# Patient Record
Sex: Male | Born: 1940 | Race: White | Hispanic: No | Marital: Married | Smoking: Former smoker
Health system: Southern US, Community
[De-identification: ages and names within clinical notes are randomized; demographics above are authoritative.]

## PROBLEM LIST (undated history)

## (undated) DIAGNOSIS — K449 Diaphragmatic hernia without obstruction or gangrene: Secondary | ICD-10-CM

## (undated) DIAGNOSIS — Z8719 Personal history of other diseases of the digestive system: Secondary | ICD-10-CM

## (undated) DIAGNOSIS — K2270 Barrett's esophagus without dysplasia: Secondary | ICD-10-CM

## (undated) DIAGNOSIS — E785 Hyperlipidemia, unspecified: Secondary | ICD-10-CM

## (undated) DIAGNOSIS — C4490 Unspecified malignant neoplasm of skin, unspecified: Secondary | ICD-10-CM

## (undated) DIAGNOSIS — Z9889 Other specified postprocedural states: Secondary | ICD-10-CM

## (undated) DIAGNOSIS — K635 Polyp of colon: Secondary | ICD-10-CM

## (undated) DIAGNOSIS — K219 Gastro-esophageal reflux disease without esophagitis: Secondary | ICD-10-CM

## (undated) DIAGNOSIS — Z923 Personal history of irradiation: Secondary | ICD-10-CM

## (undated) DIAGNOSIS — M1990 Unspecified osteoarthritis, unspecified site: Secondary | ICD-10-CM

## (undated) DIAGNOSIS — I219 Acute myocardial infarction, unspecified: Secondary | ICD-10-CM

## (undated) DIAGNOSIS — N200 Calculus of kidney: Secondary | ICD-10-CM

## (undated) DIAGNOSIS — I2510 Atherosclerotic heart disease of native coronary artery without angina pectoris: Secondary | ICD-10-CM

## (undated) DIAGNOSIS — C801 Malignant (primary) neoplasm, unspecified: Secondary | ICD-10-CM

## (undated) DIAGNOSIS — I10 Essential (primary) hypertension: Secondary | ICD-10-CM

## (undated) HISTORY — DX: Gastro-esophageal reflux disease without esophagitis: K21.9

## (undated) HISTORY — DX: Other specified postprocedural states: Z98.890

## (undated) HISTORY — DX: Atherosclerotic heart disease of native coronary artery without angina pectoris: I25.10

## (undated) HISTORY — DX: Diaphragmatic hernia without obstruction or gangrene: K44.9

## (undated) HISTORY — DX: Polyp of colon: K63.5

## (undated) HISTORY — PX: COLONOSCOPY: SHX174

## (undated) HISTORY — DX: Personal history of irradiation: Z92.3

## (undated) HISTORY — DX: Malignant (primary) neoplasm, unspecified: C80.1

## (undated) HISTORY — DX: Hyperlipidemia, unspecified: E78.5

## (undated) HISTORY — PX: EXTERNAL EAR SURGERY: SHX627

## (undated) HISTORY — DX: Barrett's esophagus without dysplasia: K22.70

## (undated) HISTORY — DX: Acute myocardial infarction, unspecified: I21.9

## (undated) HISTORY — PX: OTHER SURGICAL HISTORY: SHX169

## (undated) HISTORY — DX: Calculus of kidney: N20.0

## (undated) HISTORY — DX: Personal history of other diseases of the digestive system: Z87.19

## (undated) HISTORY — DX: Unspecified osteoarthritis, unspecified site: M19.90

---

## 2003-04-24 DIAGNOSIS — I219 Acute myocardial infarction, unspecified: Secondary | ICD-10-CM

## 2003-04-24 DIAGNOSIS — I2510 Atherosclerotic heart disease of native coronary artery without angina pectoris: Secondary | ICD-10-CM

## 2003-04-24 HISTORY — DX: Acute myocardial infarction, unspecified: I21.9

## 2003-04-24 HISTORY — DX: Atherosclerotic heart disease of native coronary artery without angina pectoris: I25.10

## 2003-04-24 HISTORY — PX: ANGIOPLASTY: SHX39

## 2005-01-29 ENCOUNTER — Ambulatory Visit: Payer: Self-pay | Admitting: Internal Medicine

## 2005-02-08 ENCOUNTER — Encounter (INDEPENDENT_AMBULATORY_CARE_PROVIDER_SITE_OTHER): Payer: Self-pay | Admitting: Specialist

## 2005-02-08 ENCOUNTER — Ambulatory Visit: Payer: Self-pay | Admitting: Internal Medicine

## 2005-02-08 ENCOUNTER — Encounter (INDEPENDENT_AMBULATORY_CARE_PROVIDER_SITE_OTHER): Payer: Self-pay | Admitting: *Deleted

## 2005-02-23 ENCOUNTER — Ambulatory Visit: Payer: Self-pay | Admitting: Internal Medicine

## 2005-02-23 ENCOUNTER — Ambulatory Visit (HOSPITAL_COMMUNITY): Admission: RE | Admit: 2005-02-23 | Discharge: 2005-02-23 | Payer: Self-pay | Admitting: Internal Medicine

## 2005-03-30 ENCOUNTER — Ambulatory Visit (HOSPITAL_COMMUNITY): Admission: RE | Admit: 2005-03-30 | Discharge: 2005-03-30 | Payer: Self-pay | Admitting: Internal Medicine

## 2005-03-30 ENCOUNTER — Ambulatory Visit: Payer: Self-pay | Admitting: Internal Medicine

## 2005-05-04 ENCOUNTER — Encounter (INDEPENDENT_AMBULATORY_CARE_PROVIDER_SITE_OTHER): Payer: Self-pay | Admitting: Specialist

## 2005-05-04 ENCOUNTER — Ambulatory Visit (HOSPITAL_COMMUNITY): Admission: RE | Admit: 2005-05-04 | Discharge: 2005-05-04 | Payer: Self-pay | Admitting: Internal Medicine

## 2005-05-04 ENCOUNTER — Ambulatory Visit: Payer: Self-pay | Admitting: Internal Medicine

## 2005-06-12 ENCOUNTER — Ambulatory Visit: Payer: Self-pay | Admitting: Internal Medicine

## 2006-06-12 ENCOUNTER — Ambulatory Visit: Payer: Self-pay | Admitting: Internal Medicine

## 2006-07-05 ENCOUNTER — Encounter (INDEPENDENT_AMBULATORY_CARE_PROVIDER_SITE_OTHER): Payer: Self-pay | Admitting: Specialist

## 2006-07-05 ENCOUNTER — Ambulatory Visit: Payer: Self-pay | Admitting: Internal Medicine

## 2006-11-21 ENCOUNTER — Other Ambulatory Visit: Payer: Self-pay

## 2006-11-22 ENCOUNTER — Inpatient Hospital Stay: Payer: Self-pay | Admitting: Internal Medicine

## 2006-11-22 ENCOUNTER — Other Ambulatory Visit: Payer: Self-pay

## 2006-11-23 ENCOUNTER — Other Ambulatory Visit: Payer: Self-pay

## 2006-11-24 ENCOUNTER — Other Ambulatory Visit: Payer: Self-pay

## 2006-12-17 ENCOUNTER — Inpatient Hospital Stay: Payer: Self-pay | Admitting: Cardiovascular Disease

## 2006-12-17 ENCOUNTER — Other Ambulatory Visit: Payer: Self-pay

## 2008-02-04 ENCOUNTER — Ambulatory Visit: Payer: Self-pay

## 2008-03-25 ENCOUNTER — Telehealth: Payer: Self-pay | Admitting: Internal Medicine

## 2008-04-01 ENCOUNTER — Encounter (INDEPENDENT_AMBULATORY_CARE_PROVIDER_SITE_OTHER): Payer: Self-pay | Admitting: *Deleted

## 2008-04-01 DIAGNOSIS — K449 Diaphragmatic hernia without obstruction or gangrene: Secondary | ICD-10-CM | POA: Insufficient documentation

## 2008-04-01 DIAGNOSIS — Z85828 Personal history of other malignant neoplasm of skin: Secondary | ICD-10-CM

## 2008-04-01 DIAGNOSIS — Z87442 Personal history of urinary calculi: Secondary | ICD-10-CM

## 2008-04-01 DIAGNOSIS — K2270 Barrett's esophagus without dysplasia: Secondary | ICD-10-CM

## 2008-04-01 DIAGNOSIS — K209 Esophagitis, unspecified without bleeding: Secondary | ICD-10-CM | POA: Insufficient documentation

## 2008-04-02 ENCOUNTER — Ambulatory Visit: Payer: Self-pay | Admitting: Internal Medicine

## 2008-04-02 DIAGNOSIS — K219 Gastro-esophageal reflux disease without esophagitis: Secondary | ICD-10-CM | POA: Insufficient documentation

## 2008-04-02 DIAGNOSIS — Z860100 Personal history of colon polyps, unspecified: Secondary | ICD-10-CM | POA: Insufficient documentation

## 2008-04-02 DIAGNOSIS — I2510 Atherosclerotic heart disease of native coronary artery without angina pectoris: Secondary | ICD-10-CM | POA: Insufficient documentation

## 2008-06-17 ENCOUNTER — Ambulatory Visit: Payer: Self-pay | Admitting: Otolaryngology

## 2008-06-24 ENCOUNTER — Encounter (INDEPENDENT_AMBULATORY_CARE_PROVIDER_SITE_OTHER): Payer: Self-pay | Admitting: *Deleted

## 2008-07-09 ENCOUNTER — Ambulatory Visit: Payer: Self-pay | Admitting: Otolaryngology

## 2008-07-15 ENCOUNTER — Ambulatory Visit: Payer: Self-pay | Admitting: Otolaryngology

## 2008-11-02 ENCOUNTER — Telehealth: Payer: Self-pay | Admitting: Internal Medicine

## 2008-11-02 ENCOUNTER — Encounter: Payer: Self-pay | Admitting: Internal Medicine

## 2008-11-19 ENCOUNTER — Ambulatory Visit: Payer: Self-pay | Admitting: Otolaryngology

## 2008-11-26 ENCOUNTER — Ambulatory Visit: Payer: Self-pay | Admitting: Otolaryngology

## 2008-12-10 ENCOUNTER — Ambulatory Visit: Payer: Self-pay | Admitting: Internal Medicine

## 2008-12-20 ENCOUNTER — Ambulatory Visit: Payer: Self-pay | Admitting: Internal Medicine

## 2008-12-20 ENCOUNTER — Encounter: Payer: Self-pay | Admitting: Internal Medicine

## 2008-12-21 ENCOUNTER — Encounter: Payer: Self-pay | Admitting: Internal Medicine

## 2009-02-03 ENCOUNTER — Ambulatory Visit: Payer: Self-pay | Admitting: Otolaryngology

## 2009-04-23 DIAGNOSIS — Z923 Personal history of irradiation: Secondary | ICD-10-CM

## 2009-04-23 HISTORY — DX: Personal history of irradiation: Z92.3

## 2009-09-17 ENCOUNTER — Telehealth (INDEPENDENT_AMBULATORY_CARE_PROVIDER_SITE_OTHER): Payer: Self-pay | Admitting: Physician Assistant

## 2009-10-21 ENCOUNTER — Ambulatory Visit: Payer: Self-pay | Admitting: Radiation Oncology

## 2009-11-08 ENCOUNTER — Ambulatory Visit: Payer: Self-pay | Admitting: Radiation Oncology

## 2009-11-09 ENCOUNTER — Ambulatory Visit: Payer: Self-pay | Admitting: Otolaryngology

## 2009-11-10 ENCOUNTER — Ambulatory Visit: Payer: Self-pay | Admitting: Otolaryngology

## 2009-11-14 LAB — PATHOLOGY REPORT

## 2009-11-21 ENCOUNTER — Ambulatory Visit: Payer: Self-pay | Admitting: Radiation Oncology

## 2009-11-28 ENCOUNTER — Ambulatory Visit: Payer: Self-pay | Admitting: Radiation Oncology

## 2009-12-22 ENCOUNTER — Ambulatory Visit: Payer: Self-pay | Admitting: Radiation Oncology

## 2010-01-21 ENCOUNTER — Ambulatory Visit: Payer: Self-pay | Admitting: Radiation Oncology

## 2010-02-21 ENCOUNTER — Ambulatory Visit: Payer: Self-pay | Admitting: Radiation Oncology

## 2010-03-23 ENCOUNTER — Ambulatory Visit: Payer: Self-pay | Admitting: Radiation Oncology

## 2010-03-23 ENCOUNTER — Ambulatory Visit: Payer: Self-pay | Admitting: Oncology

## 2010-04-23 ENCOUNTER — Ambulatory Visit: Payer: Self-pay | Admitting: Radiation Oncology

## 2010-04-23 ENCOUNTER — Ambulatory Visit: Payer: Self-pay | Admitting: Oncology

## 2010-05-08 ENCOUNTER — Other Ambulatory Visit: Payer: Self-pay | Admitting: Dermatology

## 2010-05-24 HISTORY — PX: NECK DISSECTION: SUR422

## 2010-05-25 NOTE — Progress Notes (Signed)
  Phone Note Call from Patient   Call For: Dr Simona Huh Reason for Call: Talk to Doctor Details for Reason: Letter from hospital  Summary of Call: Pt received a letter from [facility] asking him questions about his heart failure. He didn't know he had heart failure and was concerned. He had also had CP after exertion on right. Reassured him that cath was without sig. blockage. Reassured him that EF was nl and no diastolic dysfunction on echo. I reassured him that I cannot find a reason for him to have gotten that letter. He was very anxious about it. Reassured him. No further questions. Initial call taken by: Lavella Hammock Barrett PA-C,  Sep 17, 2009 12:50 PM     Appended Document:  It appears that this note was sent to me in error.  I was not the physician on call on May 28 when this phone call was taken by Ms. Barrett.  EMR review does not show any cardiology office visits for this patient, and for that matter I do not see any cardiology evaluation at [facility].  There is a GI note by Dr. Marina Goodell from 12/09 indicating that the patient had a coronary stent placed in April 2009, presumably under the care of Dr. Harold Hedge at the [facility] in [place] based on the cc list.  I placed a call to our [place] office on 6/1 and asked Harriett Sine, the office administrator, to check with that practice, and relay this information to them for purposes of followup.  I also attempted to reach Ms. Raford Pitcher to ask her more about the call, however she is on vacation this week.  Appended Document:  Note was put on the wrong patient. Should have been Brandon Clements [birth year] (also 46), not Brandon Clements and to Dr Dietrich Pates, not Dr Diona Browner. Sorry for the confusion.

## 2010-08-23 ENCOUNTER — Ambulatory Visit: Payer: Self-pay | Admitting: Otolaryngology

## 2010-08-24 ENCOUNTER — Ambulatory Visit: Payer: Self-pay | Admitting: Otolaryngology

## 2010-08-28 LAB — PATHOLOGY REPORT

## 2010-08-31 ENCOUNTER — Ambulatory Visit: Payer: Self-pay | Admitting: Oncology

## 2010-09-08 NOTE — Assessment & Plan Note (Signed)
[facility]                         GASTROENTEROLOGY OFFICE NOTE   STYLES, FAMBRO                      MRN:          [id number]  DATE:06/12/2006                            DOB:          1940/11/19    HISTORY:  This is a 70 year old with a history of gastroesophageal  reflux disease complicated by long segment Barrett's esophagus, as well  as a symptomatic peptic stricture, for which he underwent upper  endoscopy with esophageal dilation in January 2007. Also, multiple  esophageal biopsies confirming Barrett's esophagus with no evidence of  dysplasia. He was strictly advised to continue on daily proton pump  inhibitor therapy and follow up in one year for surveillance endoscopy.  He had also been advised to discontinue tobacco use and curb alcohol  use. The patient is accompanied today by his wife. He complains of  intermittent reflux symptoms, though he has been quite non-compliant  with his Omeprazole therapy. He takes Omeprazole 40 mg daily,  approximately twice per week. He denies recurrent dysphagia. No other  active symptoms.   PHYSICAL EXAMINATION:  GENERAL: Finds a well appearing male in no acute  distress.  VITAL SIGNS: Blood pressure 136/74, heart rate 80, weight 236 pounds.  HEENT: Sclerae anicteric, conjunctiva are pink, oral mucosa is intact,  no adenopathy.  LUNGS: Clear.  HEART: Regular.  ABDOMEN: Soft without tenderness, mass, or hernia.   IMPRESSION:  1. Gastroesophageal reflux disease complicated by Barrett's esophagus      and peptic stricture.  2. Non-compliance with proton pump inhibitor therapy.  3. Ongoing tobacco use.   RECOMMENDATIONS:  1. Schedule upper endoscopy with surveillance biopsies.  2. Strict compliance with proton pump inhibitor therapy, as well as      discontinuation of tobacco use stressed.  3. Ongoing general medical care with Dr. Judithann Sheen.     Wilhemina Bonito. Marina Goodell, MD  Electronically Signed    JNP/MedQ  DD: 06/12/2006  DT: 06/13/2006  Job #: [id number]   cc:   Marguarite Arbour, MD

## 2010-09-22 ENCOUNTER — Ambulatory Visit: Payer: Self-pay | Admitting: Oncology

## 2010-09-22 ENCOUNTER — Ambulatory Visit: Payer: Self-pay | Admitting: Otolaryngology

## 2010-10-05 ENCOUNTER — Ambulatory Visit: Payer: Self-pay | Admitting: Otolaryngology

## 2010-10-06 LAB — PATHOLOGY REPORT

## 2010-10-11 ENCOUNTER — Ambulatory Visit: Payer: Self-pay

## 2010-10-11 LAB — PATHOLOGY REPORT

## 2010-10-13 ENCOUNTER — Ambulatory Visit: Payer: Self-pay

## 2010-10-22 ENCOUNTER — Ambulatory Visit: Payer: Self-pay | Admitting: Oncology

## 2010-10-22 HISTORY — PX: RADICAL NECK DISSECTION: SHX2284

## 2011-01-12 ENCOUNTER — Ambulatory Visit: Payer: Self-pay | Admitting: Otolaryngology

## 2011-07-13 ENCOUNTER — Ambulatory Visit: Payer: Self-pay | Admitting: Otolaryngology

## 2011-12-04 ENCOUNTER — Other Ambulatory Visit: Payer: Self-pay | Admitting: Dermatology

## 2011-12-04 ENCOUNTER — Encounter: Payer: Self-pay | Admitting: Internal Medicine

## 2011-12-07 ENCOUNTER — Encounter: Payer: Self-pay | Admitting: Internal Medicine

## 2012-01-08 ENCOUNTER — Telehealth: Payer: Self-pay | Admitting: *Deleted

## 2012-01-08 NOTE — Telephone Encounter (Signed)
Pt scheduled for previsit Thursday 01/10/12, scheduled for colon 01/29/12. Noted during chart prep that patient is also due for an endoscopy. Lorene Dy attempted to call patient to schedule ECL, a man answered and said the patient was on vacation and to call back and leave a message, which she did. No return phone call from patient. When pt arrives for previsit, need to change colon for ECL if patient desires.

## 2012-01-10 ENCOUNTER — Encounter: Payer: Self-pay | Admitting: Internal Medicine

## 2012-01-10 ENCOUNTER — Ambulatory Visit (AMBULATORY_SURGERY_CENTER): Payer: Medicare Other | Admitting: *Deleted

## 2012-01-10 VITALS — Ht 69.0 in | Wt 205.2 lb

## 2012-01-10 DIAGNOSIS — K2270 Barrett's esophagus without dysplasia: Secondary | ICD-10-CM

## 2012-01-10 NOTE — Progress Notes (Signed)
Pt due for recall EGD for Barrett's Esophagus surveillance.  Last EGD 2010  Here today for PV.  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  Pt says that he can tell that his "neck is not straight like it used to be".  Pt scheduled for new patient appt with Dr. Marina Goodell 10/18.  EGD scheduled for 10/8 cancelled.

## 2012-01-29 ENCOUNTER — Encounter: Payer: Self-pay | Admitting: Internal Medicine

## 2012-02-08 ENCOUNTER — Encounter: Payer: Self-pay | Admitting: Internal Medicine

## 2012-02-08 ENCOUNTER — Ambulatory Visit (INDEPENDENT_AMBULATORY_CARE_PROVIDER_SITE_OTHER): Payer: Medicare Other | Admitting: Internal Medicine

## 2012-02-08 VITALS — BP 124/74 | HR 66 | Ht 69.0 in | Wt 207.0 lb

## 2012-02-08 DIAGNOSIS — K2270 Barrett's esophagus without dysplasia: Secondary | ICD-10-CM

## 2012-02-08 DIAGNOSIS — C760 Malignant neoplasm of head, face and neck: Secondary | ICD-10-CM

## 2012-02-08 DIAGNOSIS — K219 Gastro-esophageal reflux disease without esophagitis: Secondary | ICD-10-CM

## 2012-02-08 DIAGNOSIS — Z86010 Personal history of colonic polyps: Secondary | ICD-10-CM

## 2012-02-08 NOTE — Patient Instructions (Addendum)
We will change the recall to date for your endoscopy to coincide with the recall date for your colonoscopy, which is 12/22/2013.

## 2012-02-08 NOTE — Progress Notes (Signed)
HISTORY OF PRESENT ILLNESS:  Brandon Clements is a 71 y.o. male with coronary artery disease and hyperlipidemia. He is followed in this office for GERD complicated by peptic stricture and long segment Barrett's esophagus. As well, adenomatous colon polyps. He's undergone prior colonoscopies and upper endoscopies. His last examinations were performed in August of 2010. Colonoscopy with diminutive adenoma. Upper endoscopy with non-dysplastic Barrett's. Followup colonoscopy in 5 years and followup EGD in 3 years recommended. 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 He recently received a recall letter for Barrett surveillance. Because of the head and neck cancer surgery the previsit was changed to an office visit with myself. Patient continues on PPI. He denies heartburn. No dysphagia. He lost significant weight, but was able to gain it back to baseline. He is accompanied by his wife. GI review of systems is negative. No longer on Plavix.  REVIEW OF SYSTEMS:  All non-GI ROS negative except for arthritis  Past Medical History  Diagnosis Date  . CAD (coronary artery disease) 2005    angioplasty with 3 stents  . Myocardial infarction 2005  . Barrett's esophagus   . GERD (gastroesophageal reflux disease)   . 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 Arthritis   . Hyperlipidemia     Past Surgical History  Procedure Date  . Radical neck dissection July 2012    left neck, jaw, and ear  . Neck dissection Feb 2012    right  . Angioplasty 2005    3 stents  . Colonoscopy 2010, 2006    hx adenomatous polyps 2006/ Dr. Marina Goodell  . Cancer sugery     Skin cancer    Social History [employer]  reports that he quit smoking about 47 years ago. His smokeless tobacco use  includes Chew. He reports that he drinks about 8.4 ounces of alcohol per week. He reports that he does not use illicit drugs.  family history is negative for Colon cancer and Stomach cancer.  No Known Allergies     PHYSICAL EXAMINATION: Vital signs: BP 124/74  Pulse 66  Ht 5\' 9"  (1.753 m)  Wt 207 lb (93.895 kg)  BMI 30.57 kg/m2 General: Well-developed, well-nourished, no acute distress HEENT: Sclerae are anicteric, conjunctiva pink. Oral mucosa intact. The deviation of the tongue and pharyngeal structures to the left. Lungs: Clear Heart: Regular Abdomen: soft, nontender, nondistended, no obvious ascites, no peritoneal signs, normal bowel sounds. No organomegaly. Extremities: No edema Psychiatric: alert and oriented x3. Cooperative     ASSESSMENT:  #1. GERD complicated by Barrett's esophagus. Last endoscopy with biopsies 3 years ago revealing non-dysplastic Barrett's #2. History of adenomatous colon polyps. Last colonoscopy 3 years ago #3. Multiple medical problems   PLAN:  #1. We discussed the relative risk of esophageal cancer with Barrett's esophagus. Given this, his problems related to head and neck surgery, and lack of relevant GI symptoms, we have mutually elected to defer his upper endoscopy for additional 2 years. We can perform the examination at the time of his surveillance colonoscopy, assuming is medically fit. We would prefer, and will, perform this at the hospital with anesthesia supervision to maximize airway protection if needed. However, he knows to contact this office in  the interim for any GI problems or questions. He will continue PPI and resume his general medical care with Dr. Judithann Sheen

## 2012-10-13 ENCOUNTER — Other Ambulatory Visit: Payer: Self-pay | Admitting: Dermatology

## 2013-11-03 ENCOUNTER — Other Ambulatory Visit: Payer: Self-pay | Admitting: Dermatology

## 2013-11-21 DIAGNOSIS — C4490 Unspecified malignant neoplasm of skin, unspecified: Secondary | ICD-10-CM

## 2013-11-21 HISTORY — DX: Unspecified malignant neoplasm of skin, unspecified: C44.90

## 2013-11-24 ENCOUNTER — Other Ambulatory Visit: Payer: Self-pay | Admitting: Dermatology

## 2013-12-14 ENCOUNTER — Encounter: Payer: Self-pay | Admitting: Radiation Oncology

## 2013-12-14 NOTE — Progress Notes (Signed)
Histology and Location of Primary Skin Cancer: right frontal scalp 11/03/13  Diagnosis 1. Skin Biopsy-(P), (A) right parietal medial scalp, shave MODERATELY DIFFERENTIATED SQUAMOUS CELL CARCINOMA 2. Skin Biopsy-(P), (B) left parietal scalp, shave WELL DIFFERENTIATED SQUAMOUS CELL CARCINOMA 3. Skin Biopsy-(P), (C) right lateral parietal scalp, shave MODERATELY DIFFERENTIATED SQUAMOUS CELL CARCINOMA, ADENOID VARIANT 4. Skin Biopsy-(P), (D) right frontal scalp, shave POORLY DIFFERENTIATED SQUAMOUS CELL CARCINOMA, SCLEROSING VARIANT  11/24/13 Diagnosis Skin Biopsy-(P), (A) right frontal scalp, excision RESIDUAL SQUAMOUS CELL CARCINOMA IN SITU, MARGINS FREE  Marshell Levan presented with the following signs/symptoms,   Recurrent squamous cell carcinoma 53month ago  Past/Anticipated interventions by patient's surgeon/dermatologist for current problematic lesion, if any: right frontal scalp  Past skin cancers, if any:      SCC left ear- left ear removed July 2012  1) Location/Histology/Intervention: 2012 SCCIS left temple- treatment included E, D & C  2) Location/Histology/Intervention: SCCIS right nose- E, D & C  3) Location/Histology/Intervention: SCC left ala, Moh's  4) Location/Histology/Intervention: basosquamous on right cheek, Moh's  5) Location/Histology/Intervention: SCC right parietal scalp, Moh's  History of Blistering sunburns, if any: yes, history of significant sun exposure [place] up [place] farm, no sunbathing/tanning beds  SAFETY ISSUES:  Prior radiation? YES, [facility], 2011 to left cheek for squamous cell skin cancer. This was prior to left neck surgery and radical neck dissection in 2012.  Pacemaker/ICD? no  Possible current pregnancy? na  Is the patient on methotrexate? no  Current Complaints / other details:  Married, retired from [employer]

## 2013-12-16 ENCOUNTER — Ambulatory Visit
Admission: RE | Admit: 2013-12-16 | Discharge: 2013-12-16 | Disposition: A | Discharge status: Home / Self Care | Payer: Medicare Other | Source: Ambulatory Visit | Attending: Radiation Oncology | Admitting: Radiation Oncology

## 2013-12-16 ENCOUNTER — Encounter: Payer: Self-pay | Admitting: Radiation Oncology

## 2013-12-16 VITALS — BP 110/61 | HR 66 | Temp 97.9°F | Resp 20 | Ht 69.0 in | Wt 207.4 lb

## 2013-12-16 DIAGNOSIS — Z9861 Coronary angioplasty status: Secondary | ICD-10-CM | POA: Diagnosis not present

## 2013-12-16 DIAGNOSIS — I2510 Atherosclerotic heart disease of native coronary artery without angina pectoris: Secondary | ICD-10-CM | POA: Diagnosis not present

## 2013-12-16 DIAGNOSIS — C779 Secondary and unspecified malignant neoplasm of lymph node, unspecified: Secondary | ICD-10-CM

## 2013-12-16 DIAGNOSIS — Z7982 Long term (current) use of aspirin: Secondary | ICD-10-CM | POA: Insufficient documentation

## 2013-12-16 DIAGNOSIS — Z860100 Personal history of colon polyps, unspecified: Secondary | ICD-10-CM | POA: Insufficient documentation

## 2013-12-16 DIAGNOSIS — C50919 Malignant neoplasm of unspecified site of unspecified female breast: Secondary | ICD-10-CM | POA: Insufficient documentation

## 2013-12-16 DIAGNOSIS — I10 Essential (primary) hypertension: Secondary | ICD-10-CM | POA: Insufficient documentation

## 2013-12-16 DIAGNOSIS — C4442 Squamous cell carcinoma of skin of scalp and neck: Secondary | ICD-10-CM | POA: Insufficient documentation

## 2013-12-16 DIAGNOSIS — Z510 Encounter for antineoplastic radiation therapy: Secondary | ICD-10-CM | POA: Insufficient documentation

## 2013-12-16 DIAGNOSIS — K219 Gastro-esophageal reflux disease without esophagitis: Secondary | ICD-10-CM | POA: Insufficient documentation

## 2013-12-16 DIAGNOSIS — F17200 Nicotine dependence, unspecified, uncomplicated: Secondary | ICD-10-CM | POA: Insufficient documentation

## 2013-12-16 DIAGNOSIS — Z79899 Other long term (current) drug therapy: Secondary | ICD-10-CM | POA: Insufficient documentation

## 2013-12-16 DIAGNOSIS — I252 Old myocardial infarction: Secondary | ICD-10-CM | POA: Diagnosis not present

## 2013-12-16 HISTORY — DX: Essential (primary) hypertension: I10

## 2013-12-16 HISTORY — DX: Unspecified malignant neoplasm of skin, unspecified: C44.90

## 2013-12-16 NOTE — Progress Notes (Signed)
Please see the Nurse Progress Note in the MD Initial Consult Encounter for this patient. 

## 2013-12-16 NOTE — Progress Notes (Signed)
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 male who is seen today through the courtesy of Dr. Sarajane Jews for consideration of postoperative radiation therapy in the management of his locally advanced squamous cell carcinoma arising from the right parietal scalp. The patient has an extensive history of squamous cell carcinomas arising from the face, primarily treated surgically, however he did have a course of radiation therapy by Dr. Baruch Gouty at the [facility] to his left cheek/face in 2011. Of note is that he underwent an extensive resection at [facility] for a locally advanced squamous cell carcinoma arising from his left face/periauricular region in 2012. More recently, he was noted to have an ulcerative lesion along his "right parietal medial scalp" which was biopsied on 11/03/2013 and found to represent moderately differentiated squamous cell carcinoma. He also had biopsies of a "left parietal scalp" lesion diagnostic for well-differentiated squamous cell carcinoma, a "right lateral parietal scalp" lesion representing moderately differentiated squamous cell carcinoma, adenoid variant and and also a "right frontal scalp" lesion representing a poorly differentiated squamous cell carcinoma, sclerosing variant. He underwent a 3 stage Mohs surgery for the right parietal medial scalp lesion which measured 2.8 x 1.9 cm. A postoperative defect was 3.5 x 2.8 cm. There was perineural invasion seen after the first and second stages. His complete medical records not available  at this time of this dictation but it appears that he also had Mohs surgery for the lateral parietal scalp (posterior) carcinomas well. These 2 defects were closed with a complex repair. The final wound length measured 11 cm. My understanding is that he had excision of left parietal scalp, and I frontal scalp lesions on August 4. He is doing well postoperatively and has had excellent healing.  PREVIOUS RADIATION THERAPY: Yes. Records are pending from Dr. Baruch Gouty.   PAST MEDICAL HISTORY:  has a past medical history of CAD (coronary artery disease) (2005); Barrett's esophagus; GERD (gastroesophageal reflux disease); Cancer; Arthritis; Hyperlipidemia; Skin cancer (11/2013); Hypertension; and Myocardial infarction (2005).     PAST SURGICAL HISTORY:  Past Surgical History  Procedure Laterality Date  . Radical neck dissection  July 2012    left neck, jaw, and ear  . Neck dissection  Feb 2012    right  . Angioplasty  2005    3 stents  . Colonoscopy  2010, 2006    hx adenomatous polyps 2006/ Dr. Henrene Pastor  . Cancer sugery      Skin cancer  . External ear surgery Left     left ear removal     FAMILY HISTORY: family history includes Alzheimer's disease in his mother; Cancer in his father; Skin cancer in his father. There is no history of Colon cancer or Stomach cancer.    SOCIAL HISTORY:  reports that he quit smoking about 48 years ago. His smokeless tobacco use includes Chew. He reports that he drinks about 8.4 ounces of alcohol per week. He reports that he does not use illicit drugs.  Married, 2 children. He grew up on  a farm and had significant sun exposure.  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   ALLERGIES: Review of patient's allergies indicates no known allergies.   MEDICATIONS:  Current Outpatient Prescriptions  Medication Sig Dispense Refill  . aspirin 81 MG tablet Take 81 mg by mouth daily.      . fluorouracil (EFUDEX) 5 % cream Apply topically 2 (two) times daily.       . metoprolol tartrate (LOPRESSOR) 25 MG tablet Take 25 mg by mouth 2 (two) times daily.       . pantoprazole (PROTONIX) 40 MG tablet Take 40 mg by mouth daily.       . simvastatin (ZOCOR) 20 MG tablet Take 20 mg by mouth daily.       Marland Kitchen triamcinolone cream (KENALOG) 0.1 % Apply 1 application topically 2 (two) times daily.       . fluticasone (FLONASE) 50 MCG/ACT nasal spray Place into the nose.      . Multiple Vitamin (MULTIVITAMIN) tablet Take 1 tablet by mouth daily.       No current facility-administered medications for this encounter.     REVIEW OF SYSTEMS:  Pertinent items are noted in HPI.    PHYSICAL EXAM:  height is 5\' 9"  (1.753 m) and weight is 207 lb 6.4 oz (94.076 kg). His oral temperature is 97.9 F (36.6 C). His blood pressure is 110/61 and his pulse is 66. His respiration is 20.   Alert and oriented 73 year old white male appearing his stated age. He has an obvious soft tissue graft along his left ear/periauricular region. There is no palpable facial or periauricular lymphadenopathy. There is an 11 cm well-healed wound along the right frontoparietal scalp, and 4 cm well-healed wounds along the left, and right frontal scalp (total of 3 scars). No visible or palpable evidence for recurrent disease.   LABORATORY DATA:  No results found for this basename: WBC, HGB, HCT, MCV, PLT   No results found for this basename: NA, K, CL, CO2   No results found for this basename: ALT, AST, GGT, ALKPHOS, BILITOT      IMPRESSION: Moderately differentiated squamous cell carcinoma with perineural invasion from a primary along the right parietal medial scalp. When there is greater than 0.1 mm of no invasion, there is increased risk for a local recurrence. She requested been reviewed. I plan to review and confirm my observations with Dr. Sarajane Jews. I assume that the left parietal scalp (posterior) and right frontal scalp carcinomas are taking care of and do not need postoperative radiation  therapy. We typically delivered between 4-6 weeks of postoperative radiation therapy (per NCCN guidelines). The patient to return within the next few weeks for CT simulation/treatment planning. We reviewed the potential acute and late toxicities of radiation therapy which should be well tolerated. I will also obtain his records from [facility] just to make sure that there is no overlap with his previous left zygomatic/periaricular the field of radiation (this should not be the case)   PLAN: As discussed above. I'll review his presentation, findings, and potential field of radiation with Dr. Sarajane Jews. I spent 45 minutes face to face with the patient and more than 50% of that time was spent in counseling and/or coordination of care.

## 2013-12-17 ENCOUNTER — Encounter: Payer: Self-pay | Admitting: *Deleted

## 2013-12-17 ENCOUNTER — Encounter: Payer: Self-pay | Admitting: Radiation Oncology

## 2013-12-17 NOTE — Progress Notes (Signed)
Chart note: I spoke with Dr. Sarajane Jews earlier today, and also received records are previous radiation therapy with Dr. Baruch Gouty from [facility] he received an abbreviated course of radiation therapy to his right cheek with 9 MV electrons. 2400 cGy in 12 fractions completing this on 10/03/10. This was terminated because of planned surgery along his left parotid region. He receive 6600 cGy 33 sessions with IMRT along his left parotid bed completing this therapy on 02/08/2010. There will not be any overlap with his proposed scalp field. Dr. Sarajane Jews indicated that the perineural extension was along the right anterior aspect of his right parietal medial scalp lesion. We feel that a 2- 3 cm margin should be adequate. There is no need to encompass the other lesions were treat the entire surgical scar. I anticipate a 4 week course of postoperative radiation therapy. I spoke with the patient this afternoon.

## 2013-12-21 ENCOUNTER — Ambulatory Visit
Admission: RE | Admit: 2013-12-21 | Discharge: 2013-12-21 | Disposition: A | Discharge status: Home / Self Care | Payer: Medicare Other | Source: Ambulatory Visit | Attending: Radiation Oncology | Admitting: Radiation Oncology

## 2013-12-21 DIAGNOSIS — Z510 Encounter for antineoplastic radiation therapy: Secondary | ICD-10-CM | POA: Diagnosis not present

## 2013-12-21 DIAGNOSIS — C4442 Squamous cell carcinoma of skin of scalp and neck: Secondary | ICD-10-CM

## 2013-12-21 NOTE — Progress Notes (Addendum)
Complex simulation/treatment planning note: The patient was taken to the CT simulator. After careful review of his surgical findings and discussion with Dr. Sarajane Jews, I marked out his treatment and along the right scalp. I used a green marker to mark out his surgical scar for future reference in the event that he needs further radiation therapy to another region of the scalp. The treatment area it was outlined with a radiopaque wire. Photographs were obtained. 1.0 cm custom bolus was applied to the skin and a custom head cast was constructed. He was then scanned. I chose an isocenter along the center of the field. He is now ready for [facility] calculation/electron beam dosimetry. He will receive 5000 cGy in 20 sessions over 4 weeks. A special port is requested.

## 2013-12-22 ENCOUNTER — Encounter: Payer: Self-pay | Admitting: Radiation Oncology

## 2013-12-22 DIAGNOSIS — Z510 Encounter for antineoplastic radiation therapy: Secondary | ICD-10-CM | POA: Diagnosis not present

## 2013-12-22 NOTE — Addendum Note (Signed)
Encounter addended by: Andria Rhein, RN on: 12/22/2013  9:16 AM<BR>     Documentation filed: Charges VN, Visit Diagnoses

## 2013-12-22 NOTE — Progress Notes (Signed)
Chart note: The patient completed his electron beam planning today for treatment to his scalp. He is set up en face. One custom block is constructed to conform the field. I chose the 93% isodose curve to deliver 5000 cGy in 20 sessions with 6 MEV electrons. He underwent Monte Carlo calculation. A special port plan/isodose plan is reviewed and excepted.

## 2013-12-30 ENCOUNTER — Ambulatory Visit
Admission: RE | Admit: 2013-12-30 | Discharge: 2013-12-30 | Disposition: A | Discharge status: Home / Self Care | Payer: Medicare Other | Source: Ambulatory Visit | Attending: Radiation Oncology | Admitting: Radiation Oncology

## 2013-12-30 DIAGNOSIS — Z510 Encounter for antineoplastic radiation therapy: Secondary | ICD-10-CM | POA: Diagnosis not present

## 2013-12-31 ENCOUNTER — Ambulatory Visit
Admission: RE | Admit: 2013-12-31 | Discharge: 2013-12-31 | Disposition: A | Discharge status: Home / Self Care | Payer: Medicare Other | Source: Ambulatory Visit | Attending: Radiation Oncology | Admitting: Radiation Oncology

## 2013-12-31 DIAGNOSIS — Z510 Encounter for antineoplastic radiation therapy: Secondary | ICD-10-CM | POA: Diagnosis not present

## 2014-01-01 ENCOUNTER — Ambulatory Visit
Admission: RE | Admit: 2014-01-01 | Discharge: 2014-01-01 | Disposition: A | Discharge status: Home / Self Care | Payer: Medicare Other | Source: Ambulatory Visit | Attending: Radiation Oncology | Admitting: Radiation Oncology

## 2014-01-01 DIAGNOSIS — Z510 Encounter for antineoplastic radiation therapy: Secondary | ICD-10-CM | POA: Diagnosis not present

## 2014-01-01 NOTE — Progress Notes (Addendum)
Routine of clinic reviewed.doctor day on Monday after radiation treatment.Reviewed possible side effects to include fatigue,  discoloration of scalp, redness and tenderness.Told to wear sunscreen and hat when out working in yard.Drink water for hydration and that we would give him gel/cream as he experiences skin changes.Patient able to verbalize back instruction per teach back method.Given Radiation Therapy and You Booklet and informed to read all information but special attention to the turned down pages.

## 2014-01-04 ENCOUNTER — Ambulatory Visit
Admission: RE | Admit: 2014-01-04 | Discharge: 2014-01-04 | Disposition: A | Discharge status: Home / Self Care | Payer: Medicare Other | Source: Ambulatory Visit | Attending: Radiation Oncology | Admitting: Radiation Oncology

## 2014-01-04 ENCOUNTER — Encounter: Payer: Self-pay | Admitting: Radiation Oncology

## 2014-01-04 VITALS — BP 127/72 | HR 64 | Resp 16 | Wt 207.5 lb

## 2014-01-04 DIAGNOSIS — C4442 Squamous cell carcinoma of skin of scalp and neck: Secondary | ICD-10-CM

## 2014-01-04 DIAGNOSIS — Z510 Encounter for antineoplastic radiation therapy: Secondary | ICD-10-CM | POA: Diagnosis not present

## 2014-01-04 MED ORDER — BIAFINE EX EMUL
Freq: Every day | CUTANEOUS | Status: DC
  Administered 2014-01-04: 12:00:00 via TOPICAL

## 2014-01-04 NOTE — Progress Notes (Signed)
Weekly Management Note:  Site: Right scalp Current Dose:  1000  cGy Projected Dose: 5000  cGy  Narrative: The patient is seen today for routine under treatment assessment. CBCT/MVCT images/port films were reviewed. The chart was reviewed.   He is without complaints today. He was given Biafine cream to use when necessary.  Physical Examination:  Filed Vitals:   01/04/14 1145  BP: 127/72  Pulse: 64  Resp: 16  .  Weight: 207 lb 8 oz (94.121 kg). There are no significant skin changes along the scalp.  Impression: Tolerating radiation therapy well.  Plan: Continue radiation therapy as planned.

## 2014-01-04 NOTE — Progress Notes (Signed)
Provided patient with biafine cream and directed upon use. Patient verbalized understanding. Weight and vitals stable. Patient denies pain. No skin changes noted to right scalp. Denies headache, dizziness, nausea or vomiting. REports fatigue.

## 2014-01-05 ENCOUNTER — Ambulatory Visit
Admission: RE | Admit: 2014-01-05 | Discharge: 2014-01-05 | Disposition: A | Discharge status: Home / Self Care | Payer: Medicare Other | Source: Ambulatory Visit | Attending: Radiation Oncology | Admitting: Radiation Oncology

## 2014-01-05 DIAGNOSIS — Z510 Encounter for antineoplastic radiation therapy: Secondary | ICD-10-CM | POA: Diagnosis not present

## 2014-01-06 ENCOUNTER — Ambulatory Visit
Admission: RE | Admit: 2014-01-06 | Discharge: 2014-01-06 | Disposition: A | Discharge status: Home / Self Care | Payer: Medicare Other | Source: Ambulatory Visit | Attending: Radiation Oncology | Admitting: Radiation Oncology

## 2014-01-06 DIAGNOSIS — Z510 Encounter for antineoplastic radiation therapy: Secondary | ICD-10-CM | POA: Diagnosis not present

## 2014-01-07 ENCOUNTER — Ambulatory Visit
Admission: RE | Admit: 2014-01-07 | Discharge: 2014-01-07 | Disposition: A | Discharge status: Home / Self Care | Payer: Medicare Other | Source: Ambulatory Visit | Attending: Radiation Oncology | Admitting: Radiation Oncology

## 2014-01-07 DIAGNOSIS — Z510 Encounter for antineoplastic radiation therapy: Secondary | ICD-10-CM | POA: Diagnosis not present

## 2014-01-08 ENCOUNTER — Ambulatory Visit
Admission: RE | Admit: 2014-01-08 | Discharge: 2014-01-08 | Disposition: A | Discharge status: Home / Self Care | Payer: Medicare Other | Source: Ambulatory Visit | Attending: Radiation Oncology | Admitting: Radiation Oncology

## 2014-01-08 DIAGNOSIS — Z510 Encounter for antineoplastic radiation therapy: Secondary | ICD-10-CM | POA: Diagnosis not present

## 2014-01-11 ENCOUNTER — Encounter: Payer: Self-pay | Admitting: Radiation Oncology

## 2014-01-11 ENCOUNTER — Ambulatory Visit
Admission: RE | Admit: 2014-01-11 | Discharge: 2014-01-11 | Disposition: A | Discharge status: Home / Self Care | Payer: Medicare Other | Source: Ambulatory Visit | Attending: Radiation Oncology | Admitting: Radiation Oncology

## 2014-01-11 VITALS — BP 120/73 | HR 62 | Temp 97.7°F | Resp 20 | Wt 208.7 lb

## 2014-01-11 DIAGNOSIS — C4442 Squamous cell carcinoma of skin of scalp and neck: Secondary | ICD-10-CM

## 2014-01-11 DIAGNOSIS — Z510 Encounter for antineoplastic radiation therapy: Secondary | ICD-10-CM | POA: Diagnosis not present

## 2014-01-11 NOTE — Progress Notes (Addendum)
Patient states his scalp "feels like a sunburn". He is applying Biafine twice daily but states he "sometimes forgets". Pt's scalp area hyperpigmented, some areas darker than others, no desquamation noted. Patient reports he is fatigued but continues to do the activities he enjoys. Wife had called triage service on 01/08/14 due to pt's scalp "being swollen where he is receiving radiation". Pt's scalp may be slightly swollen and advised pt and wofe that some fluid collection in the skin can be expected side effect of radiation.

## 2014-01-11 NOTE — Progress Notes (Signed)
Weekly Management Note:  Site: Right and central scalp Current Dose:  2250  cGy Projected Dose: 5000  cGy  Narrative: The patient is seen today for routine under treatment assessment. CBCT/MVCT images/port films were reviewed. The chart was reviewed.   Again, his wife did call the nurse on call to report "swelling" along his scalp. This has subsided. He uses Biafine cream twice a day.  Physical Examination:  Filed Vitals:   01/11/14 1051  BP: 120/73  Pulse: 62  Temp: 97.7 F (36.5 C)  Resp: 20  .  Weight: 208 lb 11.2 oz (94.666 kg). There is mild to moderate erythema of the skin along the right scalp with no areas of desquamation.  Impression: Tolerating radiation therapy well.  Plan: Continue radiation therapy as planned.

## 2014-01-12 ENCOUNTER — Ambulatory Visit
Admission: RE | Admit: 2014-01-12 | Discharge: 2014-01-12 | Disposition: A | Discharge status: Home / Self Care | Payer: Medicare Other | Source: Ambulatory Visit | Attending: Radiation Oncology | Admitting: Radiation Oncology

## 2014-01-12 DIAGNOSIS — Z510 Encounter for antineoplastic radiation therapy: Secondary | ICD-10-CM | POA: Diagnosis not present

## 2014-01-13 ENCOUNTER — Ambulatory Visit
Admission: RE | Admit: 2014-01-13 | Discharge: 2014-01-13 | Disposition: A | Discharge status: Home / Self Care | Payer: Medicare Other | Source: Ambulatory Visit | Attending: Radiation Oncology | Admitting: Radiation Oncology

## 2014-01-13 DIAGNOSIS — Z510 Encounter for antineoplastic radiation therapy: Secondary | ICD-10-CM | POA: Diagnosis not present

## 2014-01-14 ENCOUNTER — Ambulatory Visit
Admission: RE | Admit: 2014-01-14 | Discharge: 2014-01-14 | Disposition: A | Discharge status: Home / Self Care | Payer: Medicare Other | Source: Ambulatory Visit | Attending: Radiation Oncology | Admitting: Radiation Oncology

## 2014-01-14 DIAGNOSIS — Z510 Encounter for antineoplastic radiation therapy: Secondary | ICD-10-CM | POA: Diagnosis not present

## 2014-01-15 ENCOUNTER — Ambulatory Visit
Admission: RE | Admit: 2014-01-15 | Discharge: 2014-01-15 | Disposition: A | Discharge status: Home / Self Care | Payer: Medicare Other | Source: Ambulatory Visit | Attending: Radiation Oncology | Admitting: Radiation Oncology

## 2014-01-15 DIAGNOSIS — Z510 Encounter for antineoplastic radiation therapy: Secondary | ICD-10-CM | POA: Diagnosis not present

## 2014-01-18 ENCOUNTER — Ambulatory Visit
Admission: RE | Admit: 2014-01-18 | Discharge: 2014-01-18 | Disposition: A | Discharge status: Home / Self Care | Payer: Medicare Other | Source: Ambulatory Visit | Attending: Radiation Oncology | Admitting: Radiation Oncology

## 2014-01-18 VITALS — BP 126/71 | HR 63 | Temp 97.8°F | Resp 20 | Wt 210.0 lb

## 2014-01-18 DIAGNOSIS — C4442 Squamous cell carcinoma of skin of scalp and neck: Secondary | ICD-10-CM

## 2014-01-18 DIAGNOSIS — Z510 Encounter for antineoplastic radiation therapy: Secondary | ICD-10-CM | POA: Diagnosis not present

## 2014-01-18 MED ORDER — RADIAPLEXRX EX GEL
Freq: Once | CUTANEOUS | Status: AC
  Administered 2014-01-18: 11:00:00 via TOPICAL

## 2014-01-18 NOTE — Progress Notes (Signed)
Patient states his scalp "is stinging". He states it stings every time he applies Biafine , and he states "It is still burning six hours alter." Gave pt Radiaplex to apply instead of Biafine, will check with pt later this week to get update on his skin. Skin of scalp red, no drainage or desquamation noted. Pt is fatigued, denies loss of appetite.

## 2014-01-18 NOTE — Progress Notes (Signed)
Weekly Management Note:  Site: Scalp Current Dose:  3500  cGy Projected Dose: 5000  cGy  Narrative: The patient is seen today for routine under treatment assessment. CBCT/MVCT images/port films were reviewed. The chart was reviewed.   He states that Biafine cream "stings" his scalp 1 application.  Physical Examination:  Filed Vitals:   01/18/14 1049  BP: 126/71  Pulse: 63  Temp: 97.8 F (36.6 C)  Resp: 20  .  Weight: 210 lb (95.255 kg). There is moderate erythema the scalp along with keratotic radiation dermatitis. There is no skin ulceration.  Impression: Tolerating radiation therapy well. We will stop his Biafine cream and start Radioplex gel. If he still has stinging and burning, we will simply changed to hydrocortisone cream.  Plan: Continue radiation therapy as planned.

## 2014-01-19 ENCOUNTER — Ambulatory Visit
Admission: RE | Admit: 2014-01-19 | Discharge: 2014-01-19 | Disposition: A | Discharge status: Home / Self Care | Payer: Medicare Other | Source: Ambulatory Visit | Attending: Radiation Oncology | Admitting: Radiation Oncology

## 2014-01-19 DIAGNOSIS — Z510 Encounter for antineoplastic radiation therapy: Secondary | ICD-10-CM | POA: Diagnosis not present

## 2014-01-20 ENCOUNTER — Ambulatory Visit
Admission: RE | Admit: 2014-01-20 | Discharge: 2014-01-20 | Disposition: A | Discharge status: Home / Self Care | Payer: Medicare Other | Source: Ambulatory Visit | Attending: Radiation Oncology | Admitting: Radiation Oncology

## 2014-01-20 DIAGNOSIS — Z510 Encounter for antineoplastic radiation therapy: Secondary | ICD-10-CM | POA: Diagnosis not present

## 2014-01-21 ENCOUNTER — Ambulatory Visit
Admission: RE | Admit: 2014-01-21 | Discharge: 2014-01-21 | Disposition: A | Discharge status: Home / Self Care | Payer: Medicare Other | Source: Ambulatory Visit | Attending: Radiation Oncology | Admitting: Radiation Oncology

## 2014-01-21 DIAGNOSIS — C4442 Squamous cell carcinoma of skin of scalp and neck: Secondary | ICD-10-CM | POA: Diagnosis not present

## 2014-01-21 DIAGNOSIS — Z510 Encounter for antineoplastic radiation therapy: Secondary | ICD-10-CM | POA: Insufficient documentation

## 2014-01-21 NOTE — Progress Notes (Signed)
Spoke with pt re: skin irritation. He states his scalp "still stings, but the Radiaplex makes it stop and is very soothing". He will continue applying Radiaplex twice daily.

## 2014-01-22 ENCOUNTER — Ambulatory Visit
Admission: RE | Admit: 2014-01-22 | Discharge: 2014-01-22 | Disposition: A | Discharge status: Home / Self Care | Payer: Medicare Other | Source: Ambulatory Visit | Attending: Radiation Oncology | Admitting: Radiation Oncology

## 2014-01-22 DIAGNOSIS — Z510 Encounter for antineoplastic radiation therapy: Secondary | ICD-10-CM | POA: Diagnosis not present

## 2014-01-25 ENCOUNTER — Ambulatory Visit
Admission: RE | Admit: 2014-01-25 | Discharge: 2014-01-25 | Disposition: A | Discharge status: Home / Self Care | Payer: Medicare Other | Source: Ambulatory Visit | Attending: Radiation Oncology | Admitting: Radiation Oncology

## 2014-01-25 ENCOUNTER — Encounter: Payer: Self-pay | Admitting: Radiation Oncology

## 2014-01-25 VITALS — BP 133/72 | HR 65 | Temp 97.8°F | Resp 20 | Wt 212.1 lb

## 2014-01-25 DIAGNOSIS — Z510 Encounter for antineoplastic radiation therapy: Secondary | ICD-10-CM | POA: Diagnosis not present

## 2014-01-25 DIAGNOSIS — C4442 Squamous cell carcinoma of skin of scalp and neck: Secondary | ICD-10-CM

## 2014-01-25 NOTE — Progress Notes (Signed)
Weekly Management Note:  Site: Scalp Current Dose:  4750  cGy Projected Dose: 5  cGy  Narrative: The patient is seen today for routine under treatment assessment. CBCT/MVCT images/port films were reviewed. The chart was reviewed.   He still doing well today. He continues with his Radioplex gel.  Physical Examination:  Filed Vitals:   01/25/14 1110  BP: 133/72  Pulse: 65  Temp: 97.8 F (36.6 C)  Resp: 20  .  Weight: 212 lb 1.6 oz (96.208 kg). There is marked erythema of the scalp with areas of hyperkeratosis along with focal areas of moist desquamation and dry desquamation as expected.  Impression: Tolerating radiation therapy well.  Plan: Continue radiation therapy as planned. He'll finish his radiation therapy tomorrow and see me for a followup visit in one week for a skin check.

## 2014-01-25 NOTE — Progress Notes (Signed)
Patient denies pain but states he has occasional sharp pains in the center of his scalp. He is fatigued, applying Radiaplex to scalp for dry desquamation, hyperpigmentation. Advised he continue twice daily after he completes radiation tomorrow. Gave him FU card. Advised he ask Dr Valere Dross if he should FU earlier than 1 month.

## 2014-01-26 ENCOUNTER — Ambulatory Visit
Admission: RE | Admit: 2014-01-26 | Discharge: 2014-01-26 | Disposition: A | Discharge status: Home / Self Care | Payer: Medicare Other | Source: Ambulatory Visit | Attending: Radiation Oncology | Admitting: Radiation Oncology

## 2014-01-26 ENCOUNTER — Encounter: Payer: Self-pay | Admitting: Radiation Oncology

## 2014-01-26 DIAGNOSIS — Z510 Encounter for antineoplastic radiation therapy: Secondary | ICD-10-CM | POA: Diagnosis not present

## 2014-01-26 NOTE — Progress Notes (Signed)
[facility] End of Treatment Note  Name:Brandon Clements  Date: 01/26/2014 EXH:[id number] DOB:Jun 27, 1940   Status:outpatient    CC: SPARKS,JEFFREY D, MD  Dr. Griselda Miner  REFERRING PHYSICIAN: Dr. 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 tolerated treatment well with patchy keratosis and focal moist desquamation  skin by completion of therapy. He used Radioplex gel during his course of therapy.                         PLAN: Routine followup in one month. Patient instructed to call if questions or worsening complaints in interim.

## 2014-01-28 ENCOUNTER — Encounter: Payer: Self-pay | Admitting: *Deleted

## 2014-02-02 ENCOUNTER — Ambulatory Visit
Admission: RE | Admit: 2014-02-02 | Discharge: 2014-02-02 | Disposition: A | Discharge status: Home / Self Care | Payer: Medicare Other | Source: Ambulatory Visit | Attending: Radiation Oncology | Admitting: Radiation Oncology

## 2014-02-02 VITALS — BP 137/75 | HR 71 | Temp 98.2°F | Resp 20 | Wt 209.3 lb

## 2014-02-02 DIAGNOSIS — C4442 Squamous cell carcinoma of skin of scalp and neck: Secondary | ICD-10-CM | POA: Diagnosis not present

## 2014-02-02 DIAGNOSIS — L598 Other specified disorders of the skin and subcutaneous tissue related to radiation: Secondary | ICD-10-CM | POA: Diagnosis not present

## 2014-02-02 DIAGNOSIS — Z510 Encounter for antineoplastic radiation therapy: Secondary | ICD-10-CM | POA: Diagnosis present

## 2014-02-02 MED ORDER — SILVER SULFADIAZINE 1 % EX CREA
TOPICAL_CREAM | Freq: Two times a day (BID) | CUTANEOUS | Status: DC
  Administered 2014-02-02: 12:00:00 via TOPICAL

## 2014-02-02 NOTE — Addendum Note (Signed)
Encounter addended by: Arlyss Repress, RN on: 02/02/2014 11:54 AM<BR>     Documentation filed: Orders

## 2014-02-02 NOTE — Progress Notes (Signed)
Patient states he has some discomfort of scalp, but "not as much as he was having". He is applying Radiaplex twice daily to scalp, has moist areas of desquamation, hyperpigmentation. One oval shaped area approximately 2-3 cm across his scalp is more moist, red.  He states he has had a few episodes of sharp pains in center of scalp.

## 2014-02-02 NOTE — Progress Notes (Signed)
CC: Dr. Griselda Miner  Followup note: [name] visits today for a one-week followup visit after completing radiation therapy to his right scalp on October 6. He is without complaints today except for some drainage of the past few days.  Physical examination: Alert and oriented. Filed Vitals:   02/02/14 1123  BP: 137/75  Pulse: 71  Temp: 98.2 F (36.8 C)  Resp: 20   On inspection of the scalp there is patchy moist desquamation with crusting within his electron beam treatment field.. There is no obvious infection.  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 I'll start him on Silvadene cream twice a day and see him back for a followup visit in one week. I gave him instructions for cleaning his wound.

## 2014-02-02 NOTE — Addendum Note (Signed)
Encounter addended by: Arlyss Repress, RN on: 02/02/2014 11:57 AM<BR>     Documentation filed: Inpatient MAR

## 2014-02-09 ENCOUNTER — Ambulatory Visit
Admission: RE | Admit: 2014-02-09 | Discharge: 2014-02-09 | Disposition: A | Discharge status: Home / Self Care | Payer: Medicare Other | Source: Ambulatory Visit | Attending: Radiation Oncology | Admitting: Radiation Oncology

## 2014-02-09 ENCOUNTER — Encounter: Payer: Self-pay | Admitting: Radiation Oncology

## 2014-02-09 VITALS — BP 139/71 | HR 64 | Temp 98.3°F | Resp 20 | Wt 209.0 lb

## 2014-02-09 DIAGNOSIS — C4442 Squamous cell carcinoma of skin of scalp and neck: Secondary | ICD-10-CM

## 2014-02-09 NOTE — Progress Notes (Signed)
Patient states he was applying antibiotic ointment to scalp but that "it took the skin off". He stopped several days ago. He has crusting on his scalp and areas of dark skin. He states it "itches at times", but he denies pain, fatigue.

## 2014-02-09 NOTE — Progress Notes (Signed)
CC: Dr. Griselda Miner  Followup note: Brandon Clements returns today approximately 2 weeks following completion of postoperative radiation therapy to his scalp in the management of his squamous cell carcinoma with perineural invasion. As expected, he developed a moist desquamation which we treated with Silvadene cream. He discontinued use a few days ago because of drainage along his forehead. His wife felt that he was using too much Silvadene.  Physical examination: There is central scalp crusting within his radiation therapy field measuring over an area 4 x 4.5 cm. Along the periphery is dry desquamation.  Impression: Satisfactory progress. I told Brandon Clements that I will like for him to continue with Silvadene cream for at least the next one to 2 weeks. He tells me he will see Dr. Harriett Sine of [facility] next week.  Plan: Follow visit with me for a skin check in 2 weeks.

## 2014-02-23 ENCOUNTER — Ambulatory Visit
Admission: RE | Admit: 2014-02-23 | Discharge: 2014-02-23 | Disposition: A | Discharge status: Home / Self Care | Payer: Medicare Other | Source: Ambulatory Visit | Attending: Radiation Oncology | Admitting: Radiation Oncology

## 2014-02-23 ENCOUNTER — Ambulatory Visit: Payer: Self-pay | Admitting: Radiation Oncology

## 2014-02-23 ENCOUNTER — Encounter: Payer: Self-pay | Admitting: Radiation Oncology

## 2014-02-23 VITALS — BP 121/70 | HR 72 | Temp 98.3°F | Resp 20 | Wt 208.0 lb

## 2014-02-23 DIAGNOSIS — C4442 Squamous cell carcinoma of skin of scalp and neck: Secondary | ICD-10-CM

## 2014-02-23 NOTE — Progress Notes (Signed)
Patient denies pain, fatigue. He states he has been applying antibiotic ointment twice daily to his scalp area. Scalp has small area of scab, no bright redness noted. Patient states "tt itches every now and then".

## 2014-02-23 NOTE — Progress Notes (Addendum)
Follow-up note:  Brandon Clements returns today for a skin check. He is now approximately 1 month following completion of postoperative electron beam radiation therapy in the management of his squamous cell carcinoma with perineural invasion. He has been using Silvadene twice a day. He saw Dr. Elvera Lennox of [facility] last week.  Physical examination: Alert and oriented. Filed Vitals:   02/23/14 1538  BP: 121/70  Pulse: 72  Temp: 98.3 F (36.8 C)  Resp: 20   On inspection of the scalp there were 2 areas of residual hyperkeratosis, both superficial one measuring 3.5 x 2 cm along the right frontal region and the other measuring 2.5 x 2 cm along the left frontal region. There is no evidence for recurrent disease.  Impression: Satisfactory progress.  Plan: One more follow-up visit in approximately 2 months to confirm complete reepithelialization.

## 2014-05-04 ENCOUNTER — Ambulatory Visit
Admission: RE | Admit: 2014-05-04 | Discharge: 2014-05-04 | Disposition: A | Discharge status: Home / Self Care | Payer: BLUE CROSS/BLUE SHIELD | Source: Ambulatory Visit | Attending: Radiation Oncology | Admitting: Radiation Oncology

## 2014-05-04 ENCOUNTER — Encounter: Payer: Self-pay | Admitting: Radiation Oncology

## 2014-05-04 VITALS — BP 117/58 | HR 62 | Temp 98.2°F | Ht 69.0 in | Wt 212.1 lb

## 2014-05-04 DIAGNOSIS — C4442 Squamous cell carcinoma of skin of scalp and neck: Secondary | ICD-10-CM

## 2014-05-04 NOTE — Progress Notes (Signed)
Brandon Clements reports that he does not have any pain today. Note a scab in the posterior scalp region.  Note Hyperkeratosis left scalp.

## 2014-05-04 NOTE — Progress Notes (Signed)
CC: Dr. Harriett Sine,  Dr. Griselda Miner  Follow-up note:  Brandon Clements returns today approximately 3 months following completion of postoperative radiation therapy to the right parietal scalp in the management of his moderately differentiated squamous cell carcinoma of the skin.  When I saw him for his one-month follow-up in early November there were 2 small areas of residual hyperkeratosis, one along the right frontal region and the other along the left frontal region.  He has a new area of hyperkeratosis which he believes started within a month following completion of radiation therapy.  He is scheduled see Dr. Elvera Lennox for a follow-up visit in 2 weeks.  Physical examination: Alert and oriented. Filed Vitals:   05/04/14 1002  BP: 117/58  Pulse: 62  Temp: 98.2 F (36.8 C)   The previously noted areas of residual hyperkeratosis along the frontal scalp have resolved.  There is a new raised area of hyperkeratosis along the left scalp which I believed to be just outside or at the border of his previous radiation therapy field based on the presence of residual actinic lesions.  This measures 2 x 1.5 cm.  When I saw him 2 months ago I did not appreciate this area or make noted.  There is a 0.5 cm area of hyperkeratosis along the mid posterior scalp just outside his radiation therapy field.  There is no palpable facial or periauricular adenopathy.  Impression: New keratotic lesion just at the border or just lateral to his previous electron beam field along the left scalp.  I made photographs of his radiation therapy field for Dr. Elvera Lennox who will see him in 2 weeks.  With the rapid growth I wonder if this could be a keratoacanthoma versus a new squamous cell carcinoma.  Regardless, my preference would be for surgical excision since  irradiation of this lesion would result in overlap with his previous radiation therapy field and should be avoided if possible.  Plan: Follow-up with Dr. Elvera Lennox in  2 weeks.  A photographic copy of his radiation therapy field was given to the patient for Dr. Elvera Lennox to review.

## 2014-05-10 ENCOUNTER — Other Ambulatory Visit: Payer: Self-pay | Admitting: Dermatology

## 2014-06-30 ENCOUNTER — Encounter: Payer: Self-pay | Admitting: Internal Medicine

## 2014-08-13 ENCOUNTER — Encounter: Payer: Self-pay | Admitting: Internal Medicine

## 2014-10-13 ENCOUNTER — Encounter: Payer: Self-pay | Admitting: Internal Medicine

## 2014-10-14 ENCOUNTER — Encounter: Payer: BLUE CROSS/BLUE SHIELD | Admitting: Internal Medicine

## 2014-10-15 ENCOUNTER — Encounter: Payer: Self-pay | Admitting: Internal Medicine

## 2014-10-15 ENCOUNTER — Ambulatory Visit (INDEPENDENT_AMBULATORY_CARE_PROVIDER_SITE_OTHER): Payer: Medicare Other | Admitting: Internal Medicine

## 2014-10-15 VITALS — BP 114/62 | HR 72 | Ht 69.0 in | Wt 207.1 lb

## 2014-10-15 DIAGNOSIS — Z86010 Personal history of colonic polyps: Secondary | ICD-10-CM

## 2014-10-15 DIAGNOSIS — K219 Gastro-esophageal reflux disease without esophagitis: Secondary | ICD-10-CM | POA: Diagnosis not present

## 2014-10-15 DIAGNOSIS — K2270 Barrett's esophagus without dysplasia: Secondary | ICD-10-CM

## 2014-10-15 DIAGNOSIS — Z8589 Personal history of malignant neoplasm of other organs and systems: Secondary | ICD-10-CM | POA: Diagnosis not present

## 2014-10-15 MED ORDER — NA SULFATE-K SULFATE-MG SULF 17.5-3.13-1.6 GM/177ML PO SOLN: 1.0000 | Freq: Once | ORAL | Status: DC

## 2014-10-15 NOTE — Progress Notes (Signed)
HISTORY OF PRESENT ILLNESS:  Brandon Clements is a 74 y.o. male with multiple medical problems as listed below. He has been followed in this office for GERD complicated by peptic stricture and long segment Barrett's esophagus. Also history of adenomatous colon polyps. He was last evaluated in the office October 2013 regarding surveillance. However, shortly after being diagnosed with head and neck cancer and undergoing radical surgery. We decided to reevaluate him at this time for consideration of surveillance colonoscopy and upper endoscopy. He is currently taking omeprazole for GERD. Good control of symptoms. No recurrent dysphagia. His weight has been stable. Cancer felt to be under control. No lower GI complaints. He is accompanied today by his wife.  REVIEW OF SYSTEMS:  All non-GI ROS negative except for arthritis  Past Medical History  Diagnosis Date  . CAD (coronary artery disease) 2005    angioplasty with 3 stents  . Barrett's esophagus   . GERD (gastroesophageal reflux disease)   . 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 Arthritis   . Hyperlipidemia   . Skin cancer 11/2013    right frontal scalp, squamous cell carcinoma, history basal cell ca  . Hypertension   . Myocardial infarction 2005    "mild"  . Hx of radiation therapy 2011    left cheek, [facility]  . Hx of radiation therapy 12/30/13-01/26/14    scalp 5000 cGy in 20 sessions  . Hiatal hernia   . Colon polyp     adenomatous  . Status post dilation of esophageal narrowing   . Kidney stones     Past Surgical History  Procedure Laterality Date  . Radical neck dissection  July 2012    left neck, jaw, and ear  . Neck dissection  Feb 2012    right  . Angioplasty  2005    3 stents  . Colonoscopy  2010, 2006    hx adenomatous polyps 2006/ Dr. Henrene Pastor  . Cancer sugery      Skin cancer  . External ear surgery Left     left ear removal  . Cardiac stent       x3    Social History RAHMEL NEDVED  reports that he quit smoking about 49 years ago. His smokeless tobacco use includes Chew. He reports that he drinks about 8.4 oz of alcohol per week. He reports that he does not use illicit drugs.  family history includes Alzheimer's disease in his mother; Bladder Cancer in his father; Skin cancer in his father. There is no history of Colon cancer, Stomach cancer, Colon polyps, Esophageal cancer, Kidney disease, Gallbladder disease, or Diabetes.  No Known Allergies     PHYSICAL EXAMINATION: Vital signs: BP 114/62 mmHg  Pulse 72  Ht 5\' 9"  (1.753 m)  Wt 207 lb 2 oz (93.951 kg)  BMI 30.57 kg/m2  Constitutional: generally well-appearing, no acute distress Psychiatric: alert and oriented x3, cooperative Eyes: extraocular movements intact, anicteric, conjunctiva pink Head and neck: Deformity of the left face and neck with mandibular skin graft Cardiovascular: heart regular rate and rhythm, no murmur Lungs: clear to auscultation bilaterally Abdomen: soft, nontender, nondistended, no obvious ascites, no peritoneal signs, normal bowel sounds, no organomegaly Rectal: For until colonoscopy Extremities: no lower extremity edema bilaterally Skin: no lesions on visible extremities Neuro: No focal deficits.   ASSESSMENT:  #1. GERD, complicated by peptic stricture and  Barrett's esophagus. Currently asymptomatic on PPI. Last upper endoscopy in August 2010 with nondysplastic Barrett's. Due for surveillance #2. History of adenomatous colon polyps. Last colonoscopy August 2010. 1 small tubular adenoma. #3. Multiple medical problems. Stable   PLAN:  #1. Colonoscopy and upper endoscopy with surveillance biopsies. The patient is high-risk given his comorbidities and had neck surgery. We will perform the procedures at the hospital with MAC/propofol.The nature of the procedure, as well as the risks, benefits, and alternatives were carefully and thoroughly  reviewed with the patient. Ample time for discussion and questions allowed. The patient understood, was satisfied, and agreed to proceed. This is tentatively set up for August 16. #2. Continue omeprazole

## 2014-10-15 NOTE — Patient Instructions (Signed)
You have been scheduled for an endoscopy and colonoscopy at [facility]. Please follow the written instructions given to you at your visit today. Please pick up your prep supplies at the pharmacy within the next 1-3 days. If you use inhalers (even only as needed), please bring them with you on the day of your procedure.

## 2014-12-06 ENCOUNTER — Encounter (HOSPITAL_COMMUNITY): Payer: Self-pay | Admitting: *Deleted

## 2014-12-07 ENCOUNTER — Ambulatory Visit (HOSPITAL_COMMUNITY): Payer: Medicare Other | Admitting: Certified Registered Nurse Anesthetist

## 2014-12-07 ENCOUNTER — Ambulatory Visit (HOSPITAL_COMMUNITY)
Admission: RE | Admit: 2014-12-07 | Discharge: 2014-12-07 | Disposition: A | Discharge status: Home / Self Care | Payer: Medicare Other | Source: Ambulatory Visit | Attending: Internal Medicine | Admitting: Internal Medicine

## 2014-12-07 ENCOUNTER — Encounter (HOSPITAL_COMMUNITY)
Admission: RE | Disposition: A | Discharge status: Home / Self Care | Payer: Self-pay | Source: Ambulatory Visit | Attending: Internal Medicine

## 2014-12-07 ENCOUNTER — Encounter (HOSPITAL_COMMUNITY): Payer: Self-pay | Admitting: Certified Registered Nurse Anesthetist

## 2014-12-07 DIAGNOSIS — Z1211 Encounter for screening for malignant neoplasm of colon: Secondary | ICD-10-CM | POA: Insufficient documentation

## 2014-12-07 DIAGNOSIS — Z85828 Personal history of other malignant neoplasm of skin: Secondary | ICD-10-CM | POA: Diagnosis not present

## 2014-12-07 DIAGNOSIS — K648 Other hemorrhoids: Secondary | ICD-10-CM | POA: Diagnosis not present

## 2014-12-07 DIAGNOSIS — I10 Essential (primary) hypertension: Secondary | ICD-10-CM | POA: Diagnosis not present

## 2014-12-07 DIAGNOSIS — Z923 Personal history of irradiation: Secondary | ICD-10-CM | POA: Diagnosis not present

## 2014-12-07 DIAGNOSIS — Z87891 Personal history of nicotine dependence: Secondary | ICD-10-CM | POA: Insufficient documentation

## 2014-12-07 DIAGNOSIS — K5730 Diverticulosis of large intestine without perforation or abscess without bleeding: Secondary | ICD-10-CM | POA: Insufficient documentation

## 2014-12-07 DIAGNOSIS — K317 Polyp of stomach and duodenum: Secondary | ICD-10-CM | POA: Insufficient documentation

## 2014-12-07 DIAGNOSIS — I252 Old myocardial infarction: Secondary | ICD-10-CM | POA: Diagnosis not present

## 2014-12-07 DIAGNOSIS — Z87442 Personal history of urinary calculi: Secondary | ICD-10-CM | POA: Diagnosis not present

## 2014-12-07 DIAGNOSIS — M19011 Primary osteoarthritis, right shoulder: Secondary | ICD-10-CM | POA: Insufficient documentation

## 2014-12-07 DIAGNOSIS — M19012 Primary osteoarthritis, left shoulder: Secondary | ICD-10-CM | POA: Insufficient documentation

## 2014-12-07 DIAGNOSIS — I2510 Atherosclerotic heart disease of native coronary artery without angina pectoris: Secondary | ICD-10-CM | POA: Diagnosis not present

## 2014-12-07 DIAGNOSIS — K449 Diaphragmatic hernia without obstruction or gangrene: Secondary | ICD-10-CM | POA: Insufficient documentation

## 2014-12-07 DIAGNOSIS — K2270 Barrett's esophagus without dysplasia: Secondary | ICD-10-CM | POA: Diagnosis not present

## 2014-12-07 DIAGNOSIS — Z7982 Long term (current) use of aspirin: Secondary | ICD-10-CM | POA: Diagnosis not present

## 2014-12-07 DIAGNOSIS — D122 Benign neoplasm of ascending colon: Secondary | ICD-10-CM | POA: Insufficient documentation

## 2014-12-07 DIAGNOSIS — E785 Hyperlipidemia, unspecified: Secondary | ICD-10-CM | POA: Insufficient documentation

## 2014-12-07 DIAGNOSIS — Z860100 Personal history of colon polyps, unspecified: Secondary | ICD-10-CM | POA: Insufficient documentation

## 2014-12-07 DIAGNOSIS — D123 Benign neoplasm of transverse colon: Secondary | ICD-10-CM | POA: Insufficient documentation

## 2014-12-07 DIAGNOSIS — K219 Gastro-esophageal reflux disease without esophagitis: Secondary | ICD-10-CM | POA: Diagnosis not present

## 2014-12-07 HISTORY — PX: ESOPHAGOGASTRODUODENOSCOPY: SHX5428

## 2014-12-07 HISTORY — PX: COLONOSCOPY: SHX5424

## 2014-12-07 SURGERY — EGD (ESOPHAGOGASTRODUODENOSCOPY)
Anesthesia: Monitor Anesthesia Care

## 2014-12-07 MED ORDER — SODIUM CHLORIDE 0.9 % IV SOLN: INTRAVENOUS | Status: DC

## 2014-12-07 MED ORDER — BUTAMBEN-TETRACAINE-BENZOCAINE 2-2-14 % EX AERO
INHALATION_SPRAY | CUTANEOUS | Status: DC | PRN
  Administered 2014-12-07: 2 via TOPICAL

## 2014-12-07 MED ORDER — ONDANSETRON HCL 4 MG/2ML IJ SOLN
INTRAMUSCULAR | Status: DC | PRN
  Administered 2014-12-07: 4 mg via INTRAVENOUS

## 2014-12-07 MED ORDER — LACTATED RINGERS IV SOLN
INTRAVENOUS | Status: DC
  Administered 2014-12-07: 1000 mL via INTRAVENOUS

## 2014-12-07 MED ORDER — PROPOFOL 10 MG/ML IV BOLUS
INTRAVENOUS | Status: AC
  Filled 2014-12-07: qty 20

## 2014-12-07 MED ORDER — LIDOCAINE HCL (CARDIAC) 20 MG/ML IV SOLN
INTRAVENOUS | Status: AC
  Filled 2014-12-07: qty 5

## 2014-12-07 MED ORDER — PROPOFOL INFUSION 10 MG/ML OPTIME
INTRAVENOUS | Status: DC | PRN
  Administered 2014-12-07: 300 ug/kg/min via INTRAVENOUS

## 2014-12-07 MED ORDER — LIDOCAINE HCL (CARDIAC) 20 MG/ML IV SOLN
INTRAVENOUS | Status: DC | PRN
  Administered 2014-12-07: 100 mg via INTRAVENOUS

## 2014-12-07 MED ORDER — ONDANSETRON HCL 4 MG/2ML IJ SOLN
INTRAMUSCULAR | Status: AC
  Filled 2014-12-07: qty 2

## 2014-12-07 NOTE — Op Note (Signed)
[facility] [place] [place], [address]   COLONOSCOPY PROCEDURE REPORT  PATIENT: Brandon Clements, Brandon Clements  MR#: [id number] BIRTHDATE: [birth year] , 74  yrs. old GENDER: male ENDOSCOPIST: Eustace Quail, MD REFERRED SW:[id number] Program Recall PROCEDURE DATE:  12/07/2014 PROCEDURE:   Colonoscopy, surveillance and Colonoscopy with snare polypectomy x 3 First Screening Colonoscopy - Avg.  risk and is 50 yrs.  old or older - No.  Prior Negative Screening - Now for repeat screening. N/A  History of Adenoma - Now for follow-up colonoscopy & has been > or = to 3 yrs.  Yes hx of adenoma.  Has been 3 or more years since last colonoscopy.  Polyps removed today? Yes ASA CLASS:   Class III INDICATIONS:Surveillance due to prior colonic neoplasia and PH Colon Adenoma.   . Last examination August 2010 with small tubular adenoma MEDICATIONS: Monitored anesthesia care and Per Anesthesia  DESCRIPTION OF PROCEDURE:   After the risks benefits and alternatives of the procedure were thoroughly explained, informed consent was obtained.  The digital rectal exam revealed no abnormalities of the rectum.   The Pentax Ped Colon [id number] endoscope was introduced through the anus and advanced to the cecum, which was identified by both the appendix and ileocecal valve. No adverse events experienced.   The quality of the prep was good.  (MoviPrep was used)  The instrument was then slowly withdrawn as the colon was fully examined. Estimated blood loss is zero unless otherwise noted in this procedure report.  COLON FINDINGS: Three polyps ranging between 3-24mm in size were found in the transverse colon and ascending colon.  A polypectomy was performed with a cold snare.  The resection was complete, the polyp tissue was completely retrieved and sent to histology. There was mild diverticulosis noted in the left colon.   The examination was otherwise normal.  Retroflexed views revealed internal  hemorrhoids. The time to cecum = 2.5 Withdrawal time = 14.7   The scope was withdrawn and the procedure completed. COMPLICATIONS: There were no immediate complications.  ENDOSCOPIC IMPRESSION: 1.   Three polyps were found in the transverse colon and ascending colon; polypectomy was performed with a cold snare 2.   Mild diverticulosis was noted in the left colon 3.   The examination was otherwise normal  RECOMMENDATIONS: 1.  Follow up colonoscopy in 5 years, to be considered based on overall medical health 2.  Upper endoscopy today (please see report)  eSigned:  Eustace Quail, MD 12/07/2014 9:36 AM   cc: The Patient and Fulton Reek, MD

## 2014-12-07 NOTE — Op Note (Signed)
[facility] [place], [address]   ENDOSCOPY PROCEDURE REPORT  PATIENT: Lemont, Sitzmann  MR#: [id number] BIRTHDATE: October 11, 1940 , 74  yrs. old GENDER: male ENDOSCOPIST: Eustace Quail, MD REFERRED BY:  Surveillance Program Recall PROCEDURE DATE:  12/07/2014 PROCEDURE:  EGD w/ biopsies ASA CLASS:     Class III INDICATIONS:  history of Barrett's esophagus. MEDICATIONS: Monitored anesthesia care and Per Anesthesia TOPICAL ANESTHETIC: none  DESCRIPTION OF PROCEDURE: After the risks benefits and alternatives of the procedure were thoroughly explained, informed consent was obtained.  The Pentax Gastroscope [id number] endoscope was introduced through the mouth and advanced to the second portion of the duodenum , Without limitations.  The instrument was slowly withdrawn as the mucosa was fully examined.  Esophagus revealed Barrett's type mucosa involving the distal 10 cm of the esophagus (C10,M10) without obvious inflammation or nodularity.  Multiple biopsies taken.  Stomach revealed a sliding hiatal hernia and benign fundic gland type polyps.  The duodenum was normal.  Retroflexed views revealed a hiatal hernia.     The scope was then withdrawn from the patient and the procedure completed.  COMPLICATIONS: There were no immediate complications.  ENDOSCOPIC IMPRESSION: 1. GERD 2. Barrett's esophagus status post surveillance biopsies  RECOMMENDATIONS: 1.  Continue PPI (Prilosec) daily 2.  REPEAT SURVEILLANCE EGD IN 5 YEARS , if no dysplasia on biopsies and medically fit  REPEAT EXAM:  eSigned:  Eustace Quail, MD 12/07/2014 9:42 AM    CC:The Patient and Fulton Reek, MD

## 2014-12-07 NOTE — Anesthesia Preprocedure Evaluation (Signed)
Anesthesia Evaluation  Patient identified by MRN, date of birth, ID band Patient awake    Reviewed: Allergy & Precautions, NPO status , Patient's Chart, lab work & pertinent test results  Airway Mallampati: III  TM Distance: >3 FB Neck ROM: Full    Dental no notable dental hx.    Pulmonary neg pulmonary ROS, former smoker,  breath sounds clear to auscultation  Pulmonary exam normal       Cardiovascular hypertension, Pt. on medications + CAD, + Past MI and + Cardiac Stents Normal cardiovascular examRhythm:Regular Rate:Normal     Neuro/Psych negative neurological ROS  negative psych ROS   GI/Hepatic Neg liver ROS, hiatal hernia, GERD-  ,  Endo/Other  negative endocrine ROS  Renal/GU negative Renal ROS  negative genitourinary   Musculoskeletal negative musculoskeletal ROS (+)   Abdominal   Peds negative pediatric ROS (+)  Hematology negative hematology ROS (+)   Anesthesia Other Findings S/p radiation to face and radical neck.  Reproductive/Obstetrics negative OB ROS                             Anesthesia Physical Anesthesia Plan  ASA: III  Anesthesia Plan: MAC   Post-op Pain Management:    Induction: Intravenous  Airway Management Planned: Simple Face Mask  Additional Equipment:   Intra-op Plan:   Post-operative Plan: Extubation in OR  Informed Consent: I have reviewed the patients History and Physical, chart, labs and discussed the procedure including the risks, benefits and alternatives for the proposed anesthesia with the patient or authorized representative who has indicated his/her understanding and acceptance.   Dental advisory given  Plan Discussed with: CRNA  Anesthesia Plan Comments:         Anesthesia Quick Evaluation

## 2014-12-07 NOTE — Transfer of Care (Signed)
Immediate Anesthesia Transfer of Care Note  Patient: Brandon Clements  Procedure(s) Performed: Procedure(s): ESOPHAGOGASTRODUODENOSCOPY (EGD) (N/A) COLONOSCOPY (N/A)  Patient Location: PACU  Anesthesia Type:MAC  Level of Consciousness:  sedated, patient cooperative and responds to stimulation  Airway & Oxygen Therapy:Patient Spontanous Breathing and Patient connected to face mask oxgen  Post-op Assessment:  Report given to PACU RN and Post -op Vital signs reviewed and stable  Post vital signs:  Reviewed and stable  Last Vitals:  Filed Vitals:   12/07/14 0823  BP: 134/74  Pulse: 98  Temp: 36.5 C  Resp: 16    Complications: No apparent anesthesia complications

## 2014-12-07 NOTE — Anesthesia Postprocedure Evaluation (Signed)
  Anesthesia Post-op Note  Patient: Brandon Clements  Procedure(s) Performed: Procedure(s) (LRB): ESOPHAGOGASTRODUODENOSCOPY (EGD) (N/A) COLONOSCOPY (N/A)  Patient Location: PACU  Anesthesia Type: MAC  Level of Consciousness: awake and alert   Airway and Oxygen Therapy: Patient Spontanous Breathing  Post-op Pain: mild  Post-op Assessment: Post-op Vital signs reviewed, Patient's Cardiovascular Status Stable, Respiratory Function Stable, Patent Airway and No signs of Nausea or vomiting  Last Vitals:  Filed Vitals:   12/07/14 0950  BP: 132/66  Pulse: 54  Temp:   Resp: 13    Post-op Vital Signs: stable   Complications: No apparent anesthesia complications

## 2014-12-07 NOTE — H&P (Signed)
HISTORY OF PRESENT ILLNESS:  Brandon Clements is a 74 y.o. male was evaluated in the office 10/15/2014 regarding surveillance colonoscopy for history of tubular adenomas and surveillance upper endoscopy for history of nondysplastic Barrett's esophagus and GERD. He had no GI complaints that time. No dysphagia. He continues on omeprazole. He has prepared for the examinations without difficulty. No interval problems or issues. He is accompanied this morning by his wife  REVIEW OF SYSTEMS:  All non-GI ROS noncontributory  Past Medical History  Diagnosis Date  . CAD (coronary artery disease) 2005    angioplasty with 3 stents  . Barrett's esophagus   . GERD (gastroesophageal reflux disease)   . Hyperlipidemia   . Hypertension   . Myocardial infarction 2005    "mild"  . Hx of radiation therapy 2011    left cheek, [facility]  . Hx of radiation therapy 12/30/13-01/26/14    scalp 5000 cGy in 20 sessions  . Colon polyp     adenomatous  . Status post dilation of esophageal narrowing   . Kidney stones   . Hiatal hernia   . 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 Skin cancer 11/2013    right frontal scalp, squamous cell carcinoma, history basal cell ca  . Arthritis     Right shoulder mostly and left shoulder    Past Surgical History  Procedure Laterality Date  . Radical neck dissection  July 2012    left neck, jaw, and ear  . Neck dissection  Feb 2012    right  . Angioplasty  2005    3 stents  . Colonoscopy  2010, 2006    hx adenomatous polyps 2006/ Dr. Henrene Pastor  . Cancer sugery      Skin cancer  . External ear surgery Left     left ear removal  . Cardiac stent      x3    Social History KENDEL BESSEY  reports that he quit smoking about 49 years ago. His smokeless tobacco use includes Chew. He reports that he drinks about 8.4 oz of alcohol per week. He reports that he does not use illicit drugs.  family  history includes Alzheimer's disease in his mother; Bladder Cancer in his father; Skin cancer in his father. There is no history of Colon cancer, Stomach cancer, Colon polyps, Esophageal cancer, Kidney disease, Gallbladder disease, or Diabetes.  No Known Allergies     PHYSICAL EXAMINATION: Vital signs: BP 134/74 mmHg  Pulse 98  Temp(Src) 97.7 F (36.5 C) (Oral)  Resp 16  Ht 5\' 9"  (1.753 m)  Wt 207 lb (93.895 kg)  BMI 30.55 kg/m2  SpO2 98% General: Well-developed, well-nourished, no acute distress HEENT: Her head and neck surgery with anatomic distortion skin flap on left. Sclerae are anicteric, conjunctiva pink. Oral mucosa intact Lungs: Clear Heart: Regular Abdomen: soft, nontender, nondistended, no obvious ascites, no peritoneal signs, normal bowel sounds. No organomegaly. Extremities: No edema Psychiatric: alert and oriented x3. Cooperative     ASSESSMENT:  #1. History of tubular adenomas of the colon. Last examination August 2010. Due for surveillance #2. GERD complicated by nondysplastic Barrett's esophagus. Last endoscopy with biopsies August 2010. Due for surveillance #3. Multiple medical problems. Stable  PLAN:  #1. Colonoscopy and upper endoscopy biopsies.The nature of the procedure, as well as the risks, benefits, and alternatives were carefully and thoroughly reviewed with  the patient. Ample time for discussion and questions allowed. The patient understood, was satisfied, and agreed to proceed.

## 2014-12-08 ENCOUNTER — Encounter (HOSPITAL_COMMUNITY): Payer: Self-pay | Admitting: Internal Medicine

## 2014-12-08 ENCOUNTER — Encounter: Payer: Self-pay | Admitting: Internal Medicine

## 2014-12-14 ENCOUNTER — Encounter (HOSPITAL_COMMUNITY): Payer: Self-pay | Admitting: Internal Medicine

## 2015-05-31 DIAGNOSIS — R739 Hyperglycemia, unspecified: Secondary | ICD-10-CM | POA: Diagnosis not present

## 2015-06-03 DIAGNOSIS — C410 Malignant neoplasm of bones of skull and face: Secondary | ICD-10-CM | POA: Diagnosis not present

## 2015-06-07 DIAGNOSIS — L821 Other seborrheic keratosis: Secondary | ICD-10-CM | POA: Diagnosis not present

## 2015-06-07 DIAGNOSIS — L570 Actinic keratosis: Secondary | ICD-10-CM | POA: Diagnosis not present

## 2015-06-07 DIAGNOSIS — L309 Dermatitis, unspecified: Secondary | ICD-10-CM | POA: Diagnosis not present

## 2015-06-07 DIAGNOSIS — Z85828 Personal history of other malignant neoplasm of skin: Secondary | ICD-10-CM | POA: Diagnosis not present

## 2015-06-07 DIAGNOSIS — L918 Other hypertrophic disorders of the skin: Secondary | ICD-10-CM | POA: Diagnosis not present

## 2015-06-07 DIAGNOSIS — L814 Other melanin hyperpigmentation: Secondary | ICD-10-CM | POA: Diagnosis not present

## 2015-06-07 DIAGNOSIS — D1801 Hemangioma of skin and subcutaneous tissue: Secondary | ICD-10-CM | POA: Diagnosis not present

## 2015-08-26 DIAGNOSIS — I10 Essential (primary) hypertension: Secondary | ICD-10-CM | POA: Diagnosis not present

## 2015-08-26 DIAGNOSIS — E7800 Pure hypercholesterolemia, unspecified: Secondary | ICD-10-CM | POA: Diagnosis not present

## 2015-08-26 DIAGNOSIS — K219 Gastro-esophageal reflux disease without esophagitis: Secondary | ICD-10-CM | POA: Diagnosis not present

## 2015-08-26 DIAGNOSIS — C439 Malignant melanoma of skin, unspecified: Secondary | ICD-10-CM | POA: Diagnosis not present

## 2015-08-26 DIAGNOSIS — Z79899 Other long term (current) drug therapy: Secondary | ICD-10-CM | POA: Diagnosis not present

## 2015-08-26 DIAGNOSIS — Z0000 Encounter for general adult medical examination without abnormal findings: Secondary | ICD-10-CM | POA: Diagnosis not present

## 2015-08-26 DIAGNOSIS — E785 Hyperlipidemia, unspecified: Secondary | ICD-10-CM | POA: Diagnosis not present

## 2015-08-26 DIAGNOSIS — R739 Hyperglycemia, unspecified: Secondary | ICD-10-CM | POA: Diagnosis not present

## 2015-10-27 DIAGNOSIS — D224 Melanocytic nevi of scalp and neck: Secondary | ICD-10-CM | POA: Diagnosis not present

## 2015-10-27 DIAGNOSIS — L814 Other melanin hyperpigmentation: Secondary | ICD-10-CM | POA: Diagnosis not present

## 2015-10-27 DIAGNOSIS — D1801 Hemangioma of skin and subcutaneous tissue: Secondary | ICD-10-CM | POA: Diagnosis not present

## 2015-10-27 DIAGNOSIS — D485 Neoplasm of uncertain behavior of skin: Secondary | ICD-10-CM | POA: Diagnosis not present

## 2015-10-27 DIAGNOSIS — Z85828 Personal history of other malignant neoplasm of skin: Secondary | ICD-10-CM | POA: Diagnosis not present

## 2015-10-27 DIAGNOSIS — L821 Other seborrheic keratosis: Secondary | ICD-10-CM | POA: Diagnosis not present

## 2015-10-27 DIAGNOSIS — L989 Disorder of the skin and subcutaneous tissue, unspecified: Secondary | ICD-10-CM | POA: Diagnosis not present

## 2015-10-27 DIAGNOSIS — L570 Actinic keratosis: Secondary | ICD-10-CM | POA: Diagnosis not present

## 2015-10-31 DIAGNOSIS — E7800 Pure hypercholesterolemia, unspecified: Secondary | ICD-10-CM | POA: Diagnosis not present

## 2015-10-31 DIAGNOSIS — I2510 Atherosclerotic heart disease of native coronary artery without angina pectoris: Secondary | ICD-10-CM | POA: Diagnosis not present

## 2015-10-31 DIAGNOSIS — I10 Essential (primary) hypertension: Secondary | ICD-10-CM | POA: Diagnosis not present

## 2015-11-08 DIAGNOSIS — Z85828 Personal history of other malignant neoplasm of skin: Secondary | ICD-10-CM | POA: Diagnosis not present

## 2015-11-08 DIAGNOSIS — L570 Actinic keratosis: Secondary | ICD-10-CM | POA: Diagnosis not present

## 2015-11-08 DIAGNOSIS — L988 Other specified disorders of the skin and subcutaneous tissue: Secondary | ICD-10-CM | POA: Diagnosis not present

## 2015-11-08 DIAGNOSIS — D485 Neoplasm of uncertain behavior of skin: Secondary | ICD-10-CM | POA: Diagnosis not present

## 2015-12-02 DIAGNOSIS — Z85830 Personal history of malignant neoplasm of bone: Secondary | ICD-10-CM | POA: Diagnosis not present

## 2015-12-02 DIAGNOSIS — C410 Malignant neoplasm of bones of skull and face: Secondary | ICD-10-CM | POA: Diagnosis not present

## 2015-12-02 DIAGNOSIS — Z9889 Other specified postprocedural states: Secondary | ICD-10-CM | POA: Diagnosis not present

## 2015-12-02 DIAGNOSIS — Z08 Encounter for follow-up examination after completed treatment for malignant neoplasm: Secondary | ICD-10-CM | POA: Diagnosis not present

## 2016-02-03 DIAGNOSIS — C4442 Squamous cell carcinoma of skin of scalp and neck: Secondary | ICD-10-CM | POA: Diagnosis not present

## 2016-02-03 DIAGNOSIS — L570 Actinic keratosis: Secondary | ICD-10-CM | POA: Diagnosis not present

## 2016-02-03 DIAGNOSIS — D1801 Hemangioma of skin and subcutaneous tissue: Secondary | ICD-10-CM | POA: Diagnosis not present

## 2016-02-03 DIAGNOSIS — C44329 Squamous cell carcinoma of skin of other parts of face: Secondary | ICD-10-CM | POA: Diagnosis not present

## 2016-02-03 DIAGNOSIS — L821 Other seborrheic keratosis: Secondary | ICD-10-CM | POA: Diagnosis not present

## 2016-02-03 DIAGNOSIS — D225 Melanocytic nevi of trunk: Secondary | ICD-10-CM | POA: Diagnosis not present

## 2016-02-03 DIAGNOSIS — D044 Carcinoma in situ of skin of scalp and neck: Secondary | ICD-10-CM | POA: Diagnosis not present

## 2016-02-03 DIAGNOSIS — Z85828 Personal history of other malignant neoplasm of skin: Secondary | ICD-10-CM | POA: Diagnosis not present

## 2016-02-28 DIAGNOSIS — I10 Essential (primary) hypertension: Secondary | ICD-10-CM | POA: Diagnosis not present

## 2016-02-28 DIAGNOSIS — Z23 Encounter for immunization: Secondary | ICD-10-CM | POA: Diagnosis not present

## 2016-02-28 DIAGNOSIS — R739 Hyperglycemia, unspecified: Secondary | ICD-10-CM | POA: Diagnosis not present

## 2016-02-28 DIAGNOSIS — E7800 Pure hypercholesterolemia, unspecified: Secondary | ICD-10-CM | POA: Diagnosis not present

## 2016-02-28 DIAGNOSIS — Z125 Encounter for screening for malignant neoplasm of prostate: Secondary | ICD-10-CM | POA: Diagnosis not present

## 2016-02-28 DIAGNOSIS — Z79899 Other long term (current) drug therapy: Secondary | ICD-10-CM | POA: Diagnosis not present

## 2016-02-28 DIAGNOSIS — E785 Hyperlipidemia, unspecified: Secondary | ICD-10-CM | POA: Diagnosis not present

## 2016-03-06 DIAGNOSIS — Z79899 Other long term (current) drug therapy: Secondary | ICD-10-CM | POA: Diagnosis not present

## 2016-03-06 DIAGNOSIS — I10 Essential (primary) hypertension: Secondary | ICD-10-CM | POA: Diagnosis not present

## 2016-03-06 DIAGNOSIS — E7800 Pure hypercholesterolemia, unspecified: Secondary | ICD-10-CM | POA: Diagnosis not present

## 2016-03-06 DIAGNOSIS — Z125 Encounter for screening for malignant neoplasm of prostate: Secondary | ICD-10-CM | POA: Diagnosis not present

## 2016-03-06 DIAGNOSIS — R739 Hyperglycemia, unspecified: Secondary | ICD-10-CM | POA: Diagnosis not present

## 2016-04-24 DIAGNOSIS — I2510 Atherosclerotic heart disease of native coronary artery without angina pectoris: Secondary | ICD-10-CM | POA: Diagnosis not present

## 2016-04-24 DIAGNOSIS — I10 Essential (primary) hypertension: Secondary | ICD-10-CM | POA: Diagnosis not present

## 2016-04-24 DIAGNOSIS — E7800 Pure hypercholesterolemia, unspecified: Secondary | ICD-10-CM | POA: Diagnosis not present

## 2016-05-01 DIAGNOSIS — L821 Other seborrheic keratosis: Secondary | ICD-10-CM | POA: Diagnosis not present

## 2016-05-01 DIAGNOSIS — D225 Melanocytic nevi of trunk: Secondary | ICD-10-CM | POA: Diagnosis not present

## 2016-05-01 DIAGNOSIS — L814 Other melanin hyperpigmentation: Secondary | ICD-10-CM | POA: Diagnosis not present

## 2016-05-01 DIAGNOSIS — L570 Actinic keratosis: Secondary | ICD-10-CM | POA: Diagnosis not present

## 2016-05-01 DIAGNOSIS — Z85828 Personal history of other malignant neoplasm of skin: Secondary | ICD-10-CM | POA: Diagnosis not present

## 2016-05-01 DIAGNOSIS — D1801 Hemangioma of skin and subcutaneous tissue: Secondary | ICD-10-CM | POA: Diagnosis not present

## 2016-06-22 DIAGNOSIS — J310 Chronic rhinitis: Secondary | ICD-10-CM | POA: Diagnosis not present

## 2016-06-22 DIAGNOSIS — C410 Malignant neoplasm of bones of skull and face: Secondary | ICD-10-CM | POA: Diagnosis not present

## 2016-06-28 DIAGNOSIS — Z85828 Personal history of other malignant neoplasm of skin: Secondary | ICD-10-CM | POA: Diagnosis not present

## 2016-06-28 DIAGNOSIS — C4442 Squamous cell carcinoma of skin of scalp and neck: Secondary | ICD-10-CM | POA: Diagnosis not present

## 2016-06-28 DIAGNOSIS — L821 Other seborrheic keratosis: Secondary | ICD-10-CM | POA: Diagnosis not present

## 2016-06-28 DIAGNOSIS — L570 Actinic keratosis: Secondary | ICD-10-CM | POA: Diagnosis not present

## 2016-07-10 DIAGNOSIS — G510 Bell's palsy: Secondary | ICD-10-CM | POA: Diagnosis not present

## 2016-07-10 DIAGNOSIS — H02234 Paralytic lagophthalmos left upper eyelid: Secondary | ICD-10-CM | POA: Diagnosis not present

## 2016-07-10 DIAGNOSIS — C410 Malignant neoplasm of bones of skull and face: Secondary | ICD-10-CM | POA: Diagnosis not present

## 2016-07-10 DIAGNOSIS — J310 Chronic rhinitis: Secondary | ICD-10-CM | POA: Diagnosis not present

## 2016-07-10 DIAGNOSIS — M952 Other acquired deformity of head: Secondary | ICD-10-CM | POA: Diagnosis not present

## 2016-07-17 DIAGNOSIS — Z85828 Personal history of other malignant neoplasm of skin: Secondary | ICD-10-CM | POA: Diagnosis not present

## 2016-07-17 DIAGNOSIS — C4442 Squamous cell carcinoma of skin of scalp and neck: Secondary | ICD-10-CM | POA: Diagnosis not present

## 2016-08-17 DIAGNOSIS — Z955 Presence of coronary angioplasty implant and graft: Secondary | ICD-10-CM | POA: Diagnosis not present

## 2016-08-17 DIAGNOSIS — Z01818 Encounter for other preprocedural examination: Secondary | ICD-10-CM | POA: Diagnosis not present

## 2016-08-17 DIAGNOSIS — H02234 Paralytic lagophthalmos left upper eyelid: Secondary | ICD-10-CM | POA: Diagnosis not present

## 2016-08-17 DIAGNOSIS — G510 Bell's palsy: Secondary | ICD-10-CM | POA: Diagnosis not present

## 2016-08-17 DIAGNOSIS — C410 Malignant neoplasm of bones of skull and face: Secondary | ICD-10-CM | POA: Diagnosis not present

## 2016-08-17 DIAGNOSIS — M952 Other acquired deformity of head: Secondary | ICD-10-CM | POA: Diagnosis not present

## 2016-08-17 DIAGNOSIS — I771 Stricture of artery: Secondary | ICD-10-CM | POA: Diagnosis not present

## 2016-08-22 DIAGNOSIS — I2510 Atherosclerotic heart disease of native coronary artery without angina pectoris: Secondary | ICD-10-CM | POA: Diagnosis not present

## 2016-08-22 DIAGNOSIS — H9190 Unspecified hearing loss, unspecified ear: Secondary | ICD-10-CM | POA: Diagnosis not present

## 2016-08-22 DIAGNOSIS — G510 Bell's palsy: Secondary | ICD-10-CM | POA: Diagnosis not present

## 2016-08-22 DIAGNOSIS — J3489 Other specified disorders of nose and nasal sinuses: Secondary | ICD-10-CM | POA: Diagnosis not present

## 2016-08-22 DIAGNOSIS — H02106 Unspecified ectropion of left eye, unspecified eyelid: Secondary | ICD-10-CM | POA: Diagnosis not present

## 2016-08-22 DIAGNOSIS — K210 Gastro-esophageal reflux disease with esophagitis: Secondary | ICD-10-CM | POA: Diagnosis not present

## 2016-08-22 DIAGNOSIS — I10 Essential (primary) hypertension: Secondary | ICD-10-CM | POA: Diagnosis not present

## 2016-08-22 DIAGNOSIS — Z7982 Long term (current) use of aspirin: Secondary | ICD-10-CM | POA: Diagnosis not present

## 2016-08-22 DIAGNOSIS — Z79899 Other long term (current) drug therapy: Secondary | ICD-10-CM | POA: Diagnosis not present

## 2016-08-22 DIAGNOSIS — E785 Hyperlipidemia, unspecified: Secondary | ICD-10-CM | POA: Diagnosis not present

## 2016-09-10 DIAGNOSIS — R739 Hyperglycemia, unspecified: Secondary | ICD-10-CM | POA: Diagnosis not present

## 2016-09-10 DIAGNOSIS — I2510 Atherosclerotic heart disease of native coronary artery without angina pectoris: Secondary | ICD-10-CM | POA: Diagnosis not present

## 2016-09-10 DIAGNOSIS — Z79899 Other long term (current) drug therapy: Secondary | ICD-10-CM | POA: Diagnosis not present

## 2016-09-10 DIAGNOSIS — C060 Malignant neoplasm of cheek mucosa: Secondary | ICD-10-CM | POA: Diagnosis not present

## 2016-09-10 DIAGNOSIS — E7800 Pure hypercholesterolemia, unspecified: Secondary | ICD-10-CM | POA: Diagnosis not present

## 2016-09-10 DIAGNOSIS — Z0000 Encounter for general adult medical examination without abnormal findings: Secondary | ICD-10-CM | POA: Diagnosis not present

## 2016-09-10 DIAGNOSIS — I10 Essential (primary) hypertension: Secondary | ICD-10-CM | POA: Diagnosis not present

## 2016-10-01 DIAGNOSIS — L570 Actinic keratosis: Secondary | ICD-10-CM | POA: Diagnosis not present

## 2016-10-01 DIAGNOSIS — L821 Other seborrheic keratosis: Secondary | ICD-10-CM | POA: Diagnosis not present

## 2016-10-01 DIAGNOSIS — D225 Melanocytic nevi of trunk: Secondary | ICD-10-CM | POA: Diagnosis not present

## 2016-10-01 DIAGNOSIS — C44519 Basal cell carcinoma of skin of other part of trunk: Secondary | ICD-10-CM | POA: Diagnosis not present

## 2016-10-01 DIAGNOSIS — Z85828 Personal history of other malignant neoplasm of skin: Secondary | ICD-10-CM | POA: Diagnosis not present

## 2016-10-17 DIAGNOSIS — I2510 Atherosclerotic heart disease of native coronary artery without angina pectoris: Secondary | ICD-10-CM | POA: Diagnosis not present

## 2016-10-17 DIAGNOSIS — C060 Malignant neoplasm of cheek mucosa: Secondary | ICD-10-CM | POA: Diagnosis not present

## 2016-10-17 DIAGNOSIS — I10 Essential (primary) hypertension: Secondary | ICD-10-CM | POA: Diagnosis not present

## 2016-10-17 DIAGNOSIS — E7800 Pure hypercholesterolemia, unspecified: Secondary | ICD-10-CM | POA: Diagnosis not present

## 2016-10-18 DIAGNOSIS — Z85828 Personal history of other malignant neoplasm of skin: Secondary | ICD-10-CM | POA: Diagnosis not present

## 2016-10-18 DIAGNOSIS — C44519 Basal cell carcinoma of skin of other part of trunk: Secondary | ICD-10-CM | POA: Diagnosis not present

## 2016-12-21 DIAGNOSIS — M952 Other acquired deformity of head: Secondary | ICD-10-CM | POA: Diagnosis not present

## 2016-12-21 DIAGNOSIS — H02234 Paralytic lagophthalmos left upper eyelid: Secondary | ICD-10-CM | POA: Diagnosis not present

## 2016-12-21 DIAGNOSIS — C410 Malignant neoplasm of bones of skull and face: Secondary | ICD-10-CM | POA: Diagnosis not present

## 2016-12-21 DIAGNOSIS — G510 Bell's palsy: Secondary | ICD-10-CM | POA: Diagnosis not present

## 2017-01-01 DIAGNOSIS — L570 Actinic keratosis: Secondary | ICD-10-CM | POA: Diagnosis not present

## 2017-01-01 DIAGNOSIS — L814 Other melanin hyperpigmentation: Secondary | ICD-10-CM | POA: Diagnosis not present

## 2017-01-01 DIAGNOSIS — Z85828 Personal history of other malignant neoplasm of skin: Secondary | ICD-10-CM | POA: Diagnosis not present

## 2017-01-01 DIAGNOSIS — L821 Other seborrheic keratosis: Secondary | ICD-10-CM | POA: Diagnosis not present

## 2017-01-01 DIAGNOSIS — D0439 Carcinoma in situ of skin of other parts of face: Secondary | ICD-10-CM | POA: Diagnosis not present

## 2017-01-01 DIAGNOSIS — D225 Melanocytic nevi of trunk: Secondary | ICD-10-CM | POA: Diagnosis not present

## 2017-01-01 DIAGNOSIS — D1801 Hemangioma of skin and subcutaneous tissue: Secondary | ICD-10-CM | POA: Diagnosis not present

## 2017-01-18 DIAGNOSIS — G510 Bell's palsy: Secondary | ICD-10-CM | POA: Diagnosis not present

## 2017-01-18 DIAGNOSIS — M952 Other acquired deformity of head: Secondary | ICD-10-CM | POA: Diagnosis not present

## 2017-03-13 DIAGNOSIS — I2510 Atherosclerotic heart disease of native coronary artery without angina pectoris: Secondary | ICD-10-CM | POA: Diagnosis not present

## 2017-03-13 DIAGNOSIS — I10 Essential (primary) hypertension: Secondary | ICD-10-CM | POA: Diagnosis not present

## 2017-03-13 DIAGNOSIS — E7800 Pure hypercholesterolemia, unspecified: Secondary | ICD-10-CM | POA: Diagnosis not present

## 2017-03-13 DIAGNOSIS — Z79899 Other long term (current) drug therapy: Secondary | ICD-10-CM | POA: Diagnosis not present

## 2017-03-13 DIAGNOSIS — R739 Hyperglycemia, unspecified: Secondary | ICD-10-CM | POA: Diagnosis not present

## 2017-03-13 DIAGNOSIS — Z125 Encounter for screening for malignant neoplasm of prostate: Secondary | ICD-10-CM | POA: Diagnosis not present

## 2017-04-08 DIAGNOSIS — Z85828 Personal history of other malignant neoplasm of skin: Secondary | ICD-10-CM | POA: Diagnosis not present

## 2017-04-08 DIAGNOSIS — L814 Other melanin hyperpigmentation: Secondary | ICD-10-CM | POA: Diagnosis not present

## 2017-04-08 DIAGNOSIS — L570 Actinic keratosis: Secondary | ICD-10-CM | POA: Diagnosis not present

## 2017-04-08 DIAGNOSIS — D1801 Hemangioma of skin and subcutaneous tissue: Secondary | ICD-10-CM | POA: Diagnosis not present

## 2017-04-08 DIAGNOSIS — L821 Other seborrheic keratosis: Secondary | ICD-10-CM | POA: Diagnosis not present

## 2017-04-08 DIAGNOSIS — D045 Carcinoma in situ of skin of trunk: Secondary | ICD-10-CM | POA: Diagnosis not present

## 2017-04-25 DIAGNOSIS — E7800 Pure hypercholesterolemia, unspecified: Secondary | ICD-10-CM | POA: Diagnosis not present

## 2017-04-25 DIAGNOSIS — Z125 Encounter for screening for malignant neoplasm of prostate: Secondary | ICD-10-CM | POA: Diagnosis not present

## 2017-04-25 DIAGNOSIS — R739 Hyperglycemia, unspecified: Secondary | ICD-10-CM | POA: Diagnosis not present

## 2017-04-25 DIAGNOSIS — I10 Essential (primary) hypertension: Secondary | ICD-10-CM | POA: Diagnosis not present

## 2017-04-25 DIAGNOSIS — Z79899 Other long term (current) drug therapy: Secondary | ICD-10-CM | POA: Diagnosis not present

## 2017-05-07 DIAGNOSIS — H43812 Vitreous degeneration, left eye: Secondary | ICD-10-CM | POA: Diagnosis not present

## 2017-05-07 DIAGNOSIS — H52223 Regular astigmatism, bilateral: Secondary | ICD-10-CM | POA: Diagnosis not present

## 2017-05-07 DIAGNOSIS — H2513 Age-related nuclear cataract, bilateral: Secondary | ICD-10-CM | POA: Diagnosis not present

## 2017-05-07 DIAGNOSIS — H5203 Hypermetropia, bilateral: Secondary | ICD-10-CM | POA: Diagnosis not present

## 2017-05-07 DIAGNOSIS — H1089 Other conjunctivitis: Secondary | ICD-10-CM | POA: Diagnosis not present

## 2017-05-07 DIAGNOSIS — H524 Presbyopia: Secondary | ICD-10-CM | POA: Diagnosis not present

## 2017-05-17 DIAGNOSIS — I10 Essential (primary) hypertension: Secondary | ICD-10-CM | POA: Diagnosis not present

## 2017-05-17 DIAGNOSIS — E782 Mixed hyperlipidemia: Secondary | ICD-10-CM | POA: Diagnosis not present

## 2017-05-17 DIAGNOSIS — I2510 Atherosclerotic heart disease of native coronary artery without angina pectoris: Secondary | ICD-10-CM | POA: Diagnosis not present

## 2017-07-08 DIAGNOSIS — D1801 Hemangioma of skin and subcutaneous tissue: Secondary | ICD-10-CM | POA: Diagnosis not present

## 2017-07-08 DIAGNOSIS — L814 Other melanin hyperpigmentation: Secondary | ICD-10-CM | POA: Diagnosis not present

## 2017-07-08 DIAGNOSIS — L570 Actinic keratosis: Secondary | ICD-10-CM | POA: Diagnosis not present

## 2017-07-08 DIAGNOSIS — Z85828 Personal history of other malignant neoplasm of skin: Secondary | ICD-10-CM | POA: Diagnosis not present

## 2017-07-08 DIAGNOSIS — D225 Melanocytic nevi of trunk: Secondary | ICD-10-CM | POA: Diagnosis not present

## 2017-07-08 DIAGNOSIS — L821 Other seborrheic keratosis: Secondary | ICD-10-CM | POA: Diagnosis not present

## 2017-07-19 DIAGNOSIS — R05 Cough: Secondary | ICD-10-CM | POA: Diagnosis not present

## 2017-07-19 DIAGNOSIS — J40 Bronchitis, not specified as acute or chronic: Secondary | ICD-10-CM | POA: Diagnosis not present

## 2017-07-31 DIAGNOSIS — C410 Malignant neoplasm of bones of skull and face: Secondary | ICD-10-CM | POA: Diagnosis not present

## 2017-07-31 DIAGNOSIS — M952 Other acquired deformity of head: Secondary | ICD-10-CM | POA: Diagnosis not present

## 2017-08-16 DIAGNOSIS — J189 Pneumonia, unspecified organism: Secondary | ICD-10-CM | POA: Diagnosis not present

## 2017-09-13 DIAGNOSIS — Z0000 Encounter for general adult medical examination without abnormal findings: Secondary | ICD-10-CM | POA: Diagnosis not present

## 2017-09-13 DIAGNOSIS — E782 Mixed hyperlipidemia: Secondary | ICD-10-CM | POA: Diagnosis not present

## 2017-09-13 DIAGNOSIS — I10 Essential (primary) hypertension: Secondary | ICD-10-CM | POA: Diagnosis not present

## 2017-09-13 DIAGNOSIS — Z79899 Other long term (current) drug therapy: Secondary | ICD-10-CM | POA: Diagnosis not present

## 2017-09-13 DIAGNOSIS — R739 Hyperglycemia, unspecified: Secondary | ICD-10-CM | POA: Diagnosis not present

## 2017-09-13 DIAGNOSIS — I2510 Atherosclerotic heart disease of native coronary artery without angina pectoris: Secondary | ICD-10-CM | POA: Diagnosis not present

## 2017-09-27 DIAGNOSIS — H02234 Paralytic lagophthalmos left upper eyelid: Secondary | ICD-10-CM | POA: Diagnosis not present

## 2017-09-27 DIAGNOSIS — C410 Malignant neoplasm of bones of skull and face: Secondary | ICD-10-CM | POA: Diagnosis not present

## 2017-09-27 DIAGNOSIS — M952 Other acquired deformity of head: Secondary | ICD-10-CM | POA: Diagnosis not present

## 2017-09-27 DIAGNOSIS — G510 Bell's palsy: Secondary | ICD-10-CM | POA: Diagnosis not present

## 2017-10-15 DIAGNOSIS — I10 Essential (primary) hypertension: Secondary | ICD-10-CM | POA: Diagnosis not present

## 2017-10-15 DIAGNOSIS — E782 Mixed hyperlipidemia: Secondary | ICD-10-CM | POA: Diagnosis not present

## 2017-10-15 DIAGNOSIS — R739 Hyperglycemia, unspecified: Secondary | ICD-10-CM | POA: Diagnosis not present

## 2017-10-15 DIAGNOSIS — Z79899 Other long term (current) drug therapy: Secondary | ICD-10-CM | POA: Diagnosis not present

## 2017-11-04 DIAGNOSIS — D044 Carcinoma in situ of skin of scalp and neck: Secondary | ICD-10-CM | POA: Diagnosis not present

## 2017-11-04 DIAGNOSIS — D225 Melanocytic nevi of trunk: Secondary | ICD-10-CM | POA: Diagnosis not present

## 2017-11-04 DIAGNOSIS — Z85828 Personal history of other malignant neoplasm of skin: Secondary | ICD-10-CM | POA: Diagnosis not present

## 2017-11-04 DIAGNOSIS — L821 Other seborrheic keratosis: Secondary | ICD-10-CM | POA: Diagnosis not present

## 2017-11-04 DIAGNOSIS — D1801 Hemangioma of skin and subcutaneous tissue: Secondary | ICD-10-CM | POA: Diagnosis not present

## 2017-11-04 DIAGNOSIS — L570 Actinic keratosis: Secondary | ICD-10-CM | POA: Diagnosis not present

## 2018-01-03 DIAGNOSIS — M952 Other acquired deformity of head: Secondary | ICD-10-CM | POA: Diagnosis not present

## 2018-01-03 DIAGNOSIS — G510 Bell's palsy: Secondary | ICD-10-CM | POA: Diagnosis not present

## 2018-01-06 DIAGNOSIS — I10 Essential (primary) hypertension: Secondary | ICD-10-CM | POA: Diagnosis not present

## 2018-01-06 DIAGNOSIS — I2510 Atherosclerotic heart disease of native coronary artery without angina pectoris: Secondary | ICD-10-CM | POA: Diagnosis not present

## 2018-01-06 DIAGNOSIS — E782 Mixed hyperlipidemia: Secondary | ICD-10-CM | POA: Diagnosis not present

## 2018-02-03 DIAGNOSIS — H02155 Paralytic ectropion of left lower eyelid: Secondary | ICD-10-CM | POA: Diagnosis not present

## 2018-02-03 DIAGNOSIS — H10232 Serous conjunctivitis, except viral, left eye: Secondary | ICD-10-CM | POA: Diagnosis not present

## 2018-02-03 DIAGNOSIS — H16142 Punctate keratitis, left eye: Secondary | ICD-10-CM | POA: Diagnosis not present

## 2018-03-12 DIAGNOSIS — D225 Melanocytic nevi of trunk: Secondary | ICD-10-CM | POA: Diagnosis not present

## 2018-03-12 DIAGNOSIS — D0439 Carcinoma in situ of skin of other parts of face: Secondary | ICD-10-CM | POA: Diagnosis not present

## 2018-03-12 DIAGNOSIS — L570 Actinic keratosis: Secondary | ICD-10-CM | POA: Diagnosis not present

## 2018-03-12 DIAGNOSIS — L821 Other seborrheic keratosis: Secondary | ICD-10-CM | POA: Diagnosis not present

## 2018-03-12 DIAGNOSIS — L814 Other melanin hyperpigmentation: Secondary | ICD-10-CM | POA: Diagnosis not present

## 2018-03-12 DIAGNOSIS — Z85828 Personal history of other malignant neoplasm of skin: Secondary | ICD-10-CM | POA: Diagnosis not present

## 2018-03-28 DIAGNOSIS — R739 Hyperglycemia, unspecified: Secondary | ICD-10-CM | POA: Diagnosis not present

## 2018-03-28 DIAGNOSIS — E782 Mixed hyperlipidemia: Secondary | ICD-10-CM | POA: Diagnosis not present

## 2018-03-28 DIAGNOSIS — I10 Essential (primary) hypertension: Secondary | ICD-10-CM | POA: Diagnosis not present

## 2018-03-28 DIAGNOSIS — Z79899 Other long term (current) drug therapy: Secondary | ICD-10-CM | POA: Diagnosis not present

## 2018-05-06 DIAGNOSIS — G510 Bell's palsy: Secondary | ICD-10-CM | POA: Diagnosis not present

## 2018-05-06 DIAGNOSIS — C410 Malignant neoplasm of bones of skull and face: Secondary | ICD-10-CM | POA: Diagnosis not present

## 2018-05-06 DIAGNOSIS — M952 Other acquired deformity of head: Secondary | ICD-10-CM | POA: Diagnosis not present

## 2018-05-06 DIAGNOSIS — H02106 Unspecified ectropion of left eye, unspecified eyelid: Secondary | ICD-10-CM | POA: Diagnosis not present

## 2018-08-27 DIAGNOSIS — H02106 Unspecified ectropion of left eye, unspecified eyelid: Secondary | ICD-10-CM | POA: Diagnosis not present

## 2018-08-27 DIAGNOSIS — M952 Other acquired deformity of head: Secondary | ICD-10-CM | POA: Diagnosis not present

## 2018-09-04 DIAGNOSIS — D044 Carcinoma in situ of skin of scalp and neck: Secondary | ICD-10-CM | POA: Diagnosis not present

## 2018-09-04 DIAGNOSIS — L570 Actinic keratosis: Secondary | ICD-10-CM | POA: Diagnosis not present

## 2018-09-04 DIAGNOSIS — L820 Inflamed seborrheic keratosis: Secondary | ICD-10-CM | POA: Diagnosis not present

## 2018-09-04 DIAGNOSIS — D0421 Carcinoma in situ of skin of right ear and external auricular canal: Secondary | ICD-10-CM | POA: Diagnosis not present

## 2018-09-04 DIAGNOSIS — C44519 Basal cell carcinoma of skin of other part of trunk: Secondary | ICD-10-CM | POA: Diagnosis not present

## 2018-09-04 DIAGNOSIS — L821 Other seborrheic keratosis: Secondary | ICD-10-CM | POA: Diagnosis not present

## 2018-09-04 DIAGNOSIS — L814 Other melanin hyperpigmentation: Secondary | ICD-10-CM | POA: Diagnosis not present

## 2018-09-04 DIAGNOSIS — Z85828 Personal history of other malignant neoplasm of skin: Secondary | ICD-10-CM | POA: Diagnosis not present

## 2018-09-04 DIAGNOSIS — D1801 Hemangioma of skin and subcutaneous tissue: Secondary | ICD-10-CM | POA: Diagnosis not present

## 2018-09-18 DIAGNOSIS — I2510 Atherosclerotic heart disease of native coronary artery without angina pectoris: Secondary | ICD-10-CM | POA: Diagnosis not present

## 2018-09-18 DIAGNOSIS — I10 Essential (primary) hypertension: Secondary | ICD-10-CM | POA: Diagnosis not present

## 2018-09-18 DIAGNOSIS — E782 Mixed hyperlipidemia: Secondary | ICD-10-CM | POA: Diagnosis not present

## 2018-10-03 DIAGNOSIS — H02155 Paralytic ectropion of left lower eyelid: Secondary | ICD-10-CM | POA: Diagnosis not present

## 2018-10-03 DIAGNOSIS — H10232 Serous conjunctivitis, except viral, left eye: Secondary | ICD-10-CM | POA: Diagnosis not present

## 2018-10-03 DIAGNOSIS — H2513 Age-related nuclear cataract, bilateral: Secondary | ICD-10-CM | POA: Diagnosis not present

## 2018-10-03 DIAGNOSIS — H16142 Punctate keratitis, left eye: Secondary | ICD-10-CM | POA: Diagnosis not present

## 2018-11-26 DIAGNOSIS — C444 Other and unspecified malignant neoplasm of skin of scalp and neck: Secondary | ICD-10-CM | POA: Diagnosis not present

## 2018-11-26 DIAGNOSIS — D045 Carcinoma in situ of skin of trunk: Secondary | ICD-10-CM | POA: Diagnosis not present

## 2018-11-26 DIAGNOSIS — D0422 Carcinoma in situ of skin of left ear and external auricular canal: Secondary | ICD-10-CM | POA: Diagnosis not present

## 2018-11-26 DIAGNOSIS — Z85828 Personal history of other malignant neoplasm of skin: Secondary | ICD-10-CM | POA: Diagnosis not present

## 2018-11-26 DIAGNOSIS — L570 Actinic keratosis: Secondary | ICD-10-CM | POA: Diagnosis not present

## 2018-11-26 DIAGNOSIS — D1801 Hemangioma of skin and subcutaneous tissue: Secondary | ICD-10-CM | POA: Diagnosis not present

## 2018-11-26 DIAGNOSIS — D225 Melanocytic nevi of trunk: Secondary | ICD-10-CM | POA: Diagnosis not present

## 2018-11-26 DIAGNOSIS — L821 Other seborrheic keratosis: Secondary | ICD-10-CM | POA: Diagnosis not present

## 2018-11-26 DIAGNOSIS — C44329 Squamous cell carcinoma of skin of other parts of face: Secondary | ICD-10-CM | POA: Diagnosis not present

## 2018-11-26 DIAGNOSIS — C4442 Squamous cell carcinoma of skin of scalp and neck: Secondary | ICD-10-CM | POA: Diagnosis not present

## 2018-12-10 DIAGNOSIS — C44329 Squamous cell carcinoma of skin of other parts of face: Secondary | ICD-10-CM | POA: Diagnosis not present

## 2018-12-10 DIAGNOSIS — D0422 Carcinoma in situ of skin of left ear and external auricular canal: Secondary | ICD-10-CM | POA: Diagnosis not present

## 2018-12-10 DIAGNOSIS — Z85828 Personal history of other malignant neoplasm of skin: Secondary | ICD-10-CM | POA: Diagnosis not present

## 2019-01-27 DIAGNOSIS — H90A21 Sensorineural hearing loss, unilateral, right ear, with restricted hearing on the contralateral side: Secondary | ICD-10-CM | POA: Diagnosis not present

## 2019-01-28 DIAGNOSIS — Z0000 Encounter for general adult medical examination without abnormal findings: Secondary | ICD-10-CM | POA: Diagnosis not present

## 2019-01-28 DIAGNOSIS — Z125 Encounter for screening for malignant neoplasm of prostate: Secondary | ICD-10-CM | POA: Diagnosis not present

## 2019-01-28 DIAGNOSIS — I10 Essential (primary) hypertension: Secondary | ICD-10-CM | POA: Diagnosis not present

## 2019-01-28 DIAGNOSIS — Z79899 Other long term (current) drug therapy: Secondary | ICD-10-CM | POA: Diagnosis not present

## 2019-01-28 DIAGNOSIS — E782 Mixed hyperlipidemia: Secondary | ICD-10-CM | POA: Diagnosis not present

## 2019-01-28 DIAGNOSIS — R739 Hyperglycemia, unspecified: Secondary | ICD-10-CM | POA: Diagnosis not present

## 2019-01-28 DIAGNOSIS — I2510 Atherosclerotic heart disease of native coronary artery without angina pectoris: Secondary | ICD-10-CM | POA: Diagnosis not present

## 2019-02-10 DIAGNOSIS — H90A21 Sensorineural hearing loss, unilateral, right ear, with restricted hearing on the contralateral side: Secondary | ICD-10-CM | POA: Diagnosis not present

## 2019-02-25 DIAGNOSIS — Z79899 Other long term (current) drug therapy: Secondary | ICD-10-CM | POA: Diagnosis not present

## 2019-02-25 DIAGNOSIS — Z125 Encounter for screening for malignant neoplasm of prostate: Secondary | ICD-10-CM | POA: Diagnosis not present

## 2019-02-25 DIAGNOSIS — R739 Hyperglycemia, unspecified: Secondary | ICD-10-CM | POA: Diagnosis not present

## 2019-02-25 DIAGNOSIS — I10 Essential (primary) hypertension: Secondary | ICD-10-CM | POA: Diagnosis not present

## 2019-02-25 DIAGNOSIS — E782 Mixed hyperlipidemia: Secondary | ICD-10-CM | POA: Diagnosis not present

## 2019-02-26 DIAGNOSIS — Z85828 Personal history of other malignant neoplasm of skin: Secondary | ICD-10-CM | POA: Diagnosis not present

## 2019-02-26 DIAGNOSIS — D225 Melanocytic nevi of trunk: Secondary | ICD-10-CM | POA: Diagnosis not present

## 2019-02-26 DIAGNOSIS — D3617 Benign neoplasm of peripheral nerves and autonomic nervous system of trunk, unspecified: Secondary | ICD-10-CM | POA: Diagnosis not present

## 2019-02-26 DIAGNOSIS — D485 Neoplasm of uncertain behavior of skin: Secondary | ICD-10-CM | POA: Diagnosis not present

## 2019-02-26 DIAGNOSIS — L821 Other seborrheic keratosis: Secondary | ICD-10-CM | POA: Diagnosis not present

## 2019-02-26 DIAGNOSIS — L814 Other melanin hyperpigmentation: Secondary | ICD-10-CM | POA: Diagnosis not present

## 2019-02-26 DIAGNOSIS — L570 Actinic keratosis: Secondary | ICD-10-CM | POA: Diagnosis not present

## 2019-03-24 DIAGNOSIS — I10 Essential (primary) hypertension: Secondary | ICD-10-CM | POA: Diagnosis not present

## 2019-03-24 DIAGNOSIS — I2510 Atherosclerotic heart disease of native coronary artery without angina pectoris: Secondary | ICD-10-CM | POA: Diagnosis not present

## 2019-03-24 DIAGNOSIS — E782 Mixed hyperlipidemia: Secondary | ICD-10-CM | POA: Diagnosis not present

## 2019-04-06 DIAGNOSIS — H16142 Punctate keratitis, left eye: Secondary | ICD-10-CM | POA: Diagnosis not present

## 2019-04-06 DIAGNOSIS — H2513 Age-related nuclear cataract, bilateral: Secondary | ICD-10-CM | POA: Diagnosis not present

## 2019-04-06 DIAGNOSIS — H02155 Paralytic ectropion of left lower eyelid: Secondary | ICD-10-CM | POA: Diagnosis not present

## 2019-04-06 DIAGNOSIS — H10233 Serous conjunctivitis, except viral, bilateral: Secondary | ICD-10-CM | POA: Diagnosis not present

## 2019-05-05 DIAGNOSIS — H1045 Other chronic allergic conjunctivitis: Secondary | ICD-10-CM | POA: Diagnosis not present

## 2019-05-05 DIAGNOSIS — H10232 Serous conjunctivitis, except viral, left eye: Secondary | ICD-10-CM | POA: Diagnosis not present

## 2019-05-05 DIAGNOSIS — H02155 Paralytic ectropion of left lower eyelid: Secondary | ICD-10-CM | POA: Diagnosis not present

## 2019-05-05 DIAGNOSIS — H16142 Punctate keratitis, left eye: Secondary | ICD-10-CM | POA: Diagnosis not present

## 2019-05-05 DIAGNOSIS — H2513 Age-related nuclear cataract, bilateral: Secondary | ICD-10-CM | POA: Diagnosis not present

## 2019-05-29 DIAGNOSIS — D1801 Hemangioma of skin and subcutaneous tissue: Secondary | ICD-10-CM | POA: Diagnosis not present

## 2019-05-29 DIAGNOSIS — L821 Other seborrheic keratosis: Secondary | ICD-10-CM | POA: Diagnosis not present

## 2019-05-29 DIAGNOSIS — D044 Carcinoma in situ of skin of scalp and neck: Secondary | ICD-10-CM | POA: Diagnosis not present

## 2019-05-29 DIAGNOSIS — D2262 Melanocytic nevi of left upper limb, including shoulder: Secondary | ICD-10-CM | POA: Diagnosis not present

## 2019-05-29 DIAGNOSIS — L570 Actinic keratosis: Secondary | ICD-10-CM | POA: Diagnosis not present

## 2019-05-29 DIAGNOSIS — D225 Melanocytic nevi of trunk: Secondary | ICD-10-CM | POA: Diagnosis not present

## 2019-05-29 DIAGNOSIS — Z85828 Personal history of other malignant neoplasm of skin: Secondary | ICD-10-CM | POA: Diagnosis not present

## 2019-06-05 DIAGNOSIS — H02234 Paralytic lagophthalmos left upper eyelid: Secondary | ICD-10-CM | POA: Diagnosis not present

## 2019-06-05 DIAGNOSIS — C410 Malignant neoplasm of bones of skull and face: Secondary | ICD-10-CM | POA: Diagnosis not present

## 2019-06-05 DIAGNOSIS — G510 Bell's palsy: Secondary | ICD-10-CM | POA: Diagnosis not present

## 2019-06-05 DIAGNOSIS — H02106 Unspecified ectropion of left eye, unspecified eyelid: Secondary | ICD-10-CM | POA: Diagnosis not present

## 2019-06-05 DIAGNOSIS — M952 Other acquired deformity of head: Secondary | ICD-10-CM | POA: Diagnosis not present

## 2019-06-22 DIAGNOSIS — Z20822 Contact with and (suspected) exposure to covid-19: Secondary | ICD-10-CM | POA: Diagnosis not present

## 2019-06-22 DIAGNOSIS — Z01812 Encounter for preprocedural laboratory examination: Secondary | ICD-10-CM | POA: Diagnosis not present

## 2019-06-24 DIAGNOSIS — H9190 Unspecified hearing loss, unspecified ear: Secondary | ICD-10-CM | POA: Diagnosis not present

## 2019-06-24 DIAGNOSIS — G510 Bell's palsy: Secondary | ICD-10-CM | POA: Diagnosis not present

## 2019-06-24 DIAGNOSIS — H02236 Paralytic lagophthalmos left eye, unspecified eyelid: Secondary | ICD-10-CM | POA: Diagnosis not present

## 2019-06-24 DIAGNOSIS — I252 Old myocardial infarction: Secondary | ICD-10-CM | POA: Diagnosis not present

## 2019-06-24 DIAGNOSIS — K219 Gastro-esophageal reflux disease without esophagitis: Secondary | ICD-10-CM | POA: Diagnosis not present

## 2019-06-24 DIAGNOSIS — I2510 Atherosclerotic heart disease of native coronary artery without angina pectoris: Secondary | ICD-10-CM | POA: Diagnosis not present

## 2019-06-24 DIAGNOSIS — Z923 Personal history of irradiation: Secondary | ICD-10-CM | POA: Diagnosis not present

## 2019-06-24 DIAGNOSIS — Z7982 Long term (current) use of aspirin: Secondary | ICD-10-CM | POA: Diagnosis not present

## 2019-06-24 DIAGNOSIS — I10 Essential (primary) hypertension: Secondary | ICD-10-CM | POA: Diagnosis not present

## 2019-06-24 DIAGNOSIS — H02204 Unspecified lagophthalmos left upper eyelid: Secondary | ICD-10-CM | POA: Diagnosis not present

## 2019-06-24 DIAGNOSIS — Z955 Presence of coronary angioplasty implant and graft: Secondary | ICD-10-CM | POA: Diagnosis not present

## 2019-06-24 DIAGNOSIS — H02155 Paralytic ectropion of left lower eyelid: Secondary | ICD-10-CM | POA: Diagnosis not present

## 2019-06-24 DIAGNOSIS — J3489 Other specified disorders of nose and nasal sinuses: Secondary | ICD-10-CM | POA: Diagnosis not present

## 2019-06-24 DIAGNOSIS — E785 Hyperlipidemia, unspecified: Secondary | ICD-10-CM | POA: Diagnosis not present

## 2019-06-24 DIAGNOSIS — Z85828 Personal history of other malignant neoplasm of skin: Secondary | ICD-10-CM | POA: Diagnosis not present

## 2019-06-24 DIAGNOSIS — H02105 Unspecified ectropion of left lower eyelid: Secondary | ICD-10-CM | POA: Diagnosis not present

## 2019-06-24 DIAGNOSIS — H16212 Exposure keratoconjunctivitis, left eye: Secondary | ICD-10-CM | POA: Diagnosis not present

## 2019-06-24 DIAGNOSIS — Z79899 Other long term (current) drug therapy: Secondary | ICD-10-CM | POA: Diagnosis not present

## 2019-07-28 DIAGNOSIS — H02155 Paralytic ectropion of left lower eyelid: Secondary | ICD-10-CM | POA: Diagnosis not present

## 2019-07-28 DIAGNOSIS — H2513 Age-related nuclear cataract, bilateral: Secondary | ICD-10-CM | POA: Diagnosis not present

## 2019-07-28 DIAGNOSIS — H10233 Serous conjunctivitis, except viral, bilateral: Secondary | ICD-10-CM | POA: Diagnosis not present

## 2019-07-28 DIAGNOSIS — H16142 Punctate keratitis, left eye: Secondary | ICD-10-CM | POA: Diagnosis not present

## 2019-08-04 DIAGNOSIS — E782 Mixed hyperlipidemia: Secondary | ICD-10-CM | POA: Diagnosis not present

## 2019-08-04 DIAGNOSIS — I2510 Atherosclerotic heart disease of native coronary artery without angina pectoris: Secondary | ICD-10-CM | POA: Diagnosis not present

## 2019-08-04 DIAGNOSIS — Z0000 Encounter for general adult medical examination without abnormal findings: Secondary | ICD-10-CM | POA: Diagnosis not present

## 2019-08-04 DIAGNOSIS — R739 Hyperglycemia, unspecified: Secondary | ICD-10-CM | POA: Diagnosis not present

## 2019-08-04 DIAGNOSIS — Z79899 Other long term (current) drug therapy: Secondary | ICD-10-CM | POA: Diagnosis not present

## 2019-08-04 DIAGNOSIS — I10 Essential (primary) hypertension: Secondary | ICD-10-CM | POA: Diagnosis not present

## 2019-08-04 DIAGNOSIS — Z1211 Encounter for screening for malignant neoplasm of colon: Secondary | ICD-10-CM | POA: Diagnosis not present

## 2019-08-05 ENCOUNTER — Telehealth: Payer: Self-pay | Admitting: Internal Medicine

## 2019-08-05 NOTE — Telephone Encounter (Signed)
We need to see labs before knowing how soon he needs to be seen. Thanks.

## 2019-08-05 NOTE — Telephone Encounter (Signed)
Did we receive an labs with the referral?

## 2019-08-05 NOTE — Telephone Encounter (Signed)
No Linda no labs sent

## 2019-08-05 NOTE — Telephone Encounter (Signed)
Brandon Clements  We received a referral for this pt and it is for a colonscopy.  It states that pt has elevated LFTs or elevated bilirubin. Pt has endo/colon recall for 11/2019. Can pt wait for his date or should be schedule sooner. Please advise

## 2019-08-25 ENCOUNTER — Encounter: Payer: Self-pay | Admitting: Internal Medicine

## 2019-08-26 ENCOUNTER — Encounter: Payer: Self-pay | Admitting: Internal Medicine

## 2019-08-28 DIAGNOSIS — L814 Other melanin hyperpigmentation: Secondary | ICD-10-CM | POA: Diagnosis not present

## 2019-08-28 DIAGNOSIS — L570 Actinic keratosis: Secondary | ICD-10-CM | POA: Diagnosis not present

## 2019-08-28 DIAGNOSIS — L821 Other seborrheic keratosis: Secondary | ICD-10-CM | POA: Diagnosis not present

## 2019-08-28 DIAGNOSIS — D225 Melanocytic nevi of trunk: Secondary | ICD-10-CM | POA: Diagnosis not present

## 2019-08-28 DIAGNOSIS — Z85828 Personal history of other malignant neoplasm of skin: Secondary | ICD-10-CM | POA: Diagnosis not present

## 2019-08-28 DIAGNOSIS — D485 Neoplasm of uncertain behavior of skin: Secondary | ICD-10-CM | POA: Diagnosis not present

## 2019-09-14 DIAGNOSIS — H16142 Punctate keratitis, left eye: Secondary | ICD-10-CM | POA: Diagnosis not present

## 2019-09-14 DIAGNOSIS — H2513 Age-related nuclear cataract, bilateral: Secondary | ICD-10-CM | POA: Diagnosis not present

## 2019-09-14 DIAGNOSIS — H02159 Paralytic ectropion of unspecified eye, unspecified eyelid: Secondary | ICD-10-CM | POA: Diagnosis not present

## 2019-09-14 DIAGNOSIS — H10233 Serous conjunctivitis, except viral, bilateral: Secondary | ICD-10-CM | POA: Diagnosis not present

## 2019-09-22 DIAGNOSIS — H2513 Age-related nuclear cataract, bilateral: Secondary | ICD-10-CM | POA: Diagnosis not present

## 2019-09-22 DIAGNOSIS — H02159 Paralytic ectropion of unspecified eye, unspecified eyelid: Secondary | ICD-10-CM | POA: Diagnosis not present

## 2019-09-22 DIAGNOSIS — E782 Mixed hyperlipidemia: Secondary | ICD-10-CM | POA: Diagnosis not present

## 2019-09-22 DIAGNOSIS — I10 Essential (primary) hypertension: Secondary | ICD-10-CM | POA: Diagnosis not present

## 2019-09-22 DIAGNOSIS — I2510 Atherosclerotic heart disease of native coronary artery without angina pectoris: Secondary | ICD-10-CM | POA: Diagnosis not present

## 2019-09-22 DIAGNOSIS — H10233 Serous conjunctivitis, except viral, bilateral: Secondary | ICD-10-CM | POA: Diagnosis not present

## 2019-09-22 DIAGNOSIS — E119 Type 2 diabetes mellitus without complications: Secondary | ICD-10-CM | POA: Diagnosis not present

## 2019-09-30 ENCOUNTER — Ambulatory Visit: Payer: Medicare Other | Admitting: Internal Medicine

## 2019-10-06 DIAGNOSIS — H10233 Serous conjunctivitis, except viral, bilateral: Secondary | ICD-10-CM | POA: Diagnosis not present

## 2019-10-06 DIAGNOSIS — H02159 Paralytic ectropion of unspecified eye, unspecified eyelid: Secondary | ICD-10-CM | POA: Diagnosis not present

## 2019-10-06 DIAGNOSIS — H2513 Age-related nuclear cataract, bilateral: Secondary | ICD-10-CM | POA: Diagnosis not present

## 2019-10-15 ENCOUNTER — Encounter: Payer: Self-pay | Admitting: Internal Medicine

## 2019-10-15 ENCOUNTER — Ambulatory Visit: Payer: PPO | Admitting: Internal Medicine

## 2019-10-15 VITALS — BP 120/78 | HR 70 | Ht 69.0 in | Wt 197.2 lb

## 2019-10-15 DIAGNOSIS — C760 Malignant neoplasm of head, face and neck: Secondary | ICD-10-CM | POA: Diagnosis not present

## 2019-10-15 DIAGNOSIS — K2270 Barrett's esophagus without dysplasia: Secondary | ICD-10-CM | POA: Diagnosis not present

## 2019-10-15 DIAGNOSIS — Z86010 Personal history of colonic polyps: Secondary | ICD-10-CM

## 2019-10-15 DIAGNOSIS — K219 Gastro-esophageal reflux disease without esophagitis: Secondary | ICD-10-CM

## 2019-10-15 NOTE — Progress Notes (Signed)
HISTORY OF PRESENT ILLNESS:  Brandon Clements is a 79 y.o. 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  He is followed in this office for GERD complicated by peptic stricture as well as long segment Barrett's esophagus.  Also, a history of adenomatous colon polyps under active surveillance.  She was last seen in this office October 15, 2014.  See that dictation for details.  He subsequently underwent colonoscopy and upper endoscopy December 07, 2014.  Colonoscopy revealed adenomatous colon polyps.  As well mild diverticulosis in the left colon.  Follow-up in 5 years recommended.  Upper endoscopy revealed long segment Barrett's esophagus.  Surveillance biopsies did not show dysplasia.  Overall his health has been stable.  For GERD he continues on omeprazole 20 mg daily.  No dysphagia.  No lower GI complaints.  He was encouraged by his to follow-up in this office for his surveillance examinations, and is motivated.  He is accompanied today by his wife Zigmund Daniel.  He has completed his Covid vaccination series  REVIEW OF SYSTEMS:  All non-GI ROS negative unless otherwise stated in the HPI except for arthritis  Past Medical History:  Diagnosis Date  . Arthritis    Right shoulder mostly and left shoulder  . Barrett's esophagus   . CAD (coronary artery disease) 2005   angioplasty with 3 stents  . Cancer ([place])    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   10/2010, squamous cell cancer  right jaw 2012    . Colon polyp    adenomatous  . GERD (gastroesophageal reflux disease)   . Hiatal hernia   . Hx of radiation therapy 2011   left cheek, [facility]  . Hx of radiation therapy 12/30/13-01/26/14   scalp 5000 cGy in 20 sessions  . Hyperlipidemia   . Hypertension   . Kidney stones   . Myocardial infarction [facility]) 2005   "mild"  . Skin cancer 11/2013   right frontal scalp,  squamous cell carcinoma, history basal cell ca  . Status post dilation of esophageal narrowing     Past Surgical History:  Procedure Laterality Date  . ANGIOPLASTY  2005   3 stents  . Cancer [place]     Skin cancer  . cardiac stent     x3  . COLONOSCOPY  2010, 2006   hx adenomatous polyps 2006/ Dr. Henrene Pastor  . COLONOSCOPY N/A 12/07/2014   Procedure: COLONOSCOPY;  Surgeon: Irene Shipper, MD;  Location: [facility];  Service: Endoscopy;  Laterality: N/A;  . ESOPHAGOGASTRODUODENOSCOPY N/A 12/07/2014   Procedure: ESOPHAGOGASTRODUODENOSCOPY (EGD);  Surgeon: Irene Shipper, MD;  Location: [facility];  Service: Endoscopy;  Laterality: N/A;  . EXTERNAL EAR SURGERY Left    left ear removal  . NECK DISSECTION  Feb 2012   right  . RADICAL NECK DISSECTION  July 2012   left neck, jaw, and ear    Social History [employer]  reports that he quit smoking about 54 years ago. His smokeless tobacco use includes chew. He reports current alcohol use of about 14.0 standard drinks of alcohol per week. He reports that he does not use drugs.  family history includes Alzheimer's disease in his mother; Bladder Cancer in his father; Skin cancer in his father.  No Known Allergies     PHYSICAL EXAMINATION: Vital signs: BP 120/78   Pulse 70   Ht  5\' 9"  (1.753 m)   Wt 197 lb 4 oz (89.5 kg)   BMI 29.13 kg/m   Constitutional: generally well-appearing, no acute distress Psychiatric: alert and oriented x3, cooperative Eyes: extraocular movements intact, anicteric, conjunctiva pink Mouth: Deformed pharynx Neck: supple no lymphadenopathy.  Evidence of prior surgery and radiation evident. Cardiovascular: heart regular rate and rhythm, no murmur Lungs: clear to auscultation bilaterally Abdomen: soft, nontender, nondistended, no obvious ascites, no peritoneal signs, normal bowel sounds, no organomegaly Rectal: Deferred until colonoscopy Extremities: no clubbing, cyanosis, or lower extremity edema  bilaterally Skin: no lesions on visible extremities Neuro: No focal deficits.  Cranial nerves intact  ASSESSMENT:  1.  History of multiple adenomatous colon polyps.  Due for surveillance.  Last examination 2016 2.  GERD complicated by peptic stricture Long segment Barrett's esophagus without dysplasia.  Asymptomatic post dilation on PPI last examination 2016.  Due for surveillance 3.  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  Surveillance colonoscopy.  The patient is HIGH RISK given his altered pharyngeal anatomy.  As previous we will perform the examination in the hospital.The nature of the procedure, as well as the risks, benefits, and alternatives were carefully and thoroughly reviewed with the patient. Ample time for discussion and questions allowed. The patient understood, was satisfied, and agreed to proceed. 2.  Surveillance upper endoscopy with biopsies.  High risk as above.  Will be performed at the hospital concurrent with colonoscopy.The nature of the procedure, as well as the risks, benefits, and alternatives were carefully and thoroughly reviewed with the patient. Ample time for discussion and questions allowed. The patient understood, was satisfied, and agreed to proceed. 3.  Reflux precautions 4.  Continue Prilosec

## 2019-10-15 NOTE — Patient Instructions (Signed)
I will call you to schedule your endoscopy/colonoscopy

## 2019-10-19 DIAGNOSIS — M952 Other acquired deformity of head: Secondary | ICD-10-CM | POA: Diagnosis not present

## 2019-10-19 DIAGNOSIS — G510 Bell's palsy: Secondary | ICD-10-CM | POA: Diagnosis not present

## 2019-10-19 DIAGNOSIS — C410 Malignant neoplasm of bones of skull and face: Secondary | ICD-10-CM | POA: Diagnosis not present

## 2019-10-19 DIAGNOSIS — H02234 Paralytic lagophthalmos left upper eyelid: Secondary | ICD-10-CM | POA: Diagnosis not present

## 2019-10-19 DIAGNOSIS — H02106 Unspecified ectropion of left eye, unspecified eyelid: Secondary | ICD-10-CM | POA: Diagnosis not present

## 2019-10-30 ENCOUNTER — Telehealth: Payer: Self-pay | Admitting: Internal Medicine

## 2019-10-30 ENCOUNTER — Telehealth: Payer: Self-pay

## 2019-10-30 DIAGNOSIS — K219 Gastro-esophageal reflux disease without esophagitis: Secondary | ICD-10-CM

## 2019-10-30 DIAGNOSIS — K2270 Barrett's esophagus without dysplasia: Secondary | ICD-10-CM

## 2019-10-30 DIAGNOSIS — Z86010 Personal history of colonic polyps: Secondary | ICD-10-CM

## 2019-10-30 NOTE — Telephone Encounter (Signed)
Patient's wife called to schedule procedure at the hospital.

## 2019-10-30 NOTE — Telephone Encounter (Signed)
Returned Ms. Burridge's call - no answer, no voicemail

## 2019-10-30 NOTE — Telephone Encounter (Signed)
Have scheduled patient's hospital case.  No answer or voicemail when called about this

## 2019-10-30 NOTE — Telephone Encounter (Signed)
Magda Paganini I think you are working on scheduling this pts hospital procedures.

## 2019-11-02 NOTE — Telephone Encounter (Signed)
Spoke with patient's wife Friday afternoon to give her the date of patient's hospital procedure.  Patient was driving and unable to take down the information at that time.  Called back today, no answer no voicemail

## 2019-11-03 NOTE — Telephone Encounter (Signed)
Called patient again to let them know I had his hospital procedure and covid testing scheduled as well as a Plenvu sample.  I called every number associated with his chart and go no answer and no voicemail.  I will continue to try to contact him.

## 2019-11-03 NOTE — Telephone Encounter (Signed)
Called patient at number provided from most recent message.  No answer no voicemail

## 2019-11-03 NOTE — Telephone Encounter (Signed)
Spoke to patient's wife and gave her the date and time for his procedure at [facility] as well as the date and time for his Covid testing prior to the procedure.  I told her I would leave the instructions and Plenvu sample up front to be picked up.  If she has questions she will call me.

## 2019-11-25 DIAGNOSIS — D492 Neoplasm of unspecified behavior of bone, soft tissue, and skin: Secondary | ICD-10-CM | POA: Diagnosis not present

## 2019-11-25 DIAGNOSIS — Z8589 Personal history of malignant neoplasm of other organs and systems: Secondary | ICD-10-CM | POA: Diagnosis not present

## 2019-12-03 DIAGNOSIS — L821 Other seborrheic keratosis: Secondary | ICD-10-CM | POA: Diagnosis not present

## 2019-12-03 DIAGNOSIS — L814 Other melanin hyperpigmentation: Secondary | ICD-10-CM | POA: Diagnosis not present

## 2019-12-03 DIAGNOSIS — L570 Actinic keratosis: Secondary | ICD-10-CM | POA: Diagnosis not present

## 2019-12-03 DIAGNOSIS — C4442 Squamous cell carcinoma of skin of scalp and neck: Secondary | ICD-10-CM | POA: Diagnosis not present

## 2019-12-03 DIAGNOSIS — Z85828 Personal history of other malignant neoplasm of skin: Secondary | ICD-10-CM | POA: Diagnosis not present

## 2019-12-03 DIAGNOSIS — D1801 Hemangioma of skin and subcutaneous tissue: Secondary | ICD-10-CM | POA: Diagnosis not present

## 2019-12-03 DIAGNOSIS — D044 Carcinoma in situ of skin of scalp and neck: Secondary | ICD-10-CM | POA: Diagnosis not present

## 2019-12-15 ENCOUNTER — Other Ambulatory Visit: Payer: Self-pay

## 2019-12-16 ENCOUNTER — Ambulatory Visit (HOSPITAL_COMMUNITY): Admit: 2019-12-16 | Payer: PPO | Admitting: Internal Medicine

## 2019-12-16 ENCOUNTER — Encounter (HOSPITAL_COMMUNITY): Payer: Self-pay

## 2019-12-16 SURGERY — ESOPHAGOGASTRODUODENOSCOPY (EGD) WITH PROPOFOL
Anesthesia: Monitor Anesthesia Care

## 2019-12-18 ENCOUNTER — Other Ambulatory Visit (HOSPITAL_COMMUNITY)
Admission: RE | Admit: 2019-12-18 | Discharge: 2019-12-18 | Disposition: A | Discharge status: Home / Self Care | Payer: PPO | Source: Ambulatory Visit | Attending: Internal Medicine | Admitting: Internal Medicine

## 2019-12-18 DIAGNOSIS — Z20822 Contact with and (suspected) exposure to covid-19: Secondary | ICD-10-CM | POA: Diagnosis not present

## 2019-12-18 DIAGNOSIS — Z01812 Encounter for preprocedural laboratory examination: Secondary | ICD-10-CM | POA: Diagnosis not present

## 2019-12-18 LAB — SARS CORONAVIRUS 2 (TAT 6-24 HRS): SARS Coronavirus 2: NEGATIVE

## 2019-12-21 ENCOUNTER — Telehealth: Payer: Self-pay

## 2019-12-21 ENCOUNTER — Other Ambulatory Visit: Payer: Self-pay

## 2019-12-21 DIAGNOSIS — K219 Gastro-esophageal reflux disease without esophagitis: Secondary | ICD-10-CM

## 2019-12-21 DIAGNOSIS — Z86010 Personal history of colonic polyps: Secondary | ICD-10-CM

## 2019-12-21 DIAGNOSIS — K2270 Barrett's esophagus without dysplasia: Secondary | ICD-10-CM

## 2019-12-21 NOTE — Telephone Encounter (Signed)
Hospital orders put in

## 2019-12-21 NOTE — Telephone Encounter (Signed)
-----   Message from Louanna Raw, RN sent at 12/21/2019 10:36 AM EDT ----- Regarding: orders needed Good morning Magda Paganini This patient has EGD/Colonoscopy scheduled with Dr. Henrene Pastor 12/22/19 at [facility]. Please enter orders for this procedure. Thank you Sonia Baller

## 2019-12-22 ENCOUNTER — Other Ambulatory Visit: Payer: Self-pay

## 2019-12-22 ENCOUNTER — Encounter (HOSPITAL_COMMUNITY): Payer: Self-pay | Admitting: Internal Medicine

## 2019-12-22 ENCOUNTER — Ambulatory Visit (HOSPITAL_COMMUNITY): Payer: PPO | Admitting: Anesthesiology

## 2019-12-22 ENCOUNTER — Encounter (HOSPITAL_COMMUNITY)
Admission: RE | Disposition: A | Discharge status: Home / Self Care | Payer: Self-pay | Source: Home / Self Care | Attending: Internal Medicine

## 2019-12-22 ENCOUNTER — Ambulatory Visit (HOSPITAL_COMMUNITY)
Admission: RE | Admit: 2019-12-22 | Discharge: 2019-12-22 | Disposition: A | Discharge status: Home / Self Care | Payer: PPO | Attending: Internal Medicine | Admitting: Internal Medicine

## 2019-12-22 ENCOUNTER — Ambulatory Visit: Admit: 2019-12-22 | Payer: PPO | Admitting: Internal Medicine

## 2019-12-22 DIAGNOSIS — I252 Old myocardial infarction: Secondary | ICD-10-CM | POA: Diagnosis not present

## 2019-12-22 DIAGNOSIS — K648 Other hemorrhoids: Secondary | ICD-10-CM | POA: Diagnosis not present

## 2019-12-22 DIAGNOSIS — M19011 Primary osteoarthritis, right shoulder: Secondary | ICD-10-CM | POA: Diagnosis not present

## 2019-12-22 DIAGNOSIS — Z85828 Personal history of other malignant neoplasm of skin: Secondary | ICD-10-CM | POA: Insufficient documentation

## 2019-12-22 DIAGNOSIS — K2270 Barrett's esophagus without dysplasia: Secondary | ICD-10-CM | POA: Diagnosis not present

## 2019-12-22 DIAGNOSIS — D123 Benign neoplasm of transverse colon: Secondary | ICD-10-CM | POA: Insufficient documentation

## 2019-12-22 DIAGNOSIS — Z8719 Personal history of other diseases of the digestive system: Secondary | ICD-10-CM | POA: Insufficient documentation

## 2019-12-22 DIAGNOSIS — Z923 Personal history of irradiation: Secondary | ICD-10-CM | POA: Insufficient documentation

## 2019-12-22 DIAGNOSIS — K649 Unspecified hemorrhoids: Secondary | ICD-10-CM | POA: Diagnosis not present

## 2019-12-22 DIAGNOSIS — Z13810 Encounter for screening for upper gastrointestinal disorder: Secondary | ICD-10-CM | POA: Diagnosis not present

## 2019-12-22 DIAGNOSIS — M19012 Primary osteoarthritis, left shoulder: Secondary | ICD-10-CM | POA: Insufficient documentation

## 2019-12-22 DIAGNOSIS — I2510 Atherosclerotic heart disease of native coronary artery without angina pectoris: Secondary | ICD-10-CM | POA: Insufficient documentation

## 2019-12-22 DIAGNOSIS — K5730 Diverticulosis of large intestine without perforation or abscess without bleeding: Secondary | ICD-10-CM | POA: Insufficient documentation

## 2019-12-22 DIAGNOSIS — Z87891 Personal history of nicotine dependence: Secondary | ICD-10-CM | POA: Insufficient documentation

## 2019-12-22 DIAGNOSIS — K219 Gastro-esophageal reflux disease without esophagitis: Secondary | ICD-10-CM

## 2019-12-22 DIAGNOSIS — I10 Essential (primary) hypertension: Secondary | ICD-10-CM | POA: Insufficient documentation

## 2019-12-22 DIAGNOSIS — Z955 Presence of coronary angioplasty implant and graft: Secondary | ICD-10-CM | POA: Diagnosis not present

## 2019-12-22 DIAGNOSIS — Z1211 Encounter for screening for malignant neoplasm of colon: Secondary | ICD-10-CM | POA: Insufficient documentation

## 2019-12-22 DIAGNOSIS — E785 Hyperlipidemia, unspecified: Secondary | ICD-10-CM | POA: Diagnosis not present

## 2019-12-22 DIAGNOSIS — D122 Benign neoplasm of ascending colon: Secondary | ICD-10-CM

## 2019-12-22 DIAGNOSIS — K449 Diaphragmatic hernia without obstruction or gangrene: Secondary | ICD-10-CM | POA: Diagnosis not present

## 2019-12-22 DIAGNOSIS — Z86010 Personal history of colonic polyps: Secondary | ICD-10-CM

## 2019-12-22 DIAGNOSIS — K635 Polyp of colon: Secondary | ICD-10-CM | POA: Diagnosis not present

## 2019-12-22 HISTORY — PX: ESOPHAGOGASTRODUODENOSCOPY (EGD) WITH PROPOFOL: SHX5813

## 2019-12-22 HISTORY — PX: POLYPECTOMY: SHX5525

## 2019-12-22 HISTORY — PX: BIOPSY: SHX5522

## 2019-12-22 HISTORY — PX: COLONOSCOPY WITH PROPOFOL: SHX5780

## 2019-12-22 LAB — GLUCOSE, CAPILLARY: Glucose-Capillary: 119 mg/dL — ABNORMAL HIGH (ref 70–99)

## 2019-12-22 SURGERY — ESOPHAGOGASTRODUODENOSCOPY (EGD) WITH PROPOFOL
Anesthesia: Monitor Anesthesia Care

## 2019-12-22 SURGERY — EGD (ESOPHAGOGASTRODUODENOSCOPY)
Anesthesia: Monitor Anesthesia Care

## 2019-12-22 MED ORDER — PROPOFOL 500 MG/50ML IV EMUL
INTRAVENOUS | Status: AC
  Filled 2019-12-22: qty 50

## 2019-12-22 MED ORDER — SODIUM CHLORIDE 0.9 % IV SOLN: INTRAVENOUS | Status: DC

## 2019-12-22 MED ORDER — LACTATED RINGERS IV SOLN
INTRAVENOUS | Status: AC | PRN
  Administered 2019-12-22: 1000 mL via INTRAVENOUS

## 2019-12-22 MED ORDER — PROPOFOL 10 MG/ML IV BOLUS
INTRAVENOUS | Status: AC
  Filled 2019-12-22: qty 20

## 2019-12-22 MED ORDER — LIDOCAINE 2% (20 MG/ML) 5 ML SYRINGE
INTRAMUSCULAR | Status: DC | PRN
  Administered 2019-12-22: 100 mg via INTRAVENOUS

## 2019-12-22 MED ORDER — PROPOFOL 500 MG/50ML IV EMUL
INTRAVENOUS | Status: DC | PRN
  Administered 2019-12-22: 100 ug/kg/min via INTRAVENOUS

## 2019-12-22 MED ORDER — PROPOFOL 10 MG/ML IV BOLUS
INTRAVENOUS | Status: DC | PRN
  Administered 2019-12-22 (×6): 20 mg via INTRAVENOUS

## 2019-12-22 SURGICAL SUPPLY — 25 items

## 2019-12-22 NOTE — Discharge Instructions (Signed)
YOU HAD AN ENDOSCOPIC PROCEDURE TODAY: Refer to the procedure report and other information in the discharge instructions given to you for any specific questions about what was found during the examination. If this information does not answer your questions, please call [place] office at [phone] to clarify.   YOU SHOULD EXPECT: Some feelings of bloating in the abdomen. Passage of more gas than usual. Walking can help get rid of the air that was put into your GI tract during the procedure and reduce the bloating. If you had a lower endoscopy (such as a colonoscopy or flexible sigmoidoscopy) you may notice spotting of blood in your stool or on the toilet paper. Some abdominal soreness may be present for a day or two, also.  DIET: Your first meal following the procedure should be a light meal and then it is ok to progress to your normal diet. A half-sandwich or bowl of soup is an example of a good first meal. Heavy or fried foods are harder to digest and may make you feel nauseous or bloated. Drink plenty of fluids but you should avoid alcoholic beverages for 24 hours. If you had a esophageal dilation, please see attached instructions for diet.    ACTIVITY: Your care partner should take you home directly after the procedure. You should plan to take it easy, moving slowly for the rest of the day. You can resume normal activity the day after the procedure however YOU SHOULD NOT DRIVE, use power tools, machinery or perform tasks that involve climbing or major physical exertion for 24 hours (because of the sedation medicines used during the test).   SYMPTOMS TO REPORT IMMEDIATELY: A gastroenterologist can be reached at any hour. Please call [phone]  for any of the following symptoms:  Following lower endoscopy (colonoscopy, flexible sigmoidoscopy) Excessive amounts of blood in the stool  Significant tenderness, worsening of abdominal pains  Swelling of the abdomen that is new, acute  Fever of 100 or  higher  Following upper endoscopy (EGD, EUS, ERCP, esophageal dilation) Vomiting of blood or coffee ground material  New, significant abdominal pain  New, significant chest pain or pain under the shoulder blades  Painful or persistently difficult swallowing  New shortness of breath  Black, tarry-looking or red, bloody stools  FOLLOW UP:  If any biopsies were taken you will be contacted by phone or by letter within the next 1-3 weeks. Call [phone]  if you have not heard about the biopsies in 3 weeks.  Please also call with any specific questions about appointments or follow up tests.YOU HAD AN ENDOSCOPIC PROCEDURE TODAY: Refer to the procedure report and other information in the discharge instructions given to you for any specific questions about what was found during the examination. If this information does not answer your questions, please call [place] office at [phone] to clarify.   YOU SHOULD EXPECT: Some feelings of bloating in the abdomen. Passage of more gas than usual. Walking can help get rid of the air that was put into your GI tract during the procedure and reduce the bloating. If you had a lower endoscopy (such as a colonoscopy or flexible sigmoidoscopy) you may notice spotting of blood in your stool or on the toilet paper. Some abdominal soreness may be present for a day or two, also.  DIET: Your first meal following the procedure should be a light meal and then it is ok to progress to your normal diet. A half-sandwich or bowl of soup is an example of a   good first meal. Heavy or fried foods are harder to digest and may make you feel nauseous or bloated. Drink plenty of fluids but you should avoid alcoholic beverages for 24 hours. If you had a esophageal dilation, please see attached instructions for diet.    ACTIVITY: Your care partner should take you home directly after the procedure. You should plan to take it easy, moving slowly for the rest of the day. You can resume  normal activity the day after the procedure however YOU SHOULD NOT DRIVE, use power tools, machinery or perform tasks that involve climbing or major physical exertion for 24 hours (because of the sedation medicines used during the test).   SYMPTOMS TO REPORT IMMEDIATELY: A gastroenterologist can be reached at any hour. Please call [phone]  for any of the following symptoms:  Following lower endoscopy (colonoscopy, flexible sigmoidoscopy) Excessive amounts of blood in the stool  Significant tenderness, worsening of abdominal pains  Swelling of the abdomen that is new, acute  Fever of 100 or higher  Following upper endoscopy (EGD, EUS, ERCP, esophageal dilation) Vomiting of blood or coffee ground material  New, significant abdominal pain  New, significant chest pain or pain under the shoulder blades  Painful or persistently difficult swallowing  New shortness of breath  Black, tarry-looking or red, bloody stools  FOLLOW UP:  If any biopsies were taken you will be contacted by phone or by letter within the next 1-3 weeks. Call [phone]  if you have not heard about the biopsies in 3 weeks.  Please also call with any specific questions about appointments or follow up tests. 

## 2019-12-22 NOTE — Anesthesia Procedure Notes (Signed)
Date/Time: 12/22/2019 8:47 AM Performed by: Sharlette Dense, CRNA Oxygen Delivery Method: Simple face mask

## 2019-12-22 NOTE — Transfer of Care (Signed)
Immediate Anesthesia Transfer of Care Note  Patient: Brandon Clements  Procedure(s) Performed: ESOPHAGOGASTRODUODENOSCOPY (EGD) WITH PROPOFOL (N/A ) COLONOSCOPY WITH PROPOFOL (N/A ) POLYPECTOMY  Patient Location: Endoscopy Unit  Anesthesia Type:MAC  Level of Consciousness: drowsy  Airway & Oxygen Therapy: Patient Spontanous Breathing and Patient connected to face mask oxygen  Post-op Assessment: Report given to RN and Post -op Vital signs reviewed and stable  Post vital signs: Reviewed and stable  Last Vitals:  Vitals Value Taken Time  BP    Temp    Pulse 51 12/22/19 1000  Resp    SpO2 98 % 12/22/19 1000  Vitals shown include unvalidated device data.  Last Pain:  Vitals:   12/22/19 0809  TempSrc: Oral  PainSc: 0-No pain         Complications: No complications documented.

## 2019-12-22 NOTE — Anesthesia Preprocedure Evaluation (Addendum)
Anesthesia Evaluation  Patient identified by MRN, date of birth, ID band Patient awake    Reviewed: Allergy & Precautions, NPO status , Patient's Chart, lab work & pertinent test results  Airway Mallampati: III  TM Distance: <3 FB Neck ROM: Full  Mouth opening: Limited Mouth Opening Comment: Difficult airway secondary to radiation and previous surgeries on jaw Dental no notable dental hx.    Pulmonary neg pulmonary ROS, former smoker,    Pulmonary exam normal breath sounds clear to auscultation       Cardiovascular hypertension, Pt. on medications and Pt. on home beta blockers + CAD, + Past MI and + Cardiac Stents  Normal cardiovascular exam Rhythm:Regular Rate:Normal     Neuro/Psych negative neurological ROS  negative psych ROS   GI/Hepatic Neg liver ROS, GERD  Medicated,  Endo/Other  negative endocrine ROS  Renal/GU negative Renal ROS  negative genitourinary   Musculoskeletal negative musculoskeletal ROS (+)   Abdominal   Peds negative pediatric ROS (+)  Hematology negative hematology ROS (+)   Anesthesia Other Findings   Reproductive/Obstetrics negative OB ROS                           Anesthesia Physical Anesthesia Plan  ASA: III  Anesthesia Plan: MAC   Post-op Pain Management:    Induction: Intravenous  PONV Risk Score and Plan: 0  Airway Management Planned: Simple Face Mask  Additional Equipment:   Intra-op Plan:   Post-operative Plan:   Informed Consent: I have reviewed the patients History and Physical, chart, labs and discussed the procedure including the risks, benefits and alternatives for the proposed anesthesia with the patient or authorized representative who has indicated his/her understanding and acceptance.     Dental advisory given  Plan Discussed with: CRNA and Surgeon  Anesthesia Plan Comments:         Anesthesia Quick Evaluation

## 2019-12-22 NOTE — H&P (Signed)
HISTORY OF PRESENT ILLNESS:  Brandon Clements is a 79 y.o. 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  He is followed in this office for GERD complicated by peptic stricture as well as long segment Barrett's esophagus.  Also, a history of adenomatous colon polyps under active surveillance.  She was last seen in this office October 15, 2014.  See that dictation for details.  He subsequently underwent colonoscopy and upper endoscopy December 07, 2014.  Colonoscopy revealed adenomatous colon polyps.  As well mild diverticulosis in the left colon.  Follow-up in 5 years recommended.  Upper endoscopy revealed long segment Barrett's esophagus.  Surveillance biopsies did not show dysplasia.  Overall his health has been stable.  For GERD he continues on omeprazole 20 mg daily.  No dysphagia.  No lower GI complaints.  He was encouraged by his to follow-up in this office for his surveillance examinations, and is motivated.  He is accompanied today by his wife Zigmund Daniel.  He has completed his Covid vaccination series  REVIEW OF SYSTEMS:  All non-GI ROS negative unless otherwise stated in the HPI except for arthritis      Past Medical History:  Diagnosis Date  . Arthritis    Right shoulder mostly and left shoulder  . Barrett's esophagus   . CAD (coronary artery disease) 2005   angioplasty with 3 stents  . 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 Colon polyp    adenomatous  . GERD (gastroesophageal reflux disease)   . Hiatal hernia   . Hx of radiation therapy 2011   left cheek, [facility]  . Hx of radiation therapy 12/30/13-01/26/14   scalp 5000 cGy in 20 sessions  . Hyperlipidemia   . Hypertension   . Kidney stones   . Myocardial infarction [facility]) 2005   "mild"  . Skin cancer 11/2013    right frontal scalp, squamous cell carcinoma, history basal cell ca  . Status post dilation of esophageal narrowing          Past Surgical History:  Procedure Laterality Date  . ANGIOPLASTY  2005   3 stents  . Cancer [place]     Skin cancer  . cardiac stent     x3  . COLONOSCOPY  2010, 2006   hx adenomatous polyps 2006/ Dr. Henrene Pastor  . COLONOSCOPY N/A 12/07/2014   Procedure: COLONOSCOPY;  Surgeon: Irene Shipper, MD;  Location: [facility];  Service: Endoscopy;  Laterality: N/A;  . ESOPHAGOGASTRODUODENOSCOPY N/A 12/07/2014   Procedure: ESOPHAGOGASTRODUODENOSCOPY (EGD);  Surgeon: Irene Shipper, MD;  Location: [facility];  Service: Endoscopy;  Laterality: N/A;  . EXTERNAL EAR SURGERY Left    left ear removal  . NECK DISSECTION  Feb 2012   right  . RADICAL NECK DISSECTION  July 2012   left neck, jaw, and ear    Social History [employer]  reports that he quit smoking about 54 years ago. His smokeless tobacco use includes chew. He reports current alcohol use of about 14.0 standard drinks of alcohol per week. He reports that he does not use drugs.  family history includes Alzheimer's disease in his mother; Bladder Cancer in his father; Skin cancer in his father.  No Known Allergies     PHYSICAL EXAMINATION: Vital signs:  BP 120/78   Pulse 70   Ht 5\' 9"  (1.753 m)   Wt 197 lb 4 oz (89.5 kg)   BMI 29.13 kg/m   Constitutional: generally well-appearing, no acute distress Psychiatric: alert and oriented x3, cooperative Eyes: extraocular movements intact, anicteric, conjunctiva pink Mouth: Deformed pharynx Neck: supple no lymphadenopathy.  Evidence of prior surgery and radiation evident. Cardiovascular: heart regular rate and rhythm, no murmur Lungs: clear to auscultation bilaterally Abdomen: soft, nontender, nondistended, no obvious ascites, no peritoneal signs, normal bowel sounds, no organomegaly Rectal: Deferred until colonoscopy Extremities: no  clubbing, cyanosis, or lower extremity edema bilaterally Skin: no lesions on visible extremities Neuro: No focal deficits.  Cranial nerves intact  ASSESSMENT:  1.  History of multiple adenomatous colon polyps.  Due for surveillance.  Last examination 2016 2.  GERD complicated by peptic stricture Long segment Barrett's esophagus without dysplasia.  Asymptomatic post dilation on PPI last examination 2016.  Due for surveillance 3.  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  Surveillance colonoscopy.  The patient is HIGH RISK given his altered pharyngeal anatomy.  As previous we will perform the examination in the hospital.The nature of the procedure, as well as the risks, benefits, and alternatives were carefully and thoroughly reviewed with the patient. Ample time for discussion and questions allowed. The patient understood, was satisfied, and agreed to proceed. 2.  Surveillance upper endoscopy with biopsies.  High risk as above.  Will be performed at the hospital concurrent with colonoscopy.The nature of the procedure, as well as the risks, benefits, and alternatives were carefully and thoroughly reviewed with the patient. Ample time for discussion and questions allowed. The patient understood, was satisfied, and agreed to proceed. 3.  Reflux precautions 4.  Continue Prilosec   GI ATTENDING  Patient presents today for surveillance colonoscopy due to history of adenomatous colon polyps and surveillance upper endoscopy due to history of Barrett's esophagus.  There have been no interval changes since his comprehensive office evaluation as listed above.  I have evaluated the patient at the bedside this morning preprocedure.  He had no issues with his prep.  All questions answered to his satisfaction.  Docia Chuck. Geri Seminole., M.D. [facility] Division of Gastroenterology

## 2019-12-22 NOTE — Op Note (Signed)
[facility] Patient Name: Brandon Clements Procedure Date: 12/22/2019 MRN: [id number] Attending MD: Docia Chuck. Henrene Pastor , MD Date of Birth: 30-Jul-1940 CSN: [id number] Age: 79 Admit Type: Outpatient Procedure:                Colonoscopy with cold snare polypectomy x 2 Indications:              High risk colon cancer surveillance: Personal                            history of non-advanced adenomas. Previous                            examinations 2006, 2010, 2016 Providers:                Docia Chuck. Henrene Pastor, MD, Cleda Daub, RN, Tyrone Apple, Technician, Danley Danker, CRNA Referring MD:             Fulton Reek, MD Medicines:                Monitored Anesthesia Care Complications:            No immediate complications. Estimated blood loss:                            None. Estimated Blood Loss:     Estimated blood loss: none. Procedure:                Pre-Anesthesia Assessment:                           - Prior to the procedure, a History and Physical                            was performed, and patient medications and                            allergies were reviewed. The patient's tolerance of                            previous anesthesia was also reviewed. The risks                            and benefits of the procedure and the sedation                            options and risks were discussed with the patient.                            All questions were answered, and informed consent                            was obtained. Prior Anticoagulants: The patient has  taken no previous anticoagulant or antiplatelet                            agents. ASA Grade Assessment: III - A patient with                            severe systemic disease. After reviewing the risks                            and benefits, the patient was deemed in                            satisfactory condition to undergo the procedure.                            After obtaining informed consent, the colonoscope                            was passed under direct vision. Throughout the                            procedure, the patient's blood pressure, pulse, and                            oxygen saturations were monitored continuously. The                            CF-HQ190L ([id number]) Olympus colonoscope was                            introduced through the anus and advanced to the the                            cecum, identified by appendiceal orifice and                            ileocecal valve. The terminal ileum, ileocecal                            valve, appendiceal orifice, and rectum were                            photographed. The quality of the bowel preparation                            was excellent. The colonoscopy was performed                            without difficulty. The patient tolerated the                            procedure well. The bowel preparation used was  SUPREP via split dose instruction. Scope In: 9:00:44 AM Scope Out: 9:24:16 AM Scope Withdrawal Time: 0 hours 21 minutes 37 seconds  Total Procedure Duration: 0 hours 23 minutes 32 seconds  Findings:      The terminal ileum appeared normal.      Two polyps were found in the transverse colon and ascending colon. The       polyps were 2 to 3 mm in size. These polyps were removed with a cold       snare. Resection and retrieval were complete.      Multiple small-mouthed diverticula were found in the left colon.      Internal hemorrhoids were found during retroflexion.      The exam was otherwise without abnormality on direct and retroflexion       views. Impression:               - The examined portion of the ileum was normal.                           - Two 2 to 3 mm polyps in the transverse colon and                            in the ascending colon, removed with a cold snare.                            Resected and  retrieved.                           - Diverticulosis in the left colon.                           - Internal hemorrhoids.                           - The examination was otherwise normal on direct                            and retroflexion views. Moderate Sedation:      none Recommendation:           - Repeat colonoscopy is not recommended for                            surveillance.                           - Patient has a contact number available for                            emergencies. The signs and symptoms of potential                            delayed complications were discussed with the                            patient. Return to normal activities tomorrow.  Written discharge instructions were provided to the                            patient.                           - Resume previous diet.                           - Continue present medications.                           - Await pathology results. Procedure Code(s):        --- Professional ---                           [phone], Colonoscopy, flexible; with removal of                            tumor(s), polyp(s), or other lesion(s) by snare                            technique Diagnosis Code(s):        --- Professional ---                           Z86.010, Personal history of colonic polyps                           K64.8, Other hemorrhoids                           K63.5, Polyp of colon                           K57.30, Diverticulosis of large intestine without                            perforation or abscess without bleeding CPT copyright 2019 [employer]. All rights reserved. The codes documented in this report are preliminary and upon coder review may  be revised to meet current compliance requirements. Docia Chuck. Henrene Pastor, MD 12/22/2019 9:58:58 AM This report has been signed electronically. Number of Addenda: 0

## 2019-12-22 NOTE — Op Note (Signed)
[facility] Patient Name: Brandon Clements Procedure Date: 12/22/2019 MRN: [id number] Attending MD: Docia Chuck. Henrene Pastor , MD Date of Birth: [birth year] CSN: [id number] Age: 79 Admit Type: Outpatient Procedure:                Upper GI endoscopy with biopsies Indications:              Surveillance for malignancy due to personal history                            of Barrett's esophagus. Previous examination 2016 Providers:                Docia Chuck. Henrene Pastor, MD, Cleda Daub, RN, Tyrone Apple, Technician, Danley Danker, CRNA Referring MD:             Fulton Reek, MD Medicines:                Monitored Anesthesia Care Complications:            No immediate complications. Estimated Blood Loss:     Estimated blood loss: none. Procedure:                Pre-Anesthesia Assessment:                           - Prior to the procedure, a History and Physical                            was performed, and patient medications and                            allergies were reviewed. The patient's tolerance of                            previous anesthesia was also reviewed. The risks                            and benefits of the procedure and the sedation                            options and risks were discussed with the patient.                            All questions were answered, and informed consent                            was obtained. Prior Anticoagulants: The patient has                            taken no previous anticoagulant or antiplatelet                            agents. ASA Grade Assessment: III - A patient with  severe systemic disease. After reviewing the risks                            and benefits, the patient was deemed in                            satisfactory condition to undergo the procedure.                           After obtaining informed consent, the endoscope was                            passed under  direct vision. Throughout the                            procedure, the patient's blood pressure, pulse, and                            oxygen saturations were monitored continuously. The                            [id number]-H190 ([id number]) Olympus gastroscope was                            introduced through the mouth, and advanced to the                            second part of duodenum. The upper GI endoscopy was                            accomplished without difficulty. The patient                            tolerated the procedure well. Scope In: Scope Out: Findings:      There were esophageal mucosal changes secondary to established       long-segment Barrett's disease present in the lower third of the       esophagus. The maximum longitudinal extent of these mucosal changes was       9 cm in length (C9 M9) with multiple squamous islands. The mucosa was       biopsied with a cold large-capacity forceps for histology in 4 quadrants       at intervals of 2 cm in the lower third of the esophagus. A total of 3       specimen bottles were sent to pathology.      The stomach was normal, save a large hiatal hernia and several benign       fundic gland type polyps.      The examined duodenum was normal.      The cardia and gastric fundus were normal on retroflexion. Impression:               - Esophageal mucosal changes secondary to                            established long-segment Barrett's disease.  Biopsied.                           - Large hiatal hernia benign fundic gland type                            polyps. Otherwise normal stomach.                           - Normal examined duodenum. Moderate Sedation:      none Recommendation:           - Patient has a contact number available for                            emergencies. The signs and symptoms of potential                            delayed complications were discussed with the                             patient. Return to normal activities tomorrow.                            Written discharge instructions were provided to the                            patient.                           - Resume previous diet.                           - Continue present medications.                           -Follow-up biopsies                           -Consider EGD in 5 years if no dysplasia on biopsies Procedure Code(s):        --- Professional ---                           [phone], Esophagogastroduodenoscopy, flexible,                            transoral; with biopsy, single or multiple Diagnosis Code(s):        --- Professional ---                           K22.70, Barrett's esophagus without dysplasia CPT copyright 2019 [employer] Association. All rights reserved. The codes documented in this report are preliminary and upon coder review may  be revised to meet current compliance requirements. Docia Chuck. Henrene Pastor, MD 12/22/2019 10:05:02 AM This report has been signed electronically. Number of Addenda: 0

## 2019-12-23 ENCOUNTER — Encounter (HOSPITAL_COMMUNITY): Payer: Self-pay | Admitting: Internal Medicine

## 2019-12-23 ENCOUNTER — Encounter: Payer: Self-pay | Admitting: Internal Medicine

## 2019-12-23 LAB — SURGICAL PATHOLOGY

## 2019-12-23 NOTE — Anesthesia Postprocedure Evaluation (Signed)
Anesthesia Post Note  Patient: Brandon Clements  Procedure(s) Performed: ESOPHAGOGASTRODUODENOSCOPY (EGD) WITH PROPOFOL (N/A ) COLONOSCOPY WITH PROPOFOL (N/A ) POLYPECTOMY     Patient location during evaluation: PACU Anesthesia Type: MAC Level of consciousness: awake and alert Pain management: pain level controlled Vital Signs Assessment: post-procedure vital signs reviewed and stable Respiratory status: spontaneous breathing, nonlabored ventilation, respiratory function stable and patient connected to nasal cannula oxygen Cardiovascular status: stable and blood pressure returned to baseline Postop Assessment: no apparent nausea or vomiting Anesthetic complications: no   No complications documented.  Last Vitals:  Vitals:   12/22/19 1010 12/22/19 1020  BP: 115/65 (!) 115/59  Pulse: (!) 46 (!) 49  Resp: 11 12  Temp:    SpO2: 100% 93%    Last Pain:  Vitals:   12/22/19 1020  TempSrc:   PainSc: 0-No pain                 Starlena Beil S

## 2020-01-06 DIAGNOSIS — D492 Neoplasm of unspecified behavior of bone, soft tissue, and skin: Secondary | ICD-10-CM | POA: Diagnosis not present

## 2020-01-06 DIAGNOSIS — Z85830 Personal history of malignant neoplasm of bone: Secondary | ICD-10-CM | POA: Diagnosis not present

## 2020-01-06 DIAGNOSIS — Z8589 Personal history of malignant neoplasm of other organs and systems: Secondary | ICD-10-CM | POA: Diagnosis not present

## 2020-01-18 ENCOUNTER — Ambulatory Visit: Payer: PPO | Attending: Internal Medicine

## 2020-01-18 DIAGNOSIS — Z23 Encounter for immunization: Secondary | ICD-10-CM

## 2020-01-18 NOTE — Progress Notes (Signed)
   Covid-19 Vaccination Clinic  Name:  MARKY BURESH    MRN: [id number] DOB: 01-Jan-1941  01/18/2020  Mr. Deis was observed post Covid-19 immunization for 15 minutes without incident. He was provided with Vaccine Information Sheet and instruction to access the V-Safe system.   Mr. Sobel was instructed to call 911 with any severe reactions post vaccine: Marland Kitchen Difficulty breathing  . Swelling of face and throat  . A fast heartbeat  . A bad rash all over body  . Dizziness and weakness

## 2020-02-03 DIAGNOSIS — R531 Weakness: Secondary | ICD-10-CM | POA: Diagnosis not present

## 2020-02-03 DIAGNOSIS — Z79899 Other long term (current) drug therapy: Secondary | ICD-10-CM | POA: Diagnosis not present

## 2020-02-03 DIAGNOSIS — E782 Mixed hyperlipidemia: Secondary | ICD-10-CM | POA: Diagnosis not present

## 2020-02-03 DIAGNOSIS — I10 Essential (primary) hypertension: Secondary | ICD-10-CM | POA: Diagnosis not present

## 2020-02-03 DIAGNOSIS — Z0000 Encounter for general adult medical examination without abnormal findings: Secondary | ICD-10-CM | POA: Diagnosis not present

## 2020-02-03 DIAGNOSIS — R29898 Other symptoms and signs involving the musculoskeletal system: Secondary | ICD-10-CM | POA: Diagnosis not present

## 2020-02-03 DIAGNOSIS — I2510 Atherosclerotic heart disease of native coronary artery without angina pectoris: Secondary | ICD-10-CM | POA: Diagnosis not present

## 2020-02-03 DIAGNOSIS — M47816 Spondylosis without myelopathy or radiculopathy, lumbar region: Secondary | ICD-10-CM | POA: Diagnosis not present

## 2020-02-03 DIAGNOSIS — R270 Ataxia, unspecified: Secondary | ICD-10-CM | POA: Diagnosis not present

## 2020-02-03 DIAGNOSIS — E119 Type 2 diabetes mellitus without complications: Secondary | ICD-10-CM | POA: Diagnosis not present

## 2020-02-04 DIAGNOSIS — H2513 Age-related nuclear cataract, bilateral: Secondary | ICD-10-CM | POA: Diagnosis not present

## 2020-02-04 DIAGNOSIS — H02155 Paralytic ectropion of left lower eyelid: Secondary | ICD-10-CM | POA: Diagnosis not present

## 2020-02-04 DIAGNOSIS — H10233 Serous conjunctivitis, except viral, bilateral: Secondary | ICD-10-CM | POA: Diagnosis not present

## 2020-02-09 DIAGNOSIS — E538 Deficiency of other specified B group vitamins: Secondary | ICD-10-CM | POA: Diagnosis not present

## 2020-02-16 DIAGNOSIS — E538 Deficiency of other specified B group vitamins: Secondary | ICD-10-CM | POA: Diagnosis not present

## 2020-02-22 ENCOUNTER — Ambulatory Visit: Payer: PPO | Attending: Internal Medicine

## 2020-02-22 ENCOUNTER — Other Ambulatory Visit: Payer: Self-pay

## 2020-02-22 DIAGNOSIS — R262 Difficulty in walking, not elsewhere classified: Secondary | ICD-10-CM | POA: Diagnosis not present

## 2020-02-22 DIAGNOSIS — M6281 Muscle weakness (generalized): Secondary | ICD-10-CM | POA: Diagnosis not present

## 2020-02-22 DIAGNOSIS — Z9181 History of falling: Secondary | ICD-10-CM | POA: Diagnosis not present

## 2020-02-22 NOTE — Patient Instructions (Signed)
Pt was recommended to use a lumbar towel roll when sitting. Pt verbalized understanding.

## 2020-02-22 NOTE — Therapy (Signed)
[facility] [address], [place], [address] Phone: [phone]   Fax:  [phone]  Physical Therapy Evaluation  Patient Details  Name: Brandon Clements MRN: [id number] Date of Birth: [birth year] Referring Provider (PT): Fulton Reek, MD   Encounter Date: 02/22/2020   PT End of Session - 02/22/20 0848    Visit Number 1    Number of Visits 17    Date for PT Re-Evaluation 04/21/20    Authorization Type 1    Authorization Time Period of 10 progress report    PT Start Time 0848    PT Stop Time 0950    PT Time Calculation (min) 62 min    Activity Tolerance Patient tolerated treatment well    Behavior During Therapy [facility] for tasks assessed/performed           Past Medical History:  Diagnosis Date   Arthritis    Right shoulder mostly and left shoulder   Barrett's esophagus    CAD (coronary artery disease) 2005   angioplasty with 3 stents   Cancer ([place])    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   Hx of radiation therapy 12/30/13-01/26/14   scalp 5000 cGy in 20 sessions   Hyperlipidemia    Hypertension    Kidney stones    Myocardial infarction [facility]) 2005   "mild"   Skin cancer 11/2013   right frontal scalp, squamous cell carcinoma, history basal cell ca   Status post dilation of esophageal narrowing     Past Surgical History:  Procedure Laterality Date   ANGIOPLASTY  2005   3 stents   BIOPSY  12/22/2019   Procedure: BIOPSY;  Surgeon: Irene Shipper, MD;  Location: [facility];  Service: Endoscopy;;   Cancer Sugery     Skin cancer   cardiac stent     x3   COLONOSCOPY  2010, 2006   hx adenomatous polyps 2006/ Dr. Henrene Pastor   COLONOSCOPY N/A 12/07/2014   Procedure: COLONOSCOPY;   Surgeon: Irene Shipper, MD;  Location: [facility];  Service: Endoscopy;  Laterality: N/A;   COLONOSCOPY WITH PROPOFOL N/A 12/22/2019   Procedure: COLONOSCOPY WITH PROPOFOL;  Surgeon: Irene Shipper, MD;  [facility]: [facility];  Service: Endoscopy;  Laterality: N/A;   ESOPHAGOGASTRODUODENOSCOPY N/A 12/07/2014   Procedure: ESOPHAGOGASTRODUODENOSCOPY (EGD);  Surgeon: Irene Shipper, MD;  Location: [facility];  Service: Endoscopy;  Laterality: N/A;   ESOPHAGOGASTRODUODENOSCOPY (EGD) WITH PROPOFOL N/A 12/22/2019   Procedure: ESOPHAGOGASTRODUODENOSCOPY (EGD) WITH PROPOFOL;  Surgeon: Irene Shipper, MD;  [facility]: [facility];  Service: Endoscopy;  Laterality: N/A;   EXTERNAL EAR SURGERY Left    left ear removal   NECK DISSECTION  Feb 2012   right   POLYPECTOMY  12/22/2019   Procedure: POLYPECTOMY;  Surgeon: Irene Shipper, MD;  [facility]: [facility];  Service: Endoscopy;;   RADICAL NECK DISSECTION  July 2012   left neck, jaw, and ear    There were no vitals filed for this visit.    Subjective Assessment - 02/22/20 0859    Subjective No pain. No B LE pareshtesia currently.    Pertinent History B LE weakness.Pt does not have an ear drum  on L side due to CA surgery. Has difficulty with balance. Uses a SPC on L hand for balance. Pt is L hand dominant. [name] helps with balance. Pt fell 3 times in the past 6 months. Pt usually loses balance when he steps on a lower surface on his L side. Could not get outside and do anything during the pandemic and pt just sat in and watched TV and fle like his legs got weaker because of it.  Does not have back pain but Legs feel like they get numb L > R. Feet get really ice cold. Feel also feel fat. Usually does that when he has high blood sugar. Feels numbness L lateral thigh and leg. Had x-rays which revealed deteriorating discs. Some days he can get up and walk fine. Other days, he feels like he does not have balance or can walk. Symptoms began about a year ago.  Currently having a difficult time with balance. Pt used to be able to ambulate about 3 miles on the treadmill before the pandemic. Pt states being able to walk about 1/4 of a mile then his legs get weaker. Pt currently sits around most of the day.    Patient Stated Goals Improve strength in LE.    Currently in Pain? No/denies    Pain Score 0-No pain    Pain Orientation Left;Right    Pain Onset More than a month ago    Pain Frequency Occasional    Aggravating Factors  B LE weakness, L LE numbness: increased blood sugar.              [facility] PT Assessment - 02/22/20 0855      Assessment   Medical Diagnosis B LE weakness    Referring Provider (PT) Fulton Reek, MD    Onset Date/Surgical Date 02/04/20   Date PT referral signed   Hand Dominance Left    Next MD Visit in 2 weeks    Prior Therapy None for this condition      Precautions   Precaution Comments Fall risk      Restrictions   Other Position/Activity Restrictions No known restrictions      Balance Screen   Has the patient fallen in the past 6 months Yes    How many times? 3    Has the patient had a decrease in activity level because of a fear of falling?  No    Is the patient reluctant to leave their home because of a fear of falling?  No      Prior Function   Vocation Requirements PLOF: pt able to ambulate 3 miles on the treadmill       Posture/Postural Control   Posture Comments Protracted neck. R cervical side bend,   B scapular protraction L > R, R iliac crest higher, L lateral shift, L trunk rotation,       AROM   Lumbar Flexion WFL with B LE shaking    Lumbar Extension WFL    Lumbar - Right Side Bend WFL    Lumbar - Left Side Bend WFL    Lumbar - Right Rotation WFL, feels off balance    Lumbar - Left Rotation WFL, B LE L > R shaking      PROM   Overall PROM Comments Supine hip at 90/90: IR R:26 degrees, L 21 degrees      Strength   Right Hip Flexion 4/5    Right Hip Extension 3+/5   seated manually  resisted  Right Hip ABduction 4/5    Left Hip Flexion 4/5    Left Hip Extension 3-/5   seated manually resisted   Left Hip ABduction 4/5    Right Knee Flexion 5/5    Right Knee Extension 5/5    Left Knee Flexion 4-/5    Left Knee Extension 5/5    Right Ankle Dorsiflexion 4-/5   seated manually resisted   Right Ankle Plantar Flexion 4/5   seated manually resisted   Left Ankle Dorsiflexion 4-/5   seated manually resisted   Left Ankle Plantar Flexion 4-/5   seated manually resisted     Ambulation/Gait   Gait Comments L pelvic drop during R LE stance phase. Slight decreased stance L LE                      Objective measurements completed on examination: See above findings.      No latex allergies Blood pressure controlled per pt.  hx of CA  Pt does not have an ear drum on L side due to CA surgery. Has difficulty with balance. Uses a SPC on L hand for balance. Pt is L hand dominant. [name] helps with balance. Pt fell 3 times in the past 6 months. Pt usually loses balance when he steps on a lower surface on his L side. Could not get outside and do anything during the pandemic and pt just sat in and watched TV and fle like his legs got weaker because of it.  Does not have back pain but Legs feel like they get numb L > R. Feet get really ice cold. Feel also feel fat. Usually does that when he has high blood sugar. Feels numbness L lateral thigh and leg. Had x-rays which revealed deteriorating discs. Some days he can get up and walk fine. Other days, he feels like he does not have balance or can walk. Symptoms began about a year ago. Currently having a difficult time with balance. Pt used to be able to ambulate about 3 miles on the treadmill before the pandemic. Pt states being able to walk about 1/4 of a mile then his legs get weaker. Pt currently sits around most of the day. Pt also had muscle removed from his R quadriceps for his L face surgery.    TUG without SPC 16 seconds, 13  seconds, 11 seconds. Pt uses hands.   5 times sit to stand 18 seconds  (-) Long sit test   LTG sit <> stand withou tUE assist from regualr chair.   sitting with lumbar towel roll.    Increased L LE shakiness with trunk flexion, and L trunk rotation     FOTO next visit.      Patient is a 79 year old male who came to physical therapy secondary to bilateral LE weakness. He also presents with poor posture, altered gait pattern, decreased balance, B hip weakness, B LE paresthesias, and difficulty performing standing tasks as well as ambulating longer distances secondary to weakness. Pt will benefit from skilled physical therapy services to address the aforementioned deficits.       PT Education - 02/22/20 1059    Education Details plan of care    Person(s) Educated Patient    Methods Explanation    Comprehension Verbalized understanding            PT Short Term Goals - 02/22/20 1106      PT SHORT TERM GOAL #1   Title Patient  will be independent with his initial home exercise program to improve B LE strength, to improve function, gait distance, and balance.    Time 3    Period Weeks    Status New    Target Date 03/17/20             PT Long Term Goals - 02/22/20 1109      PT LONG TERM GOAL #1   Title Patient will improve B LE strength by at least 1/2 MMT grade to improve function, balance, and decrease difficulty ambulating longer distances and performing transfers.    Time 8    Period Weeks    Status New    Target Date 04/21/20      PT LONG TERM GOAL #2   Title Pt will be able to stand up from a chair without UE assist as a demonstration of improved LE strength.    Baseline Pt stands up from a chair with arms with need of B UE assist (02/22/2020)    Time 8    Period Weeks    Status New    Target Date 04/21/20      PT LONG TERM GOAL #3   Title Pt will improve his FOTO score by at least 10 points as a demonstration of improved function.    Time 8    Period  Weeks    Status New    Target Date 04/21/20      PT LONG TERM GOAL #4   Title Pt will improve his TUG time to 12 seconds or less as a demonstration of improved functional mobility and balance.    Baseline 13.3 seconds average (02/22/2020)    Time 8    Period Weeks    Status New    Target Date 04/21/20      PT LONG TERM GOAL #5   Title Pt will improve his five times sit to stand time to 12 seconds or less as a demonstration of improved LE functional strength.    Baseline 18 seconds (02/22/2020    Time 8    Period Weeks    Status New    Target Date 04/21/20                  Plan - 02/22/20 1102    Clinical Impression Statement Patient is a 79 year old male who came to physical therapy secondary to bilateral LE weakness. He also presents with poor posture, altered gait pattern, decreased balance, B hip weakness, B LE paresthesias, and difficulty performing standing tasks as well as ambulating longer distances secondary to weakness. Pt will benefit from skilled physical therapy services to address the aforementioned deficits.    Personal Factors and Comorbidities Age;Comorbidity 3+;Fitness;Past/Current Experience;Time since onset of injury/illness/exacerbation    Comorbidities CAD, hx of skin CA, L ear removal, hx of MI, HTN    Examination-Activity Limitations Squat;Stairs;Stand;Carry;Transfers;Bathing    Stability/Clinical Decision Making Evolving/Moderate complexity   worsening LE strength based on subjective reports   Clinical Decision Making Moderate    Rehab Potential Fair    PT Frequency 2x / week    PT Duration 8 weeks    PT Treatment/Interventions Therapeutic activities;Therapeutic exercise;Balance training;Neuromuscular re-education;Patient/family education;Manual techniques;Dry needling;Vestibular;Aquatic Therapy;Traction;Electrical Stimulation;Iontophoresis 4mg /ml Dexamethasone    PT Next Visit Plan posture, trunk, scapular, hip strengthening, balance, manual techniques,  modalities PRN    Consulted and Agree with Plan of Care Patient           Patient will benefit from skilled  therapeutic intervention in order to improve the following deficits and impairments:  Improper body mechanics, Postural dysfunction, Difficulty walking, Decreased strength, Dizziness, Decreased balance  Visit Diagnosis: Muscle weakness (generalized) - Plan: PT plan of care cert/re-cert  History of falling - Plan: PT plan of care cert/re-cert  Difficulty in walking, not elsewhere classified - Plan: PT plan of care cert/re-cert     Problem List Patient Active Problem List   Diagnosis Date Noted   Benign neoplasm of ascending colon    History of colon polyps    Benign neoplasm of transverse colon    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, [place], [address] Phone: [phone]   Fax:  [phone]  Name: Brandon Clements MRN: [id number] Date of Birth: 11/30/1940

## 2020-02-23 DIAGNOSIS — E538 Deficiency of other specified B group vitamins: Secondary | ICD-10-CM | POA: Diagnosis not present

## 2020-02-29 ENCOUNTER — Ambulatory Visit: Payer: PPO

## 2020-02-29 ENCOUNTER — Other Ambulatory Visit: Payer: Self-pay

## 2020-02-29 DIAGNOSIS — M6281 Muscle weakness (generalized): Secondary | ICD-10-CM | POA: Diagnosis not present

## 2020-02-29 DIAGNOSIS — Z9181 History of falling: Secondary | ICD-10-CM

## 2020-02-29 DIAGNOSIS — R262 Difficulty in walking, not elsewhere classified: Secondary | ICD-10-CM

## 2020-02-29 NOTE — Therapy (Signed)
[facility] [address], [place], [address] Phone: [phone]   Fax:  [phone]  Physical Therapy Treatment  Patient Details  Name: Brandon Clements MRN: [id number] Date of Birth: [birth year] Referring Provider (PT): Fulton Reek, MD   Encounter Date: 02/29/2020   PT End of Session - 02/29/20 0852    Visit Number 2    Number of Visits 17    Date for PT Re-Evaluation 04/21/20    Authorization Type 2    Authorization Time Period of 10 progress report    PT Start Time 0852    PT Stop Time 0932    PT Time Calculation (min) 40 min    Activity Tolerance Patient tolerated treatment well    Behavior During Therapy [facility] for tasks assessed/performed           Past Medical History:  Diagnosis Date  . Arthritis    Right shoulder mostly and left shoulder  . Barrett's esophagus   . CAD (coronary artery disease) 2005   angioplasty with 3 stents  . 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 Colon polyp    adenomatous  . GERD (gastroesophageal reflux disease)   . Hiatal hernia   . Hx of radiation therapy 2011   left cheek, [place] [facility]  . Hx of radiation therapy 12/30/13-01/26/14   scalp 5000 cGy in 20 sessions  . Hyperlipidemia   . Hypertension   . Kidney stones   . Myocardial infarction [facility]) 2005   "mild"  . Skin cancer 11/2013   right frontal scalp, squamous cell carcinoma, history basal cell ca  . Status post dilation of esophageal narrowing     Past Surgical History:  Procedure Laterality Date  . ANGIOPLASTY  2005   3 stents  . BIOPSY  12/22/2019   Procedure: BIOPSY;  Surgeon: Irene Shipper, MD;  Location: [facility];  Service: Endoscopy;;  . Cancer Sugery     Skin cancer  . cardiac stent     x3  . COLONOSCOPY  2010, 2006   hx adenomatous polyps 2006/ Dr. Henrene Pastor  . COLONOSCOPY N/A 12/07/2014   Procedure: COLONOSCOPY;   Surgeon: Irene Shipper, MD;  Location: [facility];  Service: Endoscopy;  Laterality: N/A;  . COLONOSCOPY WITH PROPOFOL N/A 12/22/2019   Procedure: COLONOSCOPY WITH PROPOFOL;  Surgeon: Irene Shipper, MD;  Location: [facility];  Service: Endoscopy;  Laterality: N/A;  . ESOPHAGOGASTRODUODENOSCOPY N/A 12/07/2014   Procedure: ESOPHAGOGASTRODUODENOSCOPY (EGD);  Surgeon: Irene Shipper, MD;  Location: [facility];  Service: Endoscopy;  Laterality: N/A;  . ESOPHAGOGASTRODUODENOSCOPY (EGD) WITH PROPOFOL N/A 12/22/2019   Procedure: ESOPHAGOGASTRODUODENOSCOPY (EGD) WITH PROPOFOL;  Surgeon: Irene Shipper, MD;  Location: [facility];  Service: Endoscopy;  Laterality: N/A;  . EXTERNAL EAR SURGERY Left    left ear removal  . NECK DISSECTION  Feb 2012   right  . POLYPECTOMY  12/22/2019   Procedure: POLYPECTOMY;  Surgeon: Irene Shipper, MD;  Location: [facility];  Service: Endoscopy;;  . RADICAL NECK DISSECTION  July 2012   left neck, jaw, and ear    There were no vitals filed for this visit.   Subjective Assessment - 02/29/20 0853    Subjective Has a busy day. No pain currently.    Pertinent History B LE weakness.Pt does not have an ear drum on  L side due to CA surgery. Has difficulty with balance. Uses a SPC on L hand for balance. Pt is L hand dominant. [name] helps with balance. Pt fell 3 times in the past 6 months. Pt usually loses balance when he steps on a lower surface on his L side. Could not get outside and do anything during the pandemic and pt just sat in and watched TV and fle like his legs got weaker because of it.  Does not have back pain but Legs feel like they get numb L > R. Feet get really ice cold. Feel also feel fat. Usually does that when he has high blood sugar. Feels numbness L lateral thigh and leg. Had x-rays which revealed deteriorating discs. Some days he can get up and walk fine. Other days, he feels like he does not have balance or can walk. Symptoms began about a year ago. Currently  having a difficult time with balance. Pt used to be able to ambulate about 3 miles on the treadmill before the pandemic. Pt states being able to walk about 1/4 of a mile then his legs get weaker. Pt currently sits around most of the day.    Patient Stated Goals Improve strength in LE.    Currently in Pain? No/denies    Pain Onset More than a month ago                                     PT Education - 02/29/20 0939    Education Details ther-ex, HEP    Person(s) Educated Patient    Methods Explanation;Demonstration;Tactile cues;Verbal cues;Handout    Comprehension Returned demonstration;Verbalized understanding            Objective    No latex allergies Blood pressure controlled per pt.  hx of CA  5 times sit to stand 18 seconds  (-) Long sit test   LTG sit <> stand withou tUE assist from regualr chair.   sitting with lumbar towel roll.    Increased L LE shakiness with trunk flexion, and L trunk rotation     FOTO next visit.    Med[employer] Access Code [email or web address]  Therapeutic exercise  Standing back extension 10x3 with 5 seconds   Standing R lateral shift correction 10x5 seconds for 3 sets  Improved posture observed afterwards   standing with B UE assist   SLS   R 10x5 seconds for 2 sets   L 10x5 seconds for 2 sets   Hip abduction    R 10x3   L 10x3   Seated manually resisted L lateral shift isometrics to counter R lumbar lateral shift posture   10x5 seconds for 3 sets  Reviewed HEP. Pt demonstrated and verbalized understanding. Handout provided.     Improved exercise technique, movement at target joints, use of target muscles after mod verbal, visual, tactile cues.    Response to treatment Pt tolerated session well without aggravation of symptoms.   Clinical impression Worked on improving posture as well as gentle lumbar extension secondary to extension preference pertaining to symptom of shakiness during  lumbar flexion. Also worked on [employer] as well to help decrease weakness as well as decrease stress to low back during standing exercises. Pt tolerated session well without aggravation of symptoms. Pt will benefit from continued skilled physical therapy services to improve strength, balance, and function.        PT Short  Term Goals - 02/22/20 1106      PT SHORT TERM GOAL #1   Title Patient will be independent with his initial home exercise program to improve B LE strength, to improve function, gait distance, and balance.    Time 3    Period Weeks    Status New    Target Date 03/17/20             PT Long Term Goals - 02/22/20 1109      PT LONG TERM GOAL #1   Title Patient will improve B LE strength by at least 1/2 MMT grade to improve function, balance, and decrease difficulty ambulating longer distances and performing transfers.    Time 8    Period Weeks    Status New    Target Date 04/21/20      PT LONG TERM GOAL #2   Title Pt will be able to stand up from a chair without UE assist as a demonstration of improved LE strength.    Baseline Pt stands up from a chair with arms with need of B UE assist (02/22/2020)    Time 8    Period Weeks    Status New    Target Date 04/21/20      PT LONG TERM GOAL #3   Title Pt will improve his FOTO score by at least 10 points as a demonstration of improved function.    Time 8    Period Weeks    Status New    Target Date 04/21/20      PT LONG TERM GOAL #4   Title Pt will improve his TUG time to 12 seconds or less as a demonstration of improved functional mobility and balance.    Baseline 13.3 seconds average (02/22/2020)    Time 8    Period Weeks    Status New    Target Date 04/21/20      PT LONG TERM GOAL #5   Title Pt will improve his five times sit to stand time to 12 seconds or less as a demonstration of improved LE functional strength.    Baseline 18 seconds (02/22/2020    Time 8    Period Weeks    Status New     Target Date 04/21/20                 Plan - 02/29/20 0931    Clinical Impression Statement Worked on improving posture as well as gentle lumbar extension secondary to extension preference pertaining to symptom of shakiness during lumbar flexion. Also worked on [employer] as well to help decrease weakness as well as decrease stress to low back during standing exercises. Pt tolerated session well without aggravation of symptoms. Pt will benefit from continued skilled physical therapy services to improve strength, balance, and function.    Personal Factors and Comorbidities Age;Comorbidity 3+;Fitness;Past/Current Experience;Time since onset of injury/illness/exacerbation    Comorbidities CAD, hx of skin CA, L ear removal, hx of MI, HTN    Examination-Activity Limitations Squat;Stairs;Stand;Carry;Transfers;Bathing    Stability/Clinical Decision Making Evolving/Moderate complexity   worsening LE strength based on subjective reports   Rehab Potential Fair    PT Frequency 2x / week    PT Duration 8 weeks    PT Treatment/Interventions Therapeutic activities;Therapeutic exercise;Balance training;Neuromuscular re-education;Patient/family education;Manual techniques;Dry needling;Vestibular;Aquatic Therapy;Traction;Electrical Stimulation;Iontophoresis 4mg /ml Dexamethasone    PT Next Visit Plan posture, trunk, scapular, hip strengthening, balance, manual techniques, modalities PRN    Consulted and Agree with Plan of Care  Patient           Patient will benefit from skilled therapeutic intervention in order to improve the following deficits and impairments:  Improper body mechanics, Postural dysfunction, Difficulty walking, Decreased strength, Dizziness, Decreased balance  Visit Diagnosis: Muscle weakness (generalized)  History of falling  Difficulty in walking, not elsewhere classified     Problem List Patient Active Problem List   Diagnosis Date Noted  . Benign neoplasm of  ascending colon   . History of colon polyps   . Benign neoplasm of transverse colon   . 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 CORONARY ARTERY DISEASE 04/02/2008  . GERD 04/02/2008  . PERSONAL HX COLONIC POLYPS 04/02/2008  . ESOPHAGITIS 04/01/2008  . BARRETTS ESOPHAGUS 04/01/2008  . HIATAL HERNIA 04/01/2008  . SKIN CANCER, HX OF 04/01/2008  . NEPHROLITHIASIS, HX OF 04/01/2008    Joneen Boers PT, DPT   02/29/2020, 9:41 AM  [facility] [address] [place], [address] Phone: [phone]   Fax:  [phone]  Name: Brandon Clements MRN: [id number] Date of Birth: Oct 04, 1940

## 2020-02-29 NOTE — Patient Instructions (Signed)
Access Code: [email or web address] URL: [email or web address] Date: 02/29/2020 Prepared by: Joneen Boers  Exercises Standing Single Leg Stance with Unilateral Counter Support - 1 x daily - 7 x weekly - 3 sets - 10 reps - 5 seconds hold Standing Hip Abduction with Resistance at Ankles and Counter Support - 1 x daily - 7 x weekly - 3 sets - 10 reps

## 2020-03-01 DIAGNOSIS — E538 Deficiency of other specified B group vitamins: Secondary | ICD-10-CM | POA: Diagnosis not present

## 2020-03-02 ENCOUNTER — Ambulatory Visit: Payer: PPO

## 2020-03-03 ENCOUNTER — Other Ambulatory Visit: Payer: Self-pay

## 2020-03-03 ENCOUNTER — Ambulatory Visit: Payer: PPO

## 2020-03-03 DIAGNOSIS — Z9181 History of falling: Secondary | ICD-10-CM

## 2020-03-03 DIAGNOSIS — R262 Difficulty in walking, not elsewhere classified: Secondary | ICD-10-CM

## 2020-03-03 DIAGNOSIS — M6281 Muscle weakness (generalized): Secondary | ICD-10-CM

## 2020-03-03 NOTE — Therapy (Signed)
[facility] [address], [place], [address] Phone: [phone]   Fax:  [phone]  Physical Therapy Treatment  Patient Details  Name: Brandon Clements MRN: [id number] Date of Birth: [birth year] Referring Provider (PT): Fulton Reek, MD   Encounter Date: 03/03/2020   PT End of Session - 03/03/20 1415    Visit Number 3    Number of Visits 17    Date for PT Re-Evaluation 04/21/20    Authorization Type 3    Authorization Time Period of 10 progress report    PT Start Time 1415    PT Stop Time 5361    PT Time Calculation (min) 41 min    Activity Tolerance Patient tolerated treatment well    Behavior During Therapy [facility] for tasks assessed/performed           Past Medical History:  Diagnosis Date   Arthritis    Right shoulder mostly and left shoulder   Barrett's esophagus    CAD (coronary artery disease) 2005   angioplasty with 3 stents   Cancer ([place])    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   10/2010, squamous cell cancer  right jaw 2012     Colon polyp    adenomatous   GERD (gastroesophageal reflux disease)    Hiatal hernia    Hx of radiation therapy 2011   left cheek, [facility]   Hx of radiation therapy 12/30/13-01/26/14   scalp 5000 cGy in 20 sessions   Hyperlipidemia    Hypertension    Kidney stones    Myocardial infarction [facility]) 2005   "mild"   Skin cancer 11/2013   right frontal scalp, squamous cell carcinoma, history basal cell ca   Status post dilation of esophageal narrowing     Past Surgical History:  Procedure Laterality Date   ANGIOPLASTY  2005   3 stents   BIOPSY  12/22/2019   Procedure: BIOPSY;  Surgeon: Irene Shipper, MD;  Location: [facility];  Service: Endoscopy;;   Cancer Sugery     Skin cancer   cardiac stent     x3   COLONOSCOPY  2010, 2006   hx adenomatous polyps 2006/ Dr. Henrene Pastor   COLONOSCOPY N/A 12/07/2014   Procedure: COLONOSCOPY;   Surgeon: Irene Shipper, MD;  [facility]: [facility];  Service: Endoscopy;  Laterality: N/A;   COLONOSCOPY WITH PROPOFOL N/A 12/22/2019   Procedure: COLONOSCOPY WITH PROPOFOL;  Surgeon: Irene Shipper, MD;  [facility];  Service: Endoscopy;  Laterality: N/A;   ESOPHAGOGASTRODUODENOSCOPY N/A 12/07/2014   Procedure: ESOPHAGOGASTRODUODENOSCOPY (EGD);  Surgeon: Irene Shipper, MD;  Location: [facility];  Service: Endoscopy;  Laterality: N/A;   ESOPHAGOGASTRODUODENOSCOPY (EGD) WITH PROPOFOL N/A 12/22/2019   Procedure: ESOPHAGOGASTRODUODENOSCOPY (EGD) WITH PROPOFOL;  Surgeon: Irene Shipper, MD;  [facility];  Service: Endoscopy;  Laterality: N/A;   EXTERNAL EAR SURGERY Left    left ear removal   NECK DISSECTION  Feb 2012   right   POLYPECTOMY  12/22/2019   Procedure: POLYPECTOMY;  Surgeon: Irene Shipper, MD;  [facility];  Service: Endoscopy;;   RADICAL NECK DISSECTION  July 2012   left neck, jaw, and ear    There were no vitals filed for this visit.   Subjective Assessment - 03/03/20 1417    Subjective Feet did not get cold after last session. Just have a little tinge in the bottom of his back but not pain.  Pertinent History B LE weakness.Pt does not have an ear drum on L side due to CA surgery. Has difficulty with balance. Uses a SPC on L hand for balance. Pt is L hand dominant. [name] helps with balance. Pt fell 3 times in the past 6 months. Pt usually loses balance when he steps on a lower surface on his L side. Could not get outside and do anything during the pandemic and pt just sat in and watched TV and fle like his legs got weaker because of it.  Does not have back pain but Legs feel like they get numb L > R. Feet get really ice cold. Feel also feel fat. Usually does that when he has high blood sugar. Feels numbness L lateral thigh and leg. Had x-rays which revealed deteriorating discs. Some days he can get up and walk fine. Other days, he feels like he does  not have balance or can walk. Symptoms began about a year ago. Currently having a difficult time with balance. Pt used to be able to ambulate about 3 miles on the treadmill before the pandemic. Pt states being able to walk about 1/4 of a mile then his legs get weaker. Pt currently sits around most of the day.    Patient Stated Goals Improve strength in LE.    Currently in Pain? No/denies    Pain Onset More than a month ago                                     PT Education - 03/03/20 1447    Education Details ther-ex    [employer]) Educated Patient    Methods Explanation;Demonstration;Tactile cues;Verbal cues    Comprehension Returned demonstration;Verbalized understanding            Objective    No latex allergies  Blood pressure controlled per pt.  hx of CA  5 times sit to stand 18 seconds  (-) Long sit test  LTG sit <>stand withou tUE assist from regualr chair.   sitting with lumbar towel roll.  Increased L LE shakiness with trunk flexion, and L trunk rotation     FOTO next visit.   [employer] Access Code [email or web address]  Therapeutic exercise   Seated manually resisted L lateral shift isometrics to counter R lumbar lateral shift posture              10x5 seconds for 3 sets  Standing R lateral shift correction 10x5 seconds for 3 sets  Standing back extension 10x3 with 5 seconds               Improved posture observed afterwards   standing with B to one UE assist              SLS                         R 10x5 seconds for 2 sets                         L 10x5 seconds for 2 sets  Standing B shoulder extension with scapular retraction red band   10x5 seconds for 3 sets              Hip abduction yellow band  R 10x3                         L 10x3  Standing back extension 10x2  Sitting with upright posture: manually resisted trunk flexion isometrics 10x3 with 5 seconds     Improved  exercise technique, movement at target joints, use of target muscles after mod verbal, visual, tactile cues.    Response to treatment No low back tinge after session.   Clinical impression Improving LE symptoms based on subjective reports. Continued working on improving posture, trunk and glute strength to promote better mechanics to low back and decrease stress. No back discomfort at end of session. Pt will benefit from continued skilled physical therapy services to improve strength, balance, and function.       PT Short Term Goals - 02/22/20 1106      PT SHORT TERM GOAL #1   Title Patient will be independent with his initial home exercise program to improve B LE strength, to improve function, gait distance, and balance.    Time 3    Period Weeks    Status New    Target Date 03/17/20             PT Long Term Goals - 02/22/20 1109      PT LONG TERM GOAL #1   Title Patient will improve B LE strength by at least 1/2 MMT grade to improve function, balance, and decrease difficulty ambulating longer distances and performing transfers.    Time 8    Period Weeks    Status New    Target Date 04/21/20      PT LONG TERM GOAL #2   Title Pt will be able to stand up from a chair without UE assist as a demonstration of improved LE strength.    Baseline Pt stands up from a chair with arms with need of B UE assist (02/22/2020)    Time 8    Period Weeks    Status New    Target Date 04/21/20      PT LONG TERM GOAL #3   Title Pt will improve his FOTO score by at least 10 points as a demonstration of improved function.    Time 8    Period Weeks    Status New    Target Date 04/21/20      PT LONG TERM GOAL #4   Title Pt will improve his TUG time to 12 seconds or less as a demonstration of improved functional mobility and balance.    Baseline 13.3 seconds average (02/22/2020)    Time 8    Period Weeks    Status New    Target Date 04/21/20      PT LONG TERM GOAL #5   Title Pt will  improve his five times sit to stand time to 12 seconds or less as a demonstration of improved LE functional strength.    Baseline 18 seconds (02/22/2020    Time 8    Period Weeks    Status New    Target Date 04/21/20                 Plan - 03/03/20 1503    Clinical Impression Statement Improving LE symptoms based on subjective reports. Continued working on improving posture, trunk and glute strength to promote better mechanics to low back and decrease stress. No back discomfort at end of session. Pt will benefit from continued skilled physical therapy services to  improve strength, balance, and function.    Personal Factors and Comorbidities Age;Comorbidity 3+;Fitness;Past/Current Experience;Time since onset of injury/illness/exacerbation    Comorbidities CAD, hx of skin CA, L ear removal, hx of MI, HTN    Examination-Activity Limitations Squat;Stairs;Stand;Carry;Transfers;Bathing    Stability/Clinical Decision Making Evolving/Moderate complexity   worsening LE strength based on subjective reports   Rehab Potential Fair    PT Frequency 2x / week    PT Duration 8 weeks    PT Treatment/Interventions Therapeutic activities;Therapeutic exercise;Balance training;Neuromuscular re-education;Patient/family education;Manual techniques;Dry needling;Vestibular;Aquatic Therapy;Traction;Electrical Stimulation;Iontophoresis 4mg /ml Dexamethasone    PT Next Visit Plan posture, trunk, scapular, hip strengthening, balance, manual techniques, modalities PRN    Consulted and Agree with Plan of Care Patient           Patient will benefit from skilled therapeutic intervention in order to improve the following deficits and impairments:  Improper body mechanics, Postural dysfunction, Difficulty walking, Decreased strength, Dizziness, Decreased balance  Visit Diagnosis: Muscle weakness (generalized)  History of falling  Difficulty in walking, not elsewhere classified     Problem List Patient  Active Problem List   Diagnosis Date Noted   Benign neoplasm of ascending colon    History of colon polyps    Benign neoplasm of transverse colon    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, [place], [address] Phone: [phone]   Fax:  [phone]  Name: TEE RICHESON MRN: [id number] Date of Birth: [birth year]

## 2020-03-04 DIAGNOSIS — L814 Other melanin hyperpigmentation: Secondary | ICD-10-CM | POA: Diagnosis not present

## 2020-03-04 DIAGNOSIS — D225 Melanocytic nevi of trunk: Secondary | ICD-10-CM | POA: Diagnosis not present

## 2020-03-04 DIAGNOSIS — Z85828 Personal history of other malignant neoplasm of skin: Secondary | ICD-10-CM | POA: Diagnosis not present

## 2020-03-04 DIAGNOSIS — C4442 Squamous cell carcinoma of skin of scalp and neck: Secondary | ICD-10-CM | POA: Diagnosis not present

## 2020-03-04 DIAGNOSIS — L821 Other seborrheic keratosis: Secondary | ICD-10-CM | POA: Diagnosis not present

## 2020-03-04 DIAGNOSIS — D485 Neoplasm of uncertain behavior of skin: Secondary | ICD-10-CM | POA: Diagnosis not present

## 2020-03-04 DIAGNOSIS — L570 Actinic keratosis: Secondary | ICD-10-CM | POA: Diagnosis not present

## 2020-03-07 ENCOUNTER — Ambulatory Visit: Payer: PPO

## 2020-03-07 DIAGNOSIS — H10233 Serous conjunctivitis, except viral, bilateral: Secondary | ICD-10-CM | POA: Diagnosis not present

## 2020-03-07 DIAGNOSIS — H02155 Paralytic ectropion of left lower eyelid: Secondary | ICD-10-CM | POA: Diagnosis not present

## 2020-03-07 DIAGNOSIS — H2513 Age-related nuclear cataract, bilateral: Secondary | ICD-10-CM | POA: Diagnosis not present

## 2020-03-09 ENCOUNTER — Ambulatory Visit: Payer: PPO

## 2020-03-09 ENCOUNTER — Other Ambulatory Visit: Payer: Self-pay

## 2020-03-09 DIAGNOSIS — R262 Difficulty in walking, not elsewhere classified: Secondary | ICD-10-CM

## 2020-03-09 DIAGNOSIS — Z9181 History of falling: Secondary | ICD-10-CM

## 2020-03-09 DIAGNOSIS — M6281 Muscle weakness (generalized): Secondary | ICD-10-CM

## 2020-03-09 NOTE — Therapy (Signed)
[facility] [address], [place], [address] Phone: [phone]   Fax:  [phone]  Physical Therapy Treatment  Patient Details  Name: Brandon Clements MRN: [id number] Date of Birth: [birth year] Referring Provider (PT): Fulton Reek, MD   Encounter Date: 03/09/2020   PT End of Session - 03/09/20 1303    Visit Number 4    Number of Visits 17    Date for PT Re-Evaluation 04/21/20    Authorization Type 4    Authorization Time Period of 10 progress report    PT Start Time 1303    PT Stop Time 1344    PT Time Calculation (min) 41 min    Activity Tolerance Patient tolerated treatment well    Behavior During Therapy [facility] for tasks assessed/performed           Past Medical History:  Diagnosis Date   Arthritis    Right shoulder mostly and left shoulder   Barrett's esophagus    CAD (coronary artery disease) 2005   angioplasty with 3 stents   Cancer ([place])    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   10/2010, squamous cell cancer  right jaw 2012     Colon polyp    adenomatous   GERD (gastroesophageal reflux disease)    Hiatal hernia    Hx of radiation therapy 2011   left cheek, [facility]   Hx of radiation therapy 12/30/13-01/26/14   scalp 5000 cGy in 20 sessions   Hyperlipidemia    Hypertension    Kidney stones    Myocardial infarction [facility]) 2005   "mild"   Skin cancer 11/2013   right frontal scalp, squamous cell carcinoma, history basal cell ca   Status post dilation of esophageal narrowing     Past Surgical History:  Procedure Laterality Date   ANGIOPLASTY  2005   3 stents   BIOPSY  12/22/2019   Procedure: BIOPSY;  Surgeon: Irene Shipper, MD;  [facility];  Service: Endoscopy;;   Cancer Sugery     Skin cancer   cardiac stent     x3   COLONOSCOPY  2010, 2006   hx adenomatous polyps 2006/ Dr. Henrene Pastor   COLONOSCOPY N/A 12/07/2014   Procedure: COLONOSCOPY;   Surgeon: Irene Shipper, MD;  [facility];  Service: Endoscopy;  Laterality: N/A;   COLONOSCOPY WITH PROPOFOL N/A 12/22/2019   Procedure: COLONOSCOPY WITH PROPOFOL;  Surgeon: Irene Shipper, MD;  [facility];  Service: Endoscopy;  Laterality: N/A;   ESOPHAGOGASTRODUODENOSCOPY N/A 12/07/2014   Procedure: ESOPHAGOGASTRODUODENOSCOPY (EGD);  Surgeon: Irene Shipper, MD;  [facility];  Service: Endoscopy;  Laterality: N/A;   ESOPHAGOGASTRODUODENOSCOPY (EGD) WITH PROPOFOL N/A 12/22/2019   Procedure: ESOPHAGOGASTRODUODENOSCOPY (EGD) WITH PROPOFOL;  Surgeon: Irene Shipper, MD;  [facility];  Service: Endoscopy;  Laterality: N/A;   EXTERNAL EAR SURGERY Left    left ear removal   NECK DISSECTION  Feb 2012   right   POLYPECTOMY  12/22/2019   Procedure: POLYPECTOMY;  Surgeon: Irene Shipper, MD;  [facility];  Service: Endoscopy;;   RADICAL NECK DISSECTION  July 2012   left neck, jaw, and ear    There were no vitals filed for this visit.   Subjective Assessment - 03/09/20 1304    Subjective Had L eye infection, went to the doctor. Pt also fell yesterday injurying his L hip. Pt stood up from the reclinder,  turned to the L and the shoes he had on gripped the hardwood floor too much and pt fell. Hit the back of his head. Does not feel like he broke any bones. Feels like he twisted his L hip. Had to take his wife to see her brother at the hospital. Wife is currently sitting in the car waiting outside.  L hip is sore on the bottom.    Pertinent History B LE weakness.Pt does not have an ear drum on L side due to CA surgery. Has difficulty with balance. Uses a SPC on L hand for balance. Pt is L hand dominant. [name] helps with balance. Pt fell 3 times in the past 6 months. Pt usually loses balance when he steps on a lower surface on his L side. Could not get outside and do anything during the pandemic and pt just sat in and watched TV and fle like his legs got  weaker because of it.  Does not have back pain but Legs feel like they get numb L > R. Feet get really ice cold. Feel also feel fat. Usually does that when he has high blood sugar. Feels numbness L lateral thigh and leg. Had x-rays which revealed deteriorating discs. Some days he can get up and walk fine. Other days, he feels like he does not have balance or can walk. Symptoms began about a year ago. Currently having a difficult time with balance. Pt used to be able to ambulate about 3 miles on the treadmill before the pandemic. Pt states being able to walk about 1/4 of a mile then his legs get weaker. Pt currently sits around most of the day.    Patient Stated Goals Improve strength in LE.    Currently in Pain? Yes    Pain Score 5     Pain Location Hip    Pain Orientation Left;Posterior;Proximal    Pain Descriptors / Indicators Sore    Pain Onset More than a month ago                                     PT Education - 03/09/20 1313    Education Details ther-ex    Person(s) Educated Patient    Methods Explanation;Demonstration;Tactile cues;Verbal cues    Comprehension Returned demonstration;Verbalized understanding          Objective    No latex allergies  Blood pressure controlled per pt.  hx of CA  5 times sit to stand 18 seconds  (-) Long sit test  LTG sit <>stand withou tUE assist from regualr chair.   sitting with lumbar towel roll.  Increased L LE shakiness with trunk flexion, and L trunk rotation     FOTO next visit.   [employer] Code [email or web address]  Therapeutic exercise  Seated hip extension isometrics   L 10x3 with 5 second holds   Seated clamshell isometrics 10x3 with 5 seconds, hips less than 90 degrees flexion   Seated hip adduction ball and glute max squeeze 10x3 with 5 second holds   Seated hip ER isometrics 10x3 with 5 second holds  Seated hip IR isometrics 10x3 with 5 second holds   Seated knee  flexion isometrics 10x3 with 5 second holds   Seated knee flexion isometrics 10x3 with 5 second holds   Decreased L hip soreness after aforementioned exercises.    Seated manually resisted L lateral shift isometrics to counter R lumbar  lateral shift posture  10x5 seconds for 3 sets   Improved exercise technique, movement at target joints, use of target muscles after mod verbal, visual, tactile cues.   Response to treatment No L hip soreness, even during gait after session.   Clinical impression Worked on isometric muscle contraction on of muscles around L hip to promote gentle strengthening and help decrease pain/promote analgesic effects of muscle contraction. No L hip soreness in sitting and ambulating after session. Pt will benefit from continued skilled physical therapy services to decrease pain, improve strength, balance and function.        PT Short Term Goals - 02/22/20 1106      PT SHORT TERM GOAL #1   Title Patient will be independent with his initial home exercise program to improve B LE strength, to improve function, gait distance, and balance.    Time 3    Period Weeks    Status New    Target Date 03/17/20             PT Long Term Goals - 02/22/20 1109      PT LONG TERM GOAL #1   Title Patient will improve B LE strength by at least 1/2 MMT grade to improve function, balance, and decrease difficulty ambulating longer distances and performing transfers.    Time 8    Period Weeks    Status New    Target Date 04/21/20      PT LONG TERM GOAL #2   Title Pt will be able to stand up from a chair without UE assist as a demonstration of improved LE strength.    Baseline Pt stands up from a chair with arms with need of B UE assist (02/22/2020)    Time 8    Period Weeks    Status New    Target Date 04/21/20      PT LONG TERM GOAL #3   Title Pt will improve his FOTO score by at least 10 points as a demonstration of improved function.    Time 8     Period Weeks    Status New    Target Date 04/21/20      PT LONG TERM GOAL #4   Title Pt will improve his TUG time to 12 seconds or less as a demonstration of improved functional mobility and balance.    Baseline 13.3 seconds average (02/22/2020)    Time 8    Period Weeks    Status New    Target Date 04/21/20      PT LONG TERM GOAL #5   Title Pt will improve his five times sit to stand time to 12 seconds or less as a demonstration of improved LE functional strength.    Baseline 18 seconds (02/22/2020    Time 8    Period Weeks    Status New    Target Date 04/21/20                 Plan - 03/09/20 1313    Clinical Impression Statement Worked on isometric muscle contraction on of muscles around L hip to promote gentle strengthening and help decrease pain/promote analgesic effects of muscle contraction. No L hip soreness in sitting and ambulating after session. Pt will benefit from continued skilled physical therapy services to decrease pain, improve strength, balance and function.    Personal Factors and Comorbidities Age;Comorbidity 3+;Fitness;Past/Current Experience;Time since onset of injury/illness/exacerbation    Comorbidities CAD, hx of skin CA, L ear removal, hx  of MI, HTN    Examination-Activity Limitations Squat;Stairs;Stand;Carry;Transfers;Bathing    Stability/Clinical Decision Making Evolving/Moderate complexity   worsening LE strength based on subjective reports   Rehab Potential Fair    PT Frequency 2x / week    PT Duration 8 weeks    PT Treatment/Interventions Therapeutic activities;Therapeutic exercise;Balance training;Neuromuscular re-education;Patient/family education;Manual techniques;Dry needling;Vestibular;Aquatic Therapy;Traction;Electrical Stimulation;Iontophoresis 4mg /ml Dexamethasone    PT Next Visit Plan posture, trunk, scapular, hip strengthening, balance, manual techniques, modalities PRN    Consulted and Agree with Plan of Care Patient            Patient will benefit from skilled therapeutic intervention in order to improve the following deficits and impairments:  Improper body mechanics, Postural dysfunction, Difficulty walking, Decreased strength, Dizziness, Decreased balance  Visit Diagnosis: Muscle weakness (generalized)  History of falling  Difficulty in walking, not elsewhere classified     Problem List Patient Active Problem List   Diagnosis Date Noted   Benign neoplasm of ascending colon    History of colon polyps    Benign neoplasm of transverse colon    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, [place], [address] Phone: [phone]   Fax:  [phone]  Name: RUSH SALCE MRN: [id number] Date of Birth: October 27, 1940

## 2020-03-14 ENCOUNTER — Other Ambulatory Visit: Payer: Self-pay

## 2020-03-14 ENCOUNTER — Ambulatory Visit: Payer: PPO

## 2020-03-14 DIAGNOSIS — M6281 Muscle weakness (generalized): Secondary | ICD-10-CM

## 2020-03-14 DIAGNOSIS — R262 Difficulty in walking, not elsewhere classified: Secondary | ICD-10-CM

## 2020-03-14 DIAGNOSIS — Z9181 History of falling: Secondary | ICD-10-CM

## 2020-03-14 NOTE — Therapy (Signed)
[place] [facility] [address], [place], [address] Phone: [phone]   Fax:  [phone]  Physical Therapy Treatment  Patient Details  Name: Brandon Clements MRN: [id number] Date of Birth: [birth year] Referring Provider (PT): Brandon Reek, MD   Encounter Date: 03/14/2020   PT End of Session - 03/14/20 0846    Visit Number 5    Number of Visits 17    Date for PT Re-Evaluation 04/21/20    Authorization Type 5    Authorization Time Period of 10 progress report    PT Start Time [phone]    PT Stop Time 0930    PT Time Calculation (min) 44 min    Activity Tolerance Patient tolerated treatment well    Behavior During Therapy [facility] for tasks assessed/performed           Past Medical History:  Diagnosis Date  . Arthritis    Right shoulder mostly and left shoulder  . Barrett's esophagus   . CAD (coronary artery disease) 2005   angioplasty with 3 stents  . 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 Colon polyp    adenomatous  . GERD (gastroesophageal reflux disease)   . Hiatal hernia   . Hx of radiation therapy 2011   left cheek, [facility] [facility]  . Hx of radiation therapy 12/30/13-01/26/14   scalp 5000 cGy in 20 sessions  . Hyperlipidemia   . Hypertension   . Kidney stones   . Myocardial infarction [facility]) 2005   "mild"  . Skin cancer 11/2013   right frontal scalp, squamous cell carcinoma, history basal cell ca  . Status post dilation of esophageal narrowing     Past Surgical History:  Procedure Laterality Date  . ANGIOPLASTY  2005   3 stents  . BIOPSY  12/22/2019   Procedure: BIOPSY;  Surgeon: Irene Shipper, MD;  Location: [facility];  Service: Endoscopy;;  . Cancer Sug[place]     Skin cancer  . cardiac stent     x3  . COLONOSCOPY  2010, 2006   hx adenomatous polyps 2006/ Dr. Henrene Pastor  . COLONOSCOPY N/A 12/07/2014   Procedure: COLONOSCOPY;   Surgeon: Irene Shipper, MD;  Location: [facility];  Service: Endoscopy;  Laterality: N/A;  . COLONOSCOPY WITH PROPOFOL N/A 12/22/2019   Procedure: COLONOSCOPY WITH PROPOFOL;  Surgeon: Irene Shipper, MD;  Location: [facility];  Service: Endoscopy;  Laterality: N/A;  . ESOPHAGOGASTRODUODENOSCOPY N/A 12/07/2014   Procedure: ESOPHAGOGASTRODUODENOSCOPY (EGD);  Surgeon: Irene Shipper, MD;  Location: [facility];  Service: Endoscopy;  Laterality: N/A;  . ESOPHAGOGASTRODUODENOSCOPY (EGD) WITH PROPOFOL N/A 12/22/2019   Procedure: ESOPHAGOGASTRODUODENOSCOPY (EGD) WITH PROPOFOL;  Surgeon: Irene Shipper, MD;  Location: [facility];  Service: Endoscopy;  Laterality: N/A;  . EXTERNAL EAR SURGERY Left    left ear removal  . NECK DISSECTION  Feb 2012   right  . POLYPECTOMY  12/22/2019   Procedure: POLYPECTOMY;  Surgeon: Irene Shipper, MD;  Location: [facility];  Service: Endoscopy;;  . RADICAL NECK DISSECTION  July 2012   left neck, jaw, and ear    There were no vitals filed for this visit.   Subjective Assessment - 03/14/20 0848    Subjective Going to have another operation of his head for his skin cancer March 28, 2020. L hip did not have any soreness  after last session. No pain currently. No falls since last week.  House [place] slope. Usually falls when his L foot gets stuck on the ground.    Pertinent History B LE weakness.Pt does not have an ear drum on L side due to CA surgery. Has difficulty with balance. Uses a SPC on L hand for balance. Pt is L hand dominant. [name] helps with balance. Pt fell 3 times in the past 6 months. Pt usually loses balance when he steps on a lower surface on his L side. Could not get outside and do anything during the pandemic and pt just sat in and watched TV and fle like his legs got weaker because of it.  Does not have back pain but Legs feel like they get numb L > R. Feet get really ice cold. Feel also feel fat. Usually does that when he has high blood sugar. Feels  numbness L lateral thigh and leg. Had x-rays which revealed deteriorating discs. Some days he can get up and walk fine. Other days, he feels like he does not have balance or can walk. Symptoms began about a year ago. Currently having a difficult time with balance. Pt used to be able to ambulate about 3 miles on the treadmill before the pandemic. Pt states being able to walk about 1/4 of a mile then his legs get weaker. Pt currently sits around most of the day.    Patient Stated Goals Improve strength in LE.    Currently in Pain? No/denies    Pain Score 0-No pain    Pain Onset More than a month ago                                     PT Education - 03/14/20 0946    Education Details ther-ex    Person(s) Educated Patient    Methods Explanation;Demonstration;Tactile cues;Verbal cues    Comprehension Returned demonstration;Verbalized understanding          Objective    No latex allergies  Blood pressure controlled per pt.  hx of CA  5 times sit to stand 18 seconds  (-) Long sit test  LTG sit <>stand withou tUE assist from regualr chair.   sitting with lumbar towel roll.  Increased L LE shakiness with trunk flexion, and L trunk rotation     FOTO next visit.   MedbridgeAccess Code [email or web address]  Therapeutic exercise  With gait belt CGA   Stepping over 4 mini hurdles 6x, LOB to the L multiple times   CGA to mod A  SLS with contralateral 2 finger light touch assist   R 10x2 with 5 second holds  L 10x2 with 5 second holds  CGA  Static mini lunge with one UE 2 finger assist while stepping over 1 mini hurdle  R 10x2  L 10x2  CGA   Forward step up onto and over 4 inch step with one UE 2 finger assist   R 10x  L 10x  CGA   Seated manually resisted L lateral shift isometrics to counter R lumbar lateral shift posture  10x5 seconds for 3 sets     Improved exercise technique, movement at target joints,  use of target muscles after mod verbal, visual, tactile cues.   Response to treatment Pt tolerated session well without aggravation of symptoms  Clinical impression Demonstrates difficulty with balance at times with exercises L LE >  R. Cues needed for weight shifting to pace his center of gravity over his base of support (target foot). Worked on balance and hip flexion to help promote foot clearance as well as decrease fall risk. Pt tolerated session well without aggravation of symptoms. Pt will benefit from skilled physical therapy services to improve balance, strength and function.        PT Short Term Goals - 02/22/20 1106      PT SHORT TERM GOAL #1   Title Patient will be independent with his initial home exercise program to improve B LE strength, to improve function, gait distance, and balance.    Time 3    Period Weeks    Status New    Target Date 03/17/20             PT Long Term Goals - 02/22/20 1109      PT LONG TERM GOAL #1   Title Patient will improve B LE strength by at least 1/2 MMT grade to improve function, balance, and decrease difficulty ambulating longer distances and performing transfers.    Time 8    Period Weeks    Status New    Target Date 04/21/20      PT LONG TERM GOAL #2   Title Pt will be able to stand up from a chair without UE assist as a demonstration of improved LE strength.    Baseline Pt stands up from a chair with arms with need of B UE assist (02/22/2020)    Time 8    Period Weeks    Status New    Target Date 04/21/20      PT LONG TERM GOAL #3   Title Pt will improve his FOTO score by at least 10 points as a demonstration of improved function.    Time 8    Period Weeks    Status New    Target Date 04/21/20      PT LONG TERM GOAL #4   Title Pt will improve his TUG time to 12 seconds or less as a demonstration of improved functional mobility and balance.    Baseline 13.3 seconds average (02/22/2020)    Time 8    Period Weeks     Status New    Target Date 04/21/20      PT LONG TERM GOAL #5   Title Pt will improve his five times sit to stand time to 12 seconds or less as a demonstration of improved LE functional strength.    Baseline 18 seconds (02/22/2020    Time 8    Period Weeks    Status New    Target Date 04/21/20                 Plan - 03/14/20 0944    Clinical Impression Statement Demonstrates difficulty with balance at times with exercises L LE > R. Cues needed for weight shifting to pace his center of gravity over his base of support (target foot). Worked on balance and hip flexion to help promote foot clearance as well as decrease fall risk. Pt tolerated session well without aggravation of symptoms. Pt will benefit from skilled physical therapy services to improve balance, strength and function.    Personal Factors and Comorbidities Age;Comorbidity 3+;Fitness;Past/Current Experience;Time since onset of injury/illness/exacerbation    Comorbidities CAD, hx of skin CA, L ear removal, hx of MI, HTN    Examination-Activity Limitations Squat;Stairs;Stand;Carry;Transfers;Bathing    Stability/Clinical Decision Making Evolving/Moderate complexity   worsening LE  strength based on subjective reports   Rehab Potential Fair    PT Frequency 2x / week    PT Duration 8 weeks    PT Treatment/Interventions Therapeutic activities;Therapeutic exercise;Balance training;Neuromuscular re-education;Patient/family education;Manual techniques;Dry needling;Vestibular;Aquatic Therapy;Traction;Electrical Stimulation;Iontophoresis 4mg /ml Dexamethasone    PT Next Visit Plan posture, trunk, scapular, hip strengthening, balance, manual techniques, modalities PRN    Consulted and Agree with Plan of Care Patient           Patient will benefit from skilled therapeutic intervention in order to improve the following deficits and impairments:  Improper body mechanics, Postural dysfunction, Difficulty walking, Decreased strength,  Dizziness, Decreased balance  Visit Diagnosis: Muscle weakness (generalized)  History of falling  Difficulty in walking, not elsewhere classified     Problem List Patient Active Problem List   Diagnosis Date Noted  . Benign neoplasm of ascending colon   . History of colon polyps   . Benign neoplasm of transverse colon   . 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 CORONARY ARTERY DISEASE 04/02/2008  . GERD 04/02/2008  . PERSONAL HX COLONIC POLYPS 04/02/2008  . ESOPHAGITIS 04/01/2008  . BARRETTS ESOPHAGUS 04/01/2008  . HIATAL HERNIA 04/01/2008  . SKIN CANCER, HX OF 04/01/2008  . NEPHROLITHIASIS, HX OF 04/01/2008    Joneen Boers PT, DPT   03/14/2020, 9:47 AM  [place] [facility] [address], [place], [address] Phone: [phone]   Fax:  [phone]  Name: Brandon Clements MRN: [id number] Date of Birth: 07-09-1940

## 2020-03-15 DIAGNOSIS — H2513 Age-related nuclear cataract, bilateral: Secondary | ICD-10-CM | POA: Diagnosis not present

## 2020-03-15 DIAGNOSIS — H02155 Paralytic ectropion of left lower eyelid: Secondary | ICD-10-CM | POA: Diagnosis not present

## 2020-03-15 DIAGNOSIS — H16142 Punctate keratitis, left eye: Secondary | ICD-10-CM | POA: Diagnosis not present

## 2020-03-15 DIAGNOSIS — E119 Type 2 diabetes mellitus without complications: Secondary | ICD-10-CM | POA: Diagnosis not present

## 2020-03-15 DIAGNOSIS — H10233 Serous conjunctivitis, except viral, bilateral: Secondary | ICD-10-CM | POA: Diagnosis not present

## 2020-03-16 ENCOUNTER — Ambulatory Visit: Payer: PPO

## 2020-03-21 DIAGNOSIS — C4442 Squamous cell carcinoma of skin of scalp and neck: Secondary | ICD-10-CM | POA: Diagnosis not present

## 2020-03-21 DIAGNOSIS — Z85828 Personal history of other malignant neoplasm of skin: Secondary | ICD-10-CM | POA: Diagnosis not present

## 2020-03-22 ENCOUNTER — Ambulatory Visit: Payer: PPO

## 2020-03-23 DIAGNOSIS — I2510 Atherosclerotic heart disease of native coronary artery without angina pectoris: Secondary | ICD-10-CM | POA: Diagnosis not present

## 2020-03-23 DIAGNOSIS — E119 Type 2 diabetes mellitus without complications: Secondary | ICD-10-CM | POA: Diagnosis not present

## 2020-03-23 DIAGNOSIS — I10 Essential (primary) hypertension: Secondary | ICD-10-CM | POA: Diagnosis not present

## 2020-03-23 DIAGNOSIS — E782 Mixed hyperlipidemia: Secondary | ICD-10-CM | POA: Diagnosis not present

## 2020-03-24 DIAGNOSIS — H16142 Punctate keratitis, left eye: Secondary | ICD-10-CM | POA: Diagnosis not present

## 2020-03-24 DIAGNOSIS — H10233 Serous conjunctivitis, except viral, bilateral: Secondary | ICD-10-CM | POA: Diagnosis not present

## 2020-03-24 DIAGNOSIS — H35373 Puckering of macula, bilateral: Secondary | ICD-10-CM | POA: Diagnosis not present

## 2020-03-24 DIAGNOSIS — Z7984 Long term (current) use of oral hypoglycemic drugs: Secondary | ICD-10-CM | POA: Diagnosis not present

## 2020-03-24 DIAGNOSIS — H02155 Paralytic ectropion of left lower eyelid: Secondary | ICD-10-CM | POA: Diagnosis not present

## 2020-03-24 DIAGNOSIS — H4322 Crystalline deposits in vitreous body, left eye: Secondary | ICD-10-CM | POA: Diagnosis not present

## 2020-03-24 DIAGNOSIS — H43813 Vitreous degeneration, bilateral: Secondary | ICD-10-CM | POA: Diagnosis not present

## 2020-03-24 DIAGNOSIS — H2513 Age-related nuclear cataract, bilateral: Secondary | ICD-10-CM | POA: Diagnosis not present

## 2020-03-24 DIAGNOSIS — E119 Type 2 diabetes mellitus without complications: Secondary | ICD-10-CM | POA: Diagnosis not present

## 2020-03-31 DIAGNOSIS — Z85828 Personal history of other malignant neoplasm of skin: Secondary | ICD-10-CM | POA: Diagnosis not present

## 2020-03-31 DIAGNOSIS — R0989 Other specified symptoms and signs involving the circulatory and respiratory systems: Secondary | ICD-10-CM | POA: Diagnosis not present

## 2020-03-31 DIAGNOSIS — E538 Deficiency of other specified B group vitamins: Secondary | ICD-10-CM | POA: Diagnosis not present

## 2020-03-31 DIAGNOSIS — E519 Thiamine deficiency, unspecified: Secondary | ICD-10-CM | POA: Diagnosis not present

## 2020-03-31 DIAGNOSIS — R202 Paresthesia of skin: Secondary | ICD-10-CM | POA: Diagnosis not present

## 2020-03-31 DIAGNOSIS — E119 Type 2 diabetes mellitus without complications: Secondary | ICD-10-CM | POA: Diagnosis not present

## 2020-03-31 DIAGNOSIS — R200 Anesthesia of skin: Secondary | ICD-10-CM | POA: Diagnosis not present

## 2020-03-31 DIAGNOSIS — E531 Pyridoxine deficiency: Secondary | ICD-10-CM | POA: Diagnosis not present

## 2020-03-31 DIAGNOSIS — E559 Vitamin D deficiency, unspecified: Secondary | ICD-10-CM | POA: Diagnosis not present

## 2020-04-01 DIAGNOSIS — E559 Vitamin D deficiency, unspecified: Secondary | ICD-10-CM | POA: Diagnosis not present

## 2020-04-01 DIAGNOSIS — R200 Anesthesia of skin: Secondary | ICD-10-CM | POA: Diagnosis not present

## 2020-04-01 DIAGNOSIS — E531 Pyridoxine deficiency: Secondary | ICD-10-CM | POA: Diagnosis not present

## 2020-04-01 DIAGNOSIS — R202 Paresthesia of skin: Secondary | ICD-10-CM | POA: Diagnosis not present

## 2020-04-01 DIAGNOSIS — E519 Thiamine deficiency, unspecified: Secondary | ICD-10-CM | POA: Diagnosis not present

## 2020-04-01 DIAGNOSIS — E538 Deficiency of other specified B group vitamins: Secondary | ICD-10-CM | POA: Diagnosis not present

## 2020-04-05 ENCOUNTER — Other Ambulatory Visit (INDEPENDENT_AMBULATORY_CARE_PROVIDER_SITE_OTHER): Payer: Self-pay | Admitting: Neurology

## 2020-04-05 DIAGNOSIS — R202 Paresthesia of skin: Secondary | ICD-10-CM

## 2020-04-07 ENCOUNTER — Telehealth: Payer: Self-pay

## 2020-04-07 ENCOUNTER — Ambulatory Visit: Payer: PPO

## 2020-04-07 NOTE — Telephone Encounter (Signed)
No show. Called patient and left a message pertaining to appointment and a reminder for the next follow up session. Return phone call requested. Phone number ([phone]) provided.   

## 2020-04-18 ENCOUNTER — Other Ambulatory Visit: Payer: Self-pay

## 2020-04-18 ENCOUNTER — Ambulatory Visit: Payer: PPO | Attending: Internal Medicine

## 2020-04-18 DIAGNOSIS — Z9181 History of falling: Secondary | ICD-10-CM | POA: Diagnosis not present

## 2020-04-18 DIAGNOSIS — M6281 Muscle weakness (generalized): Secondary | ICD-10-CM | POA: Diagnosis not present

## 2020-04-18 DIAGNOSIS — R262 Difficulty in walking, not elsewhere classified: Secondary | ICD-10-CM | POA: Diagnosis not present

## 2020-04-18 NOTE — Patient Instructions (Signed)
Seated hip extension isometrics   Sitting on a chair,    Squeeze your rear end muscles together and press your LEFT foot onto the floor.     Hold for 5 seconds    Repeat 10 times   Perform 3 sets daily.      This is a corrective exercise. Once you no longer have symptoms, you can stop.

## 2020-04-18 NOTE — Therapy (Signed)
[facility] [address], [place], [address] Phone: [phone]   Fax:  [phone]  Physical Therapy Treatment  Patient Details  Name: Brandon Clements MRN: [id number] Date of Birth: Jan 08, 1941 Referring Provider (PT): Aram Beecham, MD   Encounter Date: 04/18/2020   PT End of Session - 04/18/20 1300    Visit Number 6    Number of Visits 17    Date for PT Re-Evaluation 04/21/20    Authorization Type 6    Authorization Time Period of 10 progress report    PT Start Time 1300    PT Stop Time 1343    PT Time Calculation (min) 43 min    Activity Tolerance Patient tolerated treatment well    Behavior During Therapy [facility] for tasks assessed/performed           Past Medical History:  Diagnosis Date  . Arthritis    Right shoulder mostly and left shoulder  . Barrett's esophagus   . CAD (coronary artery disease) 2005   angioplasty with 3 stents  . 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 Colon polyp    adenomatous  . GERD (gastroesophageal reflux disease)   . Hiatal hernia   . Hx of radiation therapy 2011   left cheek, [place] [facility]  . Hx of radiation therapy 12/30/13-01/26/14   scalp 5000 cGy in 20 sessions  . Hyperlipidemia   . Hypertension   . Kidney stones   . Myocardial infarction [facility]) 2005   "mild"  . Skin cancer 11/2013   right frontal scalp, squamous cell carcinoma, history basal cell ca  . Status post dilation of esophageal narrowing     Past Surgical History:  Procedure Laterality Date  . ANGIOPLASTY  2005   3 stents  . BIOPSY  12/22/2019   Procedure: BIOPSY;  Surgeon: Hilarie Fredrickson, MD;  Location: [facility];  Service: Endoscopy;;  . Cancer [place]     Skin cancer  . cardiac stent     x3  . COLONOSCOPY  2010, 2006   hx adenomatous polyps 2006/ Dr. Marina Goodell  . COLONOSCOPY N/A 12/07/2014   Procedure: COLONOSCOPY;   Surgeon: Hilarie Fredrickson, MD;  Location: [facility];  Service: Endoscopy;  Laterality: N/A;  . COLONOSCOPY WITH PROPOFOL N/A 12/22/2019   Procedure: COLONOSCOPY WITH PROPOFOL;  Surgeon: Hilarie Fredrickson, MD;  Location: [facility];  Service: Endoscopy;  Laterality: N/A;  . ESOPHAGOGASTRODUODENOSCOPY N/A 12/07/2014   Procedure: ESOPHAGOGASTRODUODENOSCOPY (EGD);  Surgeon: Hilarie Fredrickson, MD;  Location: [facility];  Service: Endoscopy;  Laterality: N/A;  . ESOPHAGOGASTRODUODENOSCOPY (EGD) WITH PROPOFOL N/A 12/22/2019   Procedure: ESOPHAGOGASTRODUODENOSCOPY (EGD) WITH PROPOFOL;  Surgeon: Hilarie Fredrickson, MD;  Location: [facility];  Service: Endoscopy;  Laterality: N/A;  . EXTERNAL EAR SURGERY Left    left ear removal  . NECK DISSECTION  Feb 2012   right  . POLYPECTOMY  12/22/2019   Procedure: POLYPECTOMY;  Surgeon: Hilarie Fredrickson, MD;  Location: [facility];  Service: Endoscopy;;  . RADICAL NECK DISSECTION  July 2012   left neck, jaw, and ear    There were no vitals filed for this visit.   Subjective Assessment - 04/18/20 1301    Subjective Some days, cant hardly walk but other days, seems like he can walk. Walked about 0.25 miles yesterday. No falls recently. Feet  are still getting cold but not as much. Feels B lateral leg and foot coldness (L5 dermatome). Pt wants to graduate next visit and continue his progress with his HEP.    Pertinent History B LE weakness.Pt does not have an ear drum on L side due to CA surgery. Has difficulty with balance. Uses a SPC on L hand for balance. Pt is L hand dominant. Gilmer Mor helps with balance. Pt fell 3 times in the past 6 months. Pt usually loses balance when he steps on a lower surface on his L side. Could not get outside and do anything during the pandemic and pt just sat in and watched TV and fle like his legs got weaker because of it.  Does not have back pain but Legs feel like they get numb L > R. Feet get really ice cold. Feel also feel fat. Usually does that when  he has high blood sugar. Feels numbness L lateral thigh and leg. Had x-rays which revealed deteriorating discs. Some days he can get up and walk fine. Other days, he feels like he does not have balance or can walk. Symptoms began about a year ago. Currently having a difficult time with balance. Pt used to be able to ambulate about 3 miles on the treadmill before the pandemic. Pt states being able to walk about 1/4 of a mile then his legs get weaker. Pt currently sits around most of the day.    Patient Stated Goals Improve strength in LE.    Currently in Pain? No/denies    Pain Score 0-No pain    Pain Onset More than a month ago                                     PT Education - 04/18/20 1312    Education Details ther-ex    Person(s) Educated Patient    Methods Explanation;Demonstration;Tactile cues;Verbal cues    Comprehension Verbalized understanding;Returned demonstration          Objective    No latex allergies  Blood pressure controlled per pt.  hx of CA  5 times sit to stand 18 seconds  (-) Long sit test  LTG sit <>stand withou tUE assist from regualr chair.   sitting with lumbar towel roll.  Increased L LE shakiness with trunk flexion, and L trunk rotation     FOTO next visit.   MedbridgeAccess Code [email or web address]  Therapeutic exercise    Seated manually resisted L lateral shift isometrics to counter R lumbar lateral shift posture  10x5 seconds for 3 sets  Seated manually resisted L upper trunk rotation to promote more neutral lumbar posture 10x3 with 5 second holds    seated L hip extension isometrics 10x3 with 5 second holds   Improved LE circulation reported afterwards  Reviewed and given as part of his HEP. Pt demonstrated and verbalized understanding. Handout provided.    Sit < > stand from regular chair with arms with B UE assist 10x. LOB 1x forward, min A to recover. Pt states feelign a little  wavy, only has one ear drum.   Then 5x fast: 18 seconds  Standing up from a chair, walking 10 ft forward, then returning 10 ft, then sitting back onto chair 3x  12 seconds, 9 seconds, 9 seconds (10 seconds average)  Reviewed progress/current status with 5 times sit <> stand and with TUG and    SLS  with contralateral 2 finger light touch assist              R 10x2 with 5 second holds             L 10x2 with 5 second holds           SBA   Improved exercise technique, movement at target joints, use of target muscles after mod verbal, visual, tactile cues.   Pt states wanting to graduate next visit and continue with his HEP.     Response to treatment Pt tolerated session well without aggravation of symptoms. Improved sensation of circulation B LE and the ground felt more firm during gait after session reported.  Clinical impression Improved TUG time since initial evaluation. Pt demonstrates improved sensation of B leg circulation with treatment to promote L glute max activation and lumbar posture. Continued working on glute med strength/single leg balance as well to decrease fall risk. Pt tolerated session well without aggravation of symptoms. Pt will benefit from continued skilled physical therapy services to improve strength, balance, and function.            PT Short Term Goals - 02/22/20 1106      PT SHORT TERM GOAL #1   Title Patient will be independent with his initial home exercise program to improve B LE strength, to improve function, gait distance, and balance.    Time 3    Period Weeks    Status New    Target Date 03/17/20             PT Long Term Goals - 02/22/20 1109      PT LONG TERM GOAL #1   Title Patient will improve B LE strength by at least 1/2 MMT grade to improve function, balance, and decrease difficulty ambulating longer distances and performing transfers.    Time 8    Period Weeks    Status New    Target Date 04/21/20      PT LONG  TERM GOAL #2   Title Pt will be able to stand up from a chair without UE assist as a demonstration of improved LE strength.    Baseline Pt stands up from a chair with arms with need of B UE assist (02/22/2020)    Time 8    Period Weeks    Status New    Target Date 04/21/20      PT LONG TERM GOAL #3   Title Pt will improve his FOTO score by at least 10 points as a demonstration of improved function.    Time 8    Period Weeks    Status New    Target Date 04/21/20      PT LONG TERM GOAL #4   Title Pt will improve his TUG time to 12 seconds or less as a demonstration of improved functional mobility and balance.    Baseline 13.3 seconds average (02/22/2020)    Time 8    Period Weeks    Status New    Target Date 04/21/20      PT LONG TERM GOAL #5   Title Pt will improve his five times sit to stand time to 12 seconds or less as a demonstration of improved LE functional strength.    Baseline 18 seconds (02/22/2020    Time 8    Period Weeks    Status New    Target Date 04/21/20  Plan - 04/18/20 1256    Clinical Impression Statement Improved TUG time since initial evaluation. Pt demonstrates improved sensation of B leg circulation with treatment to promote L glute max activation and lumbar posture. Continued working on glute med strength/single leg balance as well to decrease fall risk. Pt tolerated session well without aggravation of symptoms. Pt will benefit from continued skilled physical therapy services to improve strength, balance, and function.    Personal Factors and Comorbidities Age;Comorbidity 3+;Fitness;Past/Current Experience;Time since onset of injury/illness/exacerbation    Comorbidities CAD, hx of skin CA, L ear removal, hx of MI, HTN    Examination-Activity Limitations Squat;Stairs;Stand;Carry;Transfers;Bathing    Stability/Clinical Decision Making Evolving/Moderate complexity   worsening LE strength based on subjective reports   Rehab Potential Fair     PT Frequency 2x / week    PT Duration 8 weeks    PT Treatment/Interventions Therapeutic activities;Therapeutic exercise;Balance training;Neuromuscular re-education;Patient/family education;Manual techniques;Dry needling;Vestibular;Aquatic Therapy;Traction;Electrical Stimulation;Iontophoresis 4mg /ml Dexamethasone    PT Next Visit Plan posture, trunk, scapular, hip strengthening, balance, manual techniques, modalities PRN    Consulted and Agree with Plan of Care Patient           Patient will benefit from skilled therapeutic intervention in order to improve the following deficits and impairments:  Improper body mechanics,Postural dysfunction,Difficulty walking,Decreased strength,Dizziness,Decreased balance  Visit Diagnosis: Muscle weakness (generalized)  History of falling  Difficulty in walking, not elsewhere classified     Problem List Patient Active Problem List   Diagnosis Date Noted  . Benign neoplasm of ascending colon   . History of colon polyps   . Benign neoplasm of transverse colon   . 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 CORONARY ARTERY DISEASE 04/02/2008  . GERD 04/02/2008  . PERSONAL HX COLONIC POLYPS 04/02/2008  . ESOPHAGITIS 04/01/2008  . BARRETTS ESOPHAGUS 04/01/2008  . HIATAL HERNIA 04/01/2008  . SKIN CANCER, HX OF 04/01/2008  . NEPHROLITHIASIS, HX OF 04/01/2008     Joneen Boers PT, DPT   04/18/2020, 2:09 PM  [place] [facility] [address] [place], [address] Phone: [phone]   Fax:  [phone]  Name: Brandon Clements MRN: [id number] Date of Birth: 1940/12/15

## 2020-04-20 ENCOUNTER — Other Ambulatory Visit: Payer: Self-pay

## 2020-04-20 ENCOUNTER — Ambulatory Visit: Payer: PPO

## 2020-04-20 DIAGNOSIS — R262 Difficulty in walking, not elsewhere classified: Secondary | ICD-10-CM

## 2020-04-20 DIAGNOSIS — M6281 Muscle weakness (generalized): Secondary | ICD-10-CM | POA: Diagnosis not present

## 2020-04-20 DIAGNOSIS — Z9181 History of falling: Secondary | ICD-10-CM

## 2020-04-20 NOTE — Therapy (Signed)
[facility] [address], [place], [address] Phone: [phone]   Fax:  [phone]  Physical Therapy Treatment And Discharge Summary  Patient Details  Name: Brandon Clements MRN: [id number] Date of Birth: 30-Jun-1940 Referring Provider (PT): Fulton Reek, MD   Encounter Date: 04/20/2020   PT End of Session - 04/20/20 1300    Visit Number 7    Number of Visits 17    Date for PT Re-Evaluation 04/21/20    Authorization Type 7    Authorization Time Period of 10 progress report    PT Start Time 1301    PT Stop Time 8563    PT Time Calculation (min) 44 min    Activity Tolerance Patient tolerated treatment well    Behavior During Therapy [facility] for tasks assessed/performed           Past Medical History:  Diagnosis Date   Arthritis    Right shoulder mostly and left shoulder   Barrett's esophagus    CAD (coronary artery disease) 2005   angioplasty with 3 stents   Cancer ([place])    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   10/2010, squamous cell cancer  right jaw 2012     Colon polyp    adenomatous   GERD (gastroesophageal reflux disease)    Hiatal hernia    Hx of radiation therapy 2011   left cheek, [facility]   Hx of radiation therapy 12/30/13-01/26/14   scalp 5000 cGy in 20 sessions   Hyperlipidemia    Hypertension    Kidney stones    Myocardial infarction [facility]) 2005   "mild"   Skin cancer 11/2013   right frontal scalp, squamous cell carcinoma, history basal cell ca   Status post dilation of esophageal narrowing     Past Surgical History:  Procedure Laterality Date   ANGIOPLASTY  2005   3 stents   BIOPSY  12/22/2019   Procedure: BIOPSY;  Surgeon: Irene Shipper, MD;  Location: [facility];  Service: Endoscopy;;   Cancer Sugery     Skin cancer   cardiac stent     x3   COLONOSCOPY  2010, 2006   hx adenomatous polyps 2006/ Dr. Henrene Pastor   COLONOSCOPY N/A 12/07/2014    Procedure: COLONOSCOPY;  Surgeon: Irene Shipper, MD;  Location: [facility];  Service: Endoscopy;  Laterality: N/A;   COLONOSCOPY WITH PROPOFOL N/A 12/22/2019   Procedure: COLONOSCOPY WITH PROPOFOL;  Surgeon: Irene Shipper, MD;  [facility]: [facility];  Service: Endoscopy;  Laterality: N/A;   ESOPHAGOGASTRODUODENOSCOPY N/A 12/07/2014   Procedure: ESOPHAGOGASTRODUODENOSCOPY (EGD);  Surgeon: Irene Shipper, MD;  Location: [facility];  Service: Endoscopy;  Laterality: N/A;   ESOPHAGOGASTRODUODENOSCOPY (EGD) WITH PROPOFOL N/A 12/22/2019   Procedure: ESOPHAGOGASTRODUODENOSCOPY (EGD) WITH PROPOFOL;  Surgeon: Irene Shipper, MD;  [facility];  Service: Endoscopy;  Laterality: N/A;   EXTERNAL EAR SURGERY Left    left ear removal   NECK DISSECTION  Feb 2012   right   POLYPECTOMY  12/22/2019   Procedure: POLYPECTOMY;  Surgeon: Irene Shipper, MD;  [facility]: [facility];  Service: Endoscopy;;   RADICAL NECK DISSECTION  July 2012   left neck, jaw, and ear    There were no vitals filed for this visit.   Subjective Assessment - 04/20/20 1302    Subjective Feet are not as cold as it has been... better. No pain or discomfort. Feels slow today. Still wants  to graduate today... has a lot going on... got custody of his grandkids. L foot does not drag as much since PT.    Pertinent History B LE weakness.Pt does not have an ear drum on L side due to CA surgery. Has difficulty with balance. Uses a SPC on L hand for balance. Pt is L hand dominant. [name] helps with balance. Pt fell 3 times in the past 6 months. Pt usually loses balance when he steps on a lower surface on his L side. Could not get outside and do anything during the pandemic and pt just sat in and watched TV and fle like his legs got weaker because of it.  Does not have back pain but Legs feel like they get numb L > R. Feet get really ice cold. Feel also feel fat. Usually does that when he has high blood sugar. Feels numbness L lateral thigh  and leg. Had x-rays which revealed deteriorating discs. Some days he can get up and walk fine. Other days, he feels like he does not have balance or can walk. Symptoms began about a year ago. Currently having a difficult time with balance. Pt used to be able to ambulate about 3 miles on the treadmill before the pandemic. Pt states being able to walk about 1/4 of a mile then his legs get weaker. Pt currently sits around most of the day.    Patient Stated Goals Improve strength in LE.    Currently in Pain? No/denies    Pain Onset More than a month ago              [facility] PT Assessment - 04/20/20 1305      Strength   Right Hip Flexion 4+/5    Right Hip Extension 4+/5   seated manually resisted   Right Hip ABduction 4+/5    Left Hip Flexion 4+/5    Left Hip Extension 4+/5   seated manually resisted   Left Hip ABduction 4+/5    Right Knee Flexion 5/5    Right Knee Extension 5/5    Left Knee Flexion 4+/5    Left Knee Extension 5/5    Right Ankle Dorsiflexion 4/5   seated manually resisted   Right Ankle Plantar Flexion 4+/5   seated manually resisted   Left Ankle Dorsiflexion 4-/5   seated manually resisted   Left Ankle Plantar Flexion 4/5   seated manually resisted                                PT Education - 04/20/20 1319    Education Details ther-ex    Person(s) Educated Patient    Methods Explanation;Demonstration;Tactile cues;Verbal cues    Comprehension Returned demonstration;Verbalized understanding          Objective    No latex allergies  Blood pressure controlled per pt.  hx of CA  5 times sit to stand 18 seconds  (-) Long sit test  LTG sit <>stand withou tUE assist from regualr chair.   sitting with lumbar towel roll.  Increased L LE shakiness with trunk flexion, and L trunk rotation     FOTO next visit.   MedbridgeAccess Code [email or web address]  Therapeutic exercise    Seated manually resisted hip flexion, hip  extension , knee flexion, knee extension, S/L hip abduction, Ankle DF and PF 1-2x each way for each LE  (everything else improved. Stayed the same: L ankle DF,  S/L hip abduction   R 10x2  L 10x2   Sit < > stand from regular chair with arms with B UE assist 5x.  Then 5x fast: 16 seconds  Seated manually resisted L lateral shift isometrics to counter R lumbar lateral shift posture  10x5 seconds for 3 sets  seated L hip extension isometrics 10x3 with 5 second holds   Standing PNF chops to the L red band 10x5 seconds for 3 sets   Reviewed and given as part of his HEP. Pt demonstrated and verbalized understanding. Handout provided.     Improved exercise technique, movement at target joints, use of target muscles after mod verbal, visual, tactile cues.     Response to treatment Pt tolerated session well without aggravation of symptoms.   Clinical impression Pt demonstrates overall improved LE strength, TUG and 5 times sit <> stand times since initial evaluation. Improved LE sensation as well as decreased L foot drag reported. Pt has made good progress with PT towards goals. Skilled physical therapy services discharged with pt continuing progress with his exercises at home secondary to good outcomes as well as pt input secondary to busy schedule with his grandchildren.       PT Short Term Goals - 04/20/20 1303      PT SHORT TERM GOAL #1   Title Patient will be independent with his initial home exercise program to improve B LE strength, to improve function, gait distance, and balance.    Baseline Pt has not questions with his HEP.  Has been doing his HEP some (04/20/2020)    Time 3    Period Weeks    Status Achieved    Target Date 03/17/20             PT Long Term Goals - 04/20/20 1320      PT LONG TERM GOAL #1   Title Patient will improve B LE strength by at least 1/2 MMT grade to improve function, balance, and decrease difficulty ambulating longer  distances and performing transfers.    Baseline Pt demonstrates overall improved LE strength since initial evaluation (04/20/2020)    Time 8    Period Weeks    Status Partially Met    Target Date 04/21/20      PT LONG TERM GOAL #2   Title Pt will be able to stand up from a chair without UE assist as a demonstration of improved LE strength.    Baseline Pt stands up from a chair with arms with need of B UE assist (02/22/2020)    Time 8    Period Weeks    Status New      PT LONG TERM GOAL #3   Title Pt will improve his FOTO score by at least 10 points as a demonstration of improved function.    Baseline Initial FOTO score was not able to be obtained (04/20/2020)    Time 8    Period Weeks    Status Unable to assess    Target Date 04/21/20      PT LONG TERM GOAL #4   Title Pt will improve his TUG time to 12 seconds or less as a demonstration of improved functional mobility and balance.    Baseline 13.3 seconds average (02/22/2020); 10 seconds average (04/18/2020)    Time 8    Period Weeks    Status Partially Met    Target Date 04/21/20      PT LONG TERM GOAL #5   Title Pt  will improve his five times sit to stand time to 12 seconds or less as a demonstration of improved LE functional strength.    Baseline 18 seconds (02/22/2020); 16 seconds (04/20/2020)    Time 8    Period Weeks    Status Partially Met    Target Date 04/21/20                 Plan - 04/20/20 1252    Clinical Impression Statement Pt demonstrates overall improved LE strength, TUG and 5 times sit <> stand times since initial evaluation. Improved LE sensation as well as decreased L foot drag reported. Pt has made good progress with PT towards goals. Skilled physical therapy services discharged with pt continuing progress with his exercises at home secondary to good outcomes as well as pt input secondary to busy schedule with his grandchildren.    Personal Factors and Comorbidities Age;Comorbidity  3+;Fitness;Past/Current Experience;Time since onset of injury/illness/exacerbation    Comorbidities CAD, hx of skin CA, L ear removal, hx of MI, HTN    Examination-Activity Limitations Squat;Stairs;Stand;Carry;Transfers;Bathing    Stability/Clinical Decision Making Stable/Uncomplicated   worsening LE strength based on subjective reports   Clinical Decision Making Low    Rehab Potential Fair    PT Frequency --    PT Duration --    PT Treatment/Interventions Therapeutic activities;Therapeutic exercise;Balance training;Neuromuscular re-education;Patient/family education;Manual techniques    PT Next Visit Plan Continue progress with his exercises at home.    PT Home Exercise Plan Medbridge Access Code [email or web address]    Consulted and Agree with Plan of Care Patient           Patient will benefit from skilled therapeutic intervention in order to improve the following deficits and impairments:  Improper body mechanics,Postural dysfunction,Difficulty walking,Decreased strength,Dizziness,Decreased balance  Visit Diagnosis: Muscle weakness (generalized)  History of falling  Difficulty in walking, not elsewhere classified     Problem List Patient Active Problem List   Diagnosis Date Noted   Benign neoplasm of ascending colon    History of colon polyps    Benign neoplasm of transverse colon    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   Joneen Boers PT, DPT   04/20/2020, 2:52 PM  [facility] [address] [place], [address] Phone: [phone]   Fax:  [phone]  Name: Brandon Clements MRN:  [id number] Date of Birth: 08-Feb-1941

## 2020-04-20 NOTE — Patient Instructions (Addendum)
Access Code: [email or web address] URL: [email or web address] Date: 04/20/2020 Prepared by: Loralyn Freshwater  Exercises Standing Single Leg Stance with Unilateral Counter Support - 1 x daily - 7 x weekly - 3 sets - 10 reps - 5 seconds hold Standing Hip Abduction with Resistance at Ankles and Counter Support - 1 x daily - 7 x weekly - 3 sets - 10 reps    Standing PNF chops to the L red band 10x5 seconds for 3 sets   Reviewed and given as part of his HEP. Pt demonstrated and verbalized understanding. Handout provided.

## 2020-04-28 ENCOUNTER — Other Ambulatory Visit: Payer: Self-pay

## 2020-04-28 ENCOUNTER — Ambulatory Visit (INDEPENDENT_AMBULATORY_CARE_PROVIDER_SITE_OTHER): Payer: PPO

## 2020-04-28 DIAGNOSIS — R202 Paresthesia of skin: Secondary | ICD-10-CM | POA: Diagnosis not present

## 2020-04-28 DIAGNOSIS — R200 Anesthesia of skin: Secondary | ICD-10-CM

## 2020-05-02 DIAGNOSIS — R200 Anesthesia of skin: Secondary | ICD-10-CM | POA: Diagnosis not present

## 2020-05-02 DIAGNOSIS — R202 Paresthesia of skin: Secondary | ICD-10-CM | POA: Diagnosis not present

## 2020-05-11 DIAGNOSIS — Z125 Encounter for screening for malignant neoplasm of prostate: Secondary | ICD-10-CM | POA: Diagnosis not present

## 2020-05-11 DIAGNOSIS — I2510 Atherosclerotic heart disease of native coronary artery without angina pectoris: Secondary | ICD-10-CM | POA: Diagnosis not present

## 2020-05-11 DIAGNOSIS — Z79899 Other long term (current) drug therapy: Secondary | ICD-10-CM | POA: Diagnosis not present

## 2020-05-11 DIAGNOSIS — E538 Deficiency of other specified B group vitamins: Secondary | ICD-10-CM | POA: Diagnosis not present

## 2020-05-11 DIAGNOSIS — E118 Type 2 diabetes mellitus with unspecified complications: Secondary | ICD-10-CM | POA: Diagnosis not present

## 2020-05-11 DIAGNOSIS — E782 Mixed hyperlipidemia: Secondary | ICD-10-CM | POA: Diagnosis not present

## 2020-05-11 DIAGNOSIS — I10 Essential (primary) hypertension: Secondary | ICD-10-CM | POA: Diagnosis not present

## 2020-05-26 DIAGNOSIS — E538 Deficiency of other specified B group vitamins: Secondary | ICD-10-CM | POA: Diagnosis not present

## 2020-06-07 DIAGNOSIS — E118 Type 2 diabetes mellitus with unspecified complications: Secondary | ICD-10-CM | POA: Diagnosis not present

## 2020-06-07 DIAGNOSIS — E538 Deficiency of other specified B group vitamins: Secondary | ICD-10-CM | POA: Diagnosis not present

## 2020-06-07 DIAGNOSIS — R29898 Other symptoms and signs involving the musculoskeletal system: Secondary | ICD-10-CM | POA: Diagnosis not present

## 2020-06-15 DIAGNOSIS — D0439 Carcinoma in situ of skin of other parts of face: Secondary | ICD-10-CM | POA: Diagnosis not present

## 2020-06-15 DIAGNOSIS — L570 Actinic keratosis: Secondary | ICD-10-CM | POA: Diagnosis not present

## 2020-06-15 DIAGNOSIS — Z85828 Personal history of other malignant neoplasm of skin: Secondary | ICD-10-CM | POA: Diagnosis not present

## 2020-06-15 DIAGNOSIS — D044 Carcinoma in situ of skin of scalp and neck: Secondary | ICD-10-CM | POA: Diagnosis not present

## 2020-06-15 DIAGNOSIS — L814 Other melanin hyperpigmentation: Secondary | ICD-10-CM | POA: Diagnosis not present

## 2020-06-15 DIAGNOSIS — C4442 Squamous cell carcinoma of skin of scalp and neck: Secondary | ICD-10-CM | POA: Diagnosis not present

## 2020-06-15 DIAGNOSIS — L821 Other seborrheic keratosis: Secondary | ICD-10-CM | POA: Diagnosis not present

## 2020-06-15 DIAGNOSIS — D225 Melanocytic nevi of trunk: Secondary | ICD-10-CM | POA: Diagnosis not present

## 2020-06-22 DIAGNOSIS — H10233 Serous conjunctivitis, except viral, bilateral: Secondary | ICD-10-CM | POA: Diagnosis not present

## 2020-06-22 DIAGNOSIS — H02155 Paralytic ectropion of left lower eyelid: Secondary | ICD-10-CM | POA: Diagnosis not present

## 2020-06-22 DIAGNOSIS — E119 Type 2 diabetes mellitus without complications: Secondary | ICD-10-CM | POA: Diagnosis not present

## 2020-06-22 DIAGNOSIS — H35373 Puckering of macula, bilateral: Secondary | ICD-10-CM | POA: Diagnosis not present

## 2020-06-22 DIAGNOSIS — H43813 Vitreous degeneration, bilateral: Secondary | ICD-10-CM | POA: Diagnosis not present

## 2020-06-22 DIAGNOSIS — Z7984 Long term (current) use of oral hypoglycemic drugs: Secondary | ICD-10-CM | POA: Diagnosis not present

## 2020-06-22 DIAGNOSIS — H16142 Punctate keratitis, left eye: Secondary | ICD-10-CM | POA: Diagnosis not present

## 2020-06-22 DIAGNOSIS — H4322 Crystalline deposits in vitreous body, left eye: Secondary | ICD-10-CM | POA: Diagnosis not present

## 2020-06-22 DIAGNOSIS — H2513 Age-related nuclear cataract, bilateral: Secondary | ICD-10-CM | POA: Diagnosis not present

## 2020-07-11 DIAGNOSIS — E119 Type 2 diabetes mellitus without complications: Secondary | ICD-10-CM | POA: Diagnosis not present

## 2020-07-11 DIAGNOSIS — H2513 Age-related nuclear cataract, bilateral: Secondary | ICD-10-CM | POA: Diagnosis not present

## 2020-07-11 DIAGNOSIS — H16142 Punctate keratitis, left eye: Secondary | ICD-10-CM | POA: Diagnosis not present

## 2020-07-11 DIAGNOSIS — Z7984 Long term (current) use of oral hypoglycemic drugs: Secondary | ICD-10-CM | POA: Diagnosis not present

## 2020-07-11 DIAGNOSIS — H02155 Paralytic ectropion of left lower eyelid: Secondary | ICD-10-CM | POA: Diagnosis not present

## 2020-07-11 DIAGNOSIS — H35373 Puckering of macula, bilateral: Secondary | ICD-10-CM | POA: Diagnosis not present

## 2020-07-11 DIAGNOSIS — H02055 Trichiasis without entropian left lower eyelid: Secondary | ICD-10-CM | POA: Diagnosis not present

## 2020-07-11 DIAGNOSIS — H4322 Crystalline deposits in vitreous body, left eye: Secondary | ICD-10-CM | POA: Diagnosis not present

## 2020-07-11 DIAGNOSIS — H401131 Primary open-angle glaucoma, bilateral, mild stage: Secondary | ICD-10-CM | POA: Diagnosis not present

## 2020-07-11 DIAGNOSIS — H10233 Serous conjunctivitis, except viral, bilateral: Secondary | ICD-10-CM | POA: Diagnosis not present

## 2020-07-11 DIAGNOSIS — H43813 Vitreous degeneration, bilateral: Secondary | ICD-10-CM | POA: Diagnosis not present

## 2020-08-04 DIAGNOSIS — H02155 Paralytic ectropion of left lower eyelid: Secondary | ICD-10-CM | POA: Diagnosis not present

## 2020-08-04 DIAGNOSIS — H43813 Vitreous degeneration, bilateral: Secondary | ICD-10-CM | POA: Diagnosis not present

## 2020-08-04 DIAGNOSIS — H35373 Puckering of macula, bilateral: Secondary | ICD-10-CM | POA: Diagnosis not present

## 2020-08-04 DIAGNOSIS — H2513 Age-related nuclear cataract, bilateral: Secondary | ICD-10-CM | POA: Diagnosis not present

## 2020-08-04 DIAGNOSIS — H401131 Primary open-angle glaucoma, bilateral, mild stage: Secondary | ICD-10-CM | POA: Diagnosis not present

## 2020-08-04 DIAGNOSIS — H16142 Punctate keratitis, left eye: Secondary | ICD-10-CM | POA: Diagnosis not present

## 2020-08-04 DIAGNOSIS — E119 Type 2 diabetes mellitus without complications: Secondary | ICD-10-CM | POA: Diagnosis not present

## 2020-08-04 DIAGNOSIS — H10233 Serous conjunctivitis, except viral, bilateral: Secondary | ICD-10-CM | POA: Diagnosis not present

## 2020-08-04 DIAGNOSIS — H02055 Trichiasis without entropian left lower eyelid: Secondary | ICD-10-CM | POA: Diagnosis not present

## 2020-08-04 DIAGNOSIS — H4322 Crystalline deposits in vitreous body, left eye: Secondary | ICD-10-CM | POA: Diagnosis not present

## 2020-08-04 DIAGNOSIS — Z7984 Long term (current) use of oral hypoglycemic drugs: Secondary | ICD-10-CM | POA: Diagnosis not present

## 2020-08-22 DIAGNOSIS — E118 Type 2 diabetes mellitus with unspecified complications: Secondary | ICD-10-CM | POA: Diagnosis not present

## 2020-08-22 DIAGNOSIS — Z79899 Other long term (current) drug therapy: Secondary | ICD-10-CM | POA: Diagnosis not present

## 2020-08-22 DIAGNOSIS — E538 Deficiency of other specified B group vitamins: Secondary | ICD-10-CM | POA: Diagnosis not present

## 2020-08-22 DIAGNOSIS — Z125 Encounter for screening for malignant neoplasm of prostate: Secondary | ICD-10-CM | POA: Diagnosis not present

## 2020-08-22 DIAGNOSIS — E782 Mixed hyperlipidemia: Secondary | ICD-10-CM | POA: Diagnosis not present

## 2020-08-22 DIAGNOSIS — I10 Essential (primary) hypertension: Secondary | ICD-10-CM | POA: Diagnosis not present

## 2020-08-25 DIAGNOSIS — G629 Polyneuropathy, unspecified: Secondary | ICD-10-CM | POA: Diagnosis not present

## 2020-08-25 DIAGNOSIS — E538 Deficiency of other specified B group vitamins: Secondary | ICD-10-CM | POA: Diagnosis not present

## 2020-08-29 DIAGNOSIS — E118 Type 2 diabetes mellitus with unspecified complications: Secondary | ICD-10-CM | POA: Diagnosis not present

## 2020-08-29 DIAGNOSIS — E782 Mixed hyperlipidemia: Secondary | ICD-10-CM | POA: Diagnosis not present

## 2020-08-29 DIAGNOSIS — I10 Essential (primary) hypertension: Secondary | ICD-10-CM | POA: Diagnosis not present

## 2020-08-29 DIAGNOSIS — I2510 Atherosclerotic heart disease of native coronary artery without angina pectoris: Secondary | ICD-10-CM | POA: Diagnosis not present

## 2020-08-29 DIAGNOSIS — Z85828 Personal history of other malignant neoplasm of skin: Secondary | ICD-10-CM | POA: Diagnosis not present

## 2020-09-02 DIAGNOSIS — Z7984 Long term (current) use of oral hypoglycemic drugs: Secondary | ICD-10-CM | POA: Diagnosis not present

## 2020-09-02 DIAGNOSIS — H43813 Vitreous degeneration, bilateral: Secondary | ICD-10-CM | POA: Diagnosis not present

## 2020-09-02 DIAGNOSIS — H02155 Paralytic ectropion of left lower eyelid: Secondary | ICD-10-CM | POA: Diagnosis not present

## 2020-09-02 DIAGNOSIS — H2513 Age-related nuclear cataract, bilateral: Secondary | ICD-10-CM | POA: Diagnosis not present

## 2020-09-02 DIAGNOSIS — E119 Type 2 diabetes mellitus without complications: Secondary | ICD-10-CM | POA: Diagnosis not present

## 2020-09-02 DIAGNOSIS — H02055 Trichiasis without entropian left lower eyelid: Secondary | ICD-10-CM | POA: Diagnosis not present

## 2020-09-02 DIAGNOSIS — H4322 Crystalline deposits in vitreous body, left eye: Secondary | ICD-10-CM | POA: Diagnosis not present

## 2020-09-02 DIAGNOSIS — H10233 Serous conjunctivitis, except viral, bilateral: Secondary | ICD-10-CM | POA: Diagnosis not present

## 2020-09-02 DIAGNOSIS — H401131 Primary open-angle glaucoma, bilateral, mild stage: Secondary | ICD-10-CM | POA: Diagnosis not present

## 2020-09-02 DIAGNOSIS — H16142 Punctate keratitis, left eye: Secondary | ICD-10-CM | POA: Diagnosis not present

## 2020-09-02 DIAGNOSIS — H35373 Puckering of macula, bilateral: Secondary | ICD-10-CM | POA: Diagnosis not present

## 2020-09-08 DIAGNOSIS — L570 Actinic keratosis: Secondary | ICD-10-CM | POA: Diagnosis not present

## 2020-09-08 DIAGNOSIS — D225 Melanocytic nevi of trunk: Secondary | ICD-10-CM | POA: Diagnosis not present

## 2020-09-08 DIAGNOSIS — C44329 Squamous cell carcinoma of skin of other parts of face: Secondary | ICD-10-CM | POA: Diagnosis not present

## 2020-09-08 DIAGNOSIS — L821 Other seborrheic keratosis: Secondary | ICD-10-CM | POA: Diagnosis not present

## 2020-09-08 DIAGNOSIS — D1801 Hemangioma of skin and subcutaneous tissue: Secondary | ICD-10-CM | POA: Diagnosis not present

## 2020-09-08 DIAGNOSIS — D2261 Melanocytic nevi of right upper limb, including shoulder: Secondary | ICD-10-CM | POA: Diagnosis not present

## 2020-09-08 DIAGNOSIS — Z85828 Personal history of other malignant neoplasm of skin: Secondary | ICD-10-CM | POA: Diagnosis not present

## 2020-09-08 DIAGNOSIS — C44622 Squamous cell carcinoma of skin of right upper limb, including shoulder: Secondary | ICD-10-CM | POA: Diagnosis not present

## 2020-10-03 DIAGNOSIS — E782 Mixed hyperlipidemia: Secondary | ICD-10-CM | POA: Diagnosis not present

## 2020-10-03 DIAGNOSIS — I2510 Atherosclerotic heart disease of native coronary artery without angina pectoris: Secondary | ICD-10-CM | POA: Diagnosis not present

## 2020-10-03 DIAGNOSIS — E118 Type 2 diabetes mellitus with unspecified complications: Secondary | ICD-10-CM | POA: Diagnosis not present

## 2020-10-03 DIAGNOSIS — I10 Essential (primary) hypertension: Secondary | ICD-10-CM | POA: Diagnosis not present

## 2020-10-13 DIAGNOSIS — E119 Type 2 diabetes mellitus without complications: Secondary | ICD-10-CM | POA: Diagnosis not present

## 2020-10-13 DIAGNOSIS — H401131 Primary open-angle glaucoma, bilateral, mild stage: Secondary | ICD-10-CM | POA: Diagnosis not present

## 2020-10-13 DIAGNOSIS — H10233 Serous conjunctivitis, except viral, bilateral: Secondary | ICD-10-CM | POA: Diagnosis not present

## 2020-10-13 DIAGNOSIS — H02155 Paralytic ectropion of left lower eyelid: Secondary | ICD-10-CM | POA: Diagnosis not present

## 2020-10-13 DIAGNOSIS — H4322 Crystalline deposits in vitreous body, left eye: Secondary | ICD-10-CM | POA: Diagnosis not present

## 2020-10-13 DIAGNOSIS — H35373 Puckering of macula, bilateral: Secondary | ICD-10-CM | POA: Diagnosis not present

## 2020-10-13 DIAGNOSIS — Z7984 Long term (current) use of oral hypoglycemic drugs: Secondary | ICD-10-CM | POA: Diagnosis not present

## 2020-10-13 DIAGNOSIS — H2513 Age-related nuclear cataract, bilateral: Secondary | ICD-10-CM | POA: Diagnosis not present

## 2020-10-13 DIAGNOSIS — H16142 Punctate keratitis, left eye: Secondary | ICD-10-CM | POA: Diagnosis not present

## 2020-10-13 DIAGNOSIS — H43813 Vitreous degeneration, bilateral: Secondary | ICD-10-CM | POA: Diagnosis not present

## 2020-10-13 DIAGNOSIS — H02055 Trichiasis without entropian left lower eyelid: Secondary | ICD-10-CM | POA: Diagnosis not present

## 2020-11-15 DIAGNOSIS — Z7984 Long term (current) use of oral hypoglycemic drugs: Secondary | ICD-10-CM | POA: Diagnosis not present

## 2020-11-15 DIAGNOSIS — H43813 Vitreous degeneration, bilateral: Secondary | ICD-10-CM | POA: Diagnosis not present

## 2020-11-15 DIAGNOSIS — H02155 Paralytic ectropion of left lower eyelid: Secondary | ICD-10-CM | POA: Diagnosis not present

## 2020-11-15 DIAGNOSIS — H10233 Serous conjunctivitis, except viral, bilateral: Secondary | ICD-10-CM | POA: Diagnosis not present

## 2020-11-15 DIAGNOSIS — H16142 Punctate keratitis, left eye: Secondary | ICD-10-CM | POA: Diagnosis not present

## 2020-11-15 DIAGNOSIS — H2513 Age-related nuclear cataract, bilateral: Secondary | ICD-10-CM | POA: Diagnosis not present

## 2020-11-15 DIAGNOSIS — E119 Type 2 diabetes mellitus without complications: Secondary | ICD-10-CM | POA: Diagnosis not present

## 2020-11-15 DIAGNOSIS — H02055 Trichiasis without entropian left lower eyelid: Secondary | ICD-10-CM | POA: Diagnosis not present

## 2020-11-15 DIAGNOSIS — H35373 Puckering of macula, bilateral: Secondary | ICD-10-CM | POA: Diagnosis not present

## 2020-11-15 DIAGNOSIS — H401131 Primary open-angle glaucoma, bilateral, mild stage: Secondary | ICD-10-CM | POA: Diagnosis not present

## 2020-11-15 DIAGNOSIS — H4322 Crystalline deposits in vitreous body, left eye: Secondary | ICD-10-CM | POA: Diagnosis not present

## 2020-12-06 DIAGNOSIS — Z0000 Encounter for general adult medical examination without abnormal findings: Secondary | ICD-10-CM | POA: Diagnosis not present

## 2020-12-06 DIAGNOSIS — E118 Type 2 diabetes mellitus with unspecified complications: Secondary | ICD-10-CM | POA: Diagnosis not present

## 2020-12-06 DIAGNOSIS — I2510 Atherosclerotic heart disease of native coronary artery without angina pectoris: Secondary | ICD-10-CM | POA: Diagnosis not present

## 2020-12-06 DIAGNOSIS — I10 Essential (primary) hypertension: Secondary | ICD-10-CM | POA: Diagnosis not present

## 2020-12-06 DIAGNOSIS — E782 Mixed hyperlipidemia: Secondary | ICD-10-CM | POA: Diagnosis not present

## 2020-12-06 DIAGNOSIS — Z79899 Other long term (current) drug therapy: Secondary | ICD-10-CM | POA: Diagnosis not present

## 2020-12-06 DIAGNOSIS — M79645 Pain in left finger(s): Secondary | ICD-10-CM | POA: Diagnosis not present

## 2020-12-09 DIAGNOSIS — L821 Other seborrheic keratosis: Secondary | ICD-10-CM | POA: Diagnosis not present

## 2020-12-09 DIAGNOSIS — L905 Scar conditions and fibrosis of skin: Secondary | ICD-10-CM | POA: Diagnosis not present

## 2020-12-09 DIAGNOSIS — L814 Other melanin hyperpigmentation: Secondary | ICD-10-CM | POA: Diagnosis not present

## 2020-12-09 DIAGNOSIS — D1801 Hemangioma of skin and subcutaneous tissue: Secondary | ICD-10-CM | POA: Diagnosis not present

## 2020-12-09 DIAGNOSIS — D485 Neoplasm of uncertain behavior of skin: Secondary | ICD-10-CM | POA: Diagnosis not present

## 2020-12-09 DIAGNOSIS — Z85828 Personal history of other malignant neoplasm of skin: Secondary | ICD-10-CM | POA: Diagnosis not present

## 2020-12-09 DIAGNOSIS — L570 Actinic keratosis: Secondary | ICD-10-CM | POA: Diagnosis not present

## 2020-12-15 DIAGNOSIS — M189 Osteoarthritis of first carpometacarpal joint, unspecified: Secondary | ICD-10-CM | POA: Diagnosis not present

## 2020-12-28 DIAGNOSIS — Z7984 Long term (current) use of oral hypoglycemic drugs: Secondary | ICD-10-CM | POA: Diagnosis not present

## 2020-12-28 DIAGNOSIS — H10233 Serous conjunctivitis, except viral, bilateral: Secondary | ICD-10-CM | POA: Diagnosis not present

## 2020-12-28 DIAGNOSIS — H401131 Primary open-angle glaucoma, bilateral, mild stage: Secondary | ICD-10-CM | POA: Diagnosis not present

## 2020-12-28 DIAGNOSIS — H35373 Puckering of macula, bilateral: Secondary | ICD-10-CM | POA: Diagnosis not present

## 2020-12-28 DIAGNOSIS — H02055 Trichiasis without entropian left lower eyelid: Secondary | ICD-10-CM | POA: Diagnosis not present

## 2020-12-28 DIAGNOSIS — H02155 Paralytic ectropion of left lower eyelid: Secondary | ICD-10-CM | POA: Diagnosis not present

## 2020-12-28 DIAGNOSIS — E119 Type 2 diabetes mellitus without complications: Secondary | ICD-10-CM | POA: Diagnosis not present

## 2020-12-28 DIAGNOSIS — H2513 Age-related nuclear cataract, bilateral: Secondary | ICD-10-CM | POA: Diagnosis not present

## 2020-12-28 DIAGNOSIS — H43813 Vitreous degeneration, bilateral: Secondary | ICD-10-CM | POA: Diagnosis not present

## 2020-12-28 DIAGNOSIS — H4322 Crystalline deposits in vitreous body, left eye: Secondary | ICD-10-CM | POA: Diagnosis not present

## 2020-12-28 DIAGNOSIS — H16142 Punctate keratitis, left eye: Secondary | ICD-10-CM | POA: Diagnosis not present

## 2021-01-06 DIAGNOSIS — M952 Other acquired deformity of head: Secondary | ICD-10-CM | POA: Diagnosis not present

## 2021-01-06 DIAGNOSIS — H02234 Paralytic lagophthalmos left upper eyelid: Secondary | ICD-10-CM | POA: Diagnosis not present

## 2021-01-06 DIAGNOSIS — Z8589 Personal history of malignant neoplasm of other organs and systems: Secondary | ICD-10-CM | POA: Diagnosis not present

## 2021-01-06 DIAGNOSIS — G510 Bell's palsy: Secondary | ICD-10-CM | POA: Diagnosis not present

## 2021-01-06 DIAGNOSIS — H02106 Unspecified ectropion of left eye, unspecified eyelid: Secondary | ICD-10-CM | POA: Diagnosis not present

## 2021-01-06 DIAGNOSIS — D492 Neoplasm of unspecified behavior of bone, soft tissue, and skin: Secondary | ICD-10-CM | POA: Diagnosis not present

## 2021-01-23 ENCOUNTER — Other Ambulatory Visit: Payer: Self-pay

## 2021-01-23 ENCOUNTER — Emergency Department: Payer: PPO

## 2021-01-23 ENCOUNTER — Emergency Department
Admission: EM | Admit: 2021-01-23 | Discharge: 2021-01-23 | Disposition: A | Discharge status: Home / Self Care | Payer: PPO | Attending: Emergency Medicine | Admitting: Emergency Medicine

## 2021-01-23 DIAGNOSIS — M25561 Pain in right knee: Secondary | ICD-10-CM | POA: Diagnosis not present

## 2021-01-23 DIAGNOSIS — I10 Essential (primary) hypertension: Secondary | ICD-10-CM | POA: Diagnosis not present

## 2021-01-23 DIAGNOSIS — I2510 Atherosclerotic heart disease of native coronary artery without angina pectoris: Secondary | ICD-10-CM | POA: Diagnosis not present

## 2021-01-23 DIAGNOSIS — S99911A Unspecified injury of right ankle, initial encounter: Secondary | ICD-10-CM | POA: Diagnosis present

## 2021-01-23 DIAGNOSIS — Z7982 Long term (current) use of aspirin: Secondary | ICD-10-CM | POA: Insufficient documentation

## 2021-01-23 DIAGNOSIS — Z85828 Personal history of other malignant neoplasm of skin: Secondary | ICD-10-CM | POA: Insufficient documentation

## 2021-01-23 DIAGNOSIS — Z85038 Personal history of other malignant neoplasm of large intestine: Secondary | ICD-10-CM | POA: Diagnosis not present

## 2021-01-23 DIAGNOSIS — W010XXA Fall on same level from slipping, tripping and stumbling without subsequent striking against object, initial encounter: Secondary | ICD-10-CM | POA: Diagnosis not present

## 2021-01-23 DIAGNOSIS — M7989 Other specified soft tissue disorders: Secondary | ICD-10-CM | POA: Diagnosis not present

## 2021-01-23 DIAGNOSIS — Z79899 Other long term (current) drug therapy: Secondary | ICD-10-CM | POA: Insufficient documentation

## 2021-01-23 DIAGNOSIS — Y9301 Activity, walking, marching and hiking: Secondary | ICD-10-CM | POA: Diagnosis not present

## 2021-01-23 DIAGNOSIS — Z87891 Personal history of nicotine dependence: Secondary | ICD-10-CM | POA: Insufficient documentation

## 2021-01-23 DIAGNOSIS — S8261XA Displaced fracture of lateral malleolus of right fibula, initial encounter for closed fracture: Secondary | ICD-10-CM | POA: Diagnosis not present

## 2021-01-23 DIAGNOSIS — R600 Localized edema: Secondary | ICD-10-CM | POA: Diagnosis not present

## 2021-01-23 NOTE — ED Triage Notes (Signed)
Pt has issues with his balance since having CA and surgery that removed his left ear drum, states he lost his balance 2 days ago and is having right foot and knee pain.

## 2021-01-23 NOTE — ED Provider Notes (Signed)
Patient suffers from general weakness and balance issues which impairs their ability to perform daily activities in the home. A cane will not resolve issue with performing activities of daily living. A wheelchair will allow patient to safely perform daily activities. Patient is not able to propel themselves in the home using a standard weight wheelchair due to general weakness.  Patient can self-propel in the lightweight wheelchair.       Rada Hay, MD 01/23/21 [phone]

## 2021-01-23 NOTE — Progress Notes (Cosign Needed)
  Patient suffers from general weakness and balance issues which impairs their ability to perform daily activities in the home. A cane will not resolve issue with performing activities of daily living. A wheelchair will allow patient to safely perform daily activities. Patient is not able to propel themselves in the home using a standard weight wheelchair due to general weakness.  Patient can self-propel in the lightweight wheelchair.

## 2021-01-23 NOTE — Discharge Instructions (Signed)
You have a broken bone in your right ankle.  Please follow-up with the orthopedic doctors in about 1 week.  Please follow-up with your primary care provider as you would likely benefit from physical therapy.

## 2021-01-23 NOTE — ED Provider Notes (Addendum)
[facility]  ____________________________________________   Event Date/Time   First MD Initiated Contact with Patient 01/23/21 1107     (approximate)  I have reviewed the triage vital signs and the nursing notes.   HISTORY  Chief Complaint Fall    HPI Brandon Clements is a 80 y.o. male with past medical history of squamous cell cancer of the jaw status post resection, coronary artery disease, GERD, hyperlipidemia, hypertension who presents with right foot pain and knee pain after fall.  About 2 nights ago patient had a mechanical fall.  He was walking in the dark when he tripped over a chair and twisted the right leg.  He did not hit his head.  He has had increasing pain of the left foot since that time is unable to bear weight.  He also has some associated right knee pain.  Denies numbness or weakness.  Denies headache.  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   10/2010, squamous cell cancer  right jaw 2012     Colon polyp    adenomatous   GERD (gastroesophageal reflux disease)    Hiatal hernia    Hx of radiation therapy 2011   left cheek, [facility]   Hx of radiation therapy 12/30/13-01/26/14   scalp 5000 cGy in 20 sessions   Hyperlipidemia    Hypertension    Kidney stones    Myocardial infarction [facility]) 2005   "mild"   Skin cancer 11/2013   right frontal scalp, squamous cell carcinoma, history basal cell ca   Status post dilation of esophageal narrowing     Patient Active Problem List   Diagnosis Date Noted   Benign neoplasm of ascending colon    History of colon polyps    Benign neoplasm  of transverse colon    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 Henrene Pastor   COLONOSCOPY N/A 12/07/2014   Procedure: COLONOSCOPY;  Surgeon: Irene Shipper, MD;  [facility]: [facility];  Service: Endoscopy;  Laterality: N/A;   COLONOSCOPY WITH PROPOFOL N/A 12/22/2019   Procedure: COLONOSCOPY WITH PROPOFOL;  Surgeon: Irene Shipper, MD;  [facility];  Service: Endoscopy;  Laterality: N/A;   ESOPHAGOGASTRODUODENOSCOPY N/A 12/07/2014   Procedure: ESOPHAGOGASTRODUODENOSCOPY (EGD);  Surgeon: Irene Shipper, MD;  Location: [facility];  Service: Endoscopy;  Laterality: N/A;   ESOPHAGOGASTRODUODENOSCOPY (EGD)  WITH PROPOFOL N/A 12/22/2019   Procedure: ESOPHAGOGASTRODUODENOSCOPY (EGD) WITH PROPOFOL;  Surgeon: Irene Shipper, MD;  [facility]: [facility];  Service: Endoscopy;  Laterality: N/A;   EXTERNAL EAR SURGERY Left    left ear removal   NECK DISSECTION  Feb 2012   right   POLYPECTOMY  12/22/2019   Procedure: POLYPECTOMY;  Surgeon: Irene Shipper, MD;  [facility]: [facility];  Service: Endoscopy;;   RADICAL NECK DISSECTION  July 2012   left neck, jaw, and ear    Prior to Admission medications   Medication Sig Start Date End Date Taking? Authorizing Provider  acetaminophen (TYLENOL) 500 MG tablet Take 500-1,000 mg by mouth every 8 (eight) hours as needed for moderate pain.    [provider]  aspirin 81  MG tablet Take 81 mg by mouth at bedtime.     [provider]  fluticasone (FLONASE) 50 MCG/ACT nasal spray Place 1 spray into the nose daily as needed for allergies or rhinitis.     [provider]  glimepiride (AMARYL) 2 MG tablet Take 2 mg by mouth daily with breakfast.  10/26/19   [provider]  metoprolol tartrate (LOPRESSOR) 25 MG tablet Take 25 mg by mouth 2 (two) times daily.  11/02/11   [provider]  omeprazole (PRILOSEC) 40 MG capsule Take 40 mg by mouth at bedtime.    [provider]  simvastatin (ZOCOR) 20 MG tablet Take 20 mg by mouth every evening.  11/02/11   [provider]  triamcinolone cream (KENALOG) 0.5 % Apply 1 application topically 2 (two) times daily as needed (cancerous spots).     [provider]    Allergies Patient has no known allergies.  Family History  Problem Relation Age of Onset   Skin cancer Father    Bladder Cancer Father    Alzheimer's disease Mother    Colon cancer Neg Hx    Stomach cancer Neg Hx    Colon polyps Neg Hx    Esophageal cancer Neg Hx    Kidney disease Neg Hx    Gallbladder disease Neg Hx    Diabetes Neg Hx     Social History Social History   Tobacco Use   Smoking status: Former   Smokeless tobacco: Current    Types: Nurse, children's Use: Never used  Substance Use Topics   Alcohol use: Yes    Alcohol/week: 14.0 standard drinks    Types: 14 Cans of beer per week    Comment: occassionally   Drug use: No    Review of Systems   Review of Systems  Musculoskeletal:  Positive for arthralgias, gait problem and joint swelling.  Neurological:  Negative for weakness and numbness.  All other systems reviewed and are negative.  Physical Exam Updated Vital Signs BP 115/68   Pulse 66   Temp 98.2 F (36.8 C) (Oral)   Resp 18   Ht 5\' 9"  (1.753 m)   Wt 89.4 kg   SpO2 99%   BMI 29.11 kg/m   Physical Exam Vitals and nursing note reviewed.   Constitutional:      General: He is not in acute distress.    Appearance: Normal appearance.  HENT:     Head: Normocephalic and atraumatic.  Eyes:     General: No scleral icterus.    Conjunctiva/sclera: Conjunctivae normal.  Pulmonary:     Effort: Pulmonary effort is normal. No respiratory distress.     Breath sounds: Normal  breath sounds. No wheezing.  Musculoskeletal:        General: Signs of injury present. No deformity.     Cervical back: Normal range of motion.     Comments: Right knee mildly swollen, normal range of motion, normal straight leg raise, no erythema or warmth, no tenderness to palpation over the patella or medial or lateral joint line  Foot with swelling and ecchymosis over the dorsum, there is no plantar ecchymosis, he has tenderness to palpation over the metatarsals distally, as well as the proximal fifth metatarsal, foot is warm well perfused, no tenderness over the lateral or medial malleoli   Skin:    Coloration: Skin is not jaundiced or pale.  Neurological:     General: No focal deficit present.     Mental Status: He is alert and oriented to person, place, and time. Mental status is at baseline.  Psychiatric:        Mood and Affect: Mood normal.        Behavior: Behavior normal.     LABS (all labs ordered are listed, but only abnormal results are displayed)  Labs Reviewed - No data to display ____________________________________________  EKG  N/a ____________________________________________  RADIOLOGY Almeta Monas, personally viewed and evaluated these images (plain radiographs) as part of my medical decision making, as well as reviewing the written report by the radiologist.  ED MD interpretation: I reviewed the x-ray of the right ankle which shows an avulsion fracture of the lateral malleolus  Of the right foot which does not show any acute fracture dislocation  Reviewed the x-ray of the right knee which does not show any acute  fracture dislocation    ____________________________________________   PROCEDURES  Procedure(s) performed (including Critical Care):  Procedures   ____________________________________________   INITIAL IMPRESSION / ASSESSMENT AND PLAN / ED COURSE     Patient is a 80 year old male presenting with foot pain after fall 2 nights ago.  He had a mechanical fall and twisted the leg and foot.  Did not hit his head.  On exam he has ecchymosis on the dorsum of the right foot with some swelling.  Tenderness diffusely over the distal metatarsals as well as over the proximal fifth metatarsal.  Really no ankle tenderness.  He also has some pain in the right knee but the knee is nonswollen with normal range of motion and no focal bony tenderness.  Will obtain x-rays of the right knee ankle and foot.  X-ray of the right ankle does show a avulsion fracture of the lateral malleolus.  No foot fracture no new fracture.  Patient was placed in a posterior short leg splint.  We attempted to trial a walker and crutches however patient had significant occultly secondary to his underlying unsteadiness and inability to bear weight on that right leg.  We will arrange for the patient to have a wheelchair for discharge.  I discussed with the patient's wife that he may benefit eventually from rehab given his underlying difficulty ambulating.  She will discuss this with the primary care provider.  Clinical Course as of 01/23/21 1454  Mon Jan 23, 2021  1246  IMPRESSION: 1. Minimally displaced avulsion fracture of the tip of the lateral malleolus with mild overlying soft tissue swelling. 2. No ankle joint dislocation. 3. No fracture or dislocation in the right foot.   [KM]    Clinical Course User Index [KM] Rada Hay, MD     ____________________________________________   FINAL CLINICAL IMPRESSION(S) /  ED DIAGNOSES  Final diagnoses:  Closed low lateral malleolus fracture, right, initial  encounter     ED Discharge Orders     None        Note:  This document was prepared using Dragon voice recognition software and may include unintentional dictation errors.    Rada Hay, MD 01/23/21 1410    Rada Hay, MD 01/23/21 [phone]

## 2021-01-26 DIAGNOSIS — M79671 Pain in right foot: Secondary | ICD-10-CM | POA: Diagnosis not present

## 2021-01-26 DIAGNOSIS — M25561 Pain in right knee: Secondary | ICD-10-CM | POA: Diagnosis not present

## 2021-01-26 DIAGNOSIS — S8261XA Displaced fracture of lateral malleolus of right fibula, initial encounter for closed fracture: Secondary | ICD-10-CM | POA: Diagnosis not present

## 2021-02-01 DIAGNOSIS — Z8781 Personal history of (healed) traumatic fracture: Secondary | ICD-10-CM | POA: Diagnosis not present

## 2021-02-01 DIAGNOSIS — I2510 Atherosclerotic heart disease of native coronary artery without angina pectoris: Secondary | ICD-10-CM | POA: Diagnosis not present

## 2021-02-01 DIAGNOSIS — E118 Type 2 diabetes mellitus with unspecified complications: Secondary | ICD-10-CM | POA: Diagnosis not present

## 2021-02-01 DIAGNOSIS — I10 Essential (primary) hypertension: Secondary | ICD-10-CM | POA: Diagnosis not present

## 2021-02-17 DIAGNOSIS — M25561 Pain in right knee: Secondary | ICD-10-CM | POA: Diagnosis not present

## 2021-02-17 DIAGNOSIS — S8261XA Displaced fracture of lateral malleolus of right fibula, initial encounter for closed fracture: Secondary | ICD-10-CM | POA: Diagnosis not present

## 2021-03-10 DIAGNOSIS — D044 Carcinoma in situ of skin of scalp and neck: Secondary | ICD-10-CM | POA: Diagnosis not present

## 2021-03-10 DIAGNOSIS — Z85828 Personal history of other malignant neoplasm of skin: Secondary | ICD-10-CM | POA: Diagnosis not present

## 2021-03-10 DIAGNOSIS — L821 Other seborrheic keratosis: Secondary | ICD-10-CM | POA: Diagnosis not present

## 2021-03-10 DIAGNOSIS — L814 Other melanin hyperpigmentation: Secondary | ICD-10-CM | POA: Diagnosis not present

## 2021-03-10 DIAGNOSIS — L570 Actinic keratosis: Secondary | ICD-10-CM | POA: Diagnosis not present

## 2021-03-10 DIAGNOSIS — C4442 Squamous cell carcinoma of skin of scalp and neck: Secondary | ICD-10-CM | POA: Diagnosis not present

## 2021-03-24 DIAGNOSIS — Z85828 Personal history of other malignant neoplasm of skin: Secondary | ICD-10-CM | POA: Diagnosis not present

## 2021-03-24 DIAGNOSIS — D044 Carcinoma in situ of skin of scalp and neck: Secondary | ICD-10-CM | POA: Diagnosis not present

## 2021-03-31 DIAGNOSIS — H16142 Punctate keratitis, left eye: Secondary | ICD-10-CM | POA: Diagnosis not present

## 2021-03-31 DIAGNOSIS — H401131 Primary open-angle glaucoma, bilateral, mild stage: Secondary | ICD-10-CM | POA: Diagnosis not present

## 2021-03-31 DIAGNOSIS — H02055 Trichiasis without entropian left lower eyelid: Secondary | ICD-10-CM | POA: Diagnosis not present

## 2021-03-31 DIAGNOSIS — H43813 Vitreous degeneration, bilateral: Secondary | ICD-10-CM | POA: Diagnosis not present

## 2021-03-31 DIAGNOSIS — H2513 Age-related nuclear cataract, bilateral: Secondary | ICD-10-CM | POA: Diagnosis not present

## 2021-03-31 DIAGNOSIS — H10233 Serous conjunctivitis, except viral, bilateral: Secondary | ICD-10-CM | POA: Diagnosis not present

## 2021-03-31 DIAGNOSIS — Z7984 Long term (current) use of oral hypoglycemic drugs: Secondary | ICD-10-CM | POA: Diagnosis not present

## 2021-03-31 DIAGNOSIS — E119 Type 2 diabetes mellitus without complications: Secondary | ICD-10-CM | POA: Diagnosis not present

## 2021-03-31 DIAGNOSIS — H02155 Paralytic ectropion of left lower eyelid: Secondary | ICD-10-CM | POA: Diagnosis not present

## 2021-03-31 DIAGNOSIS — H35373 Puckering of macula, bilateral: Secondary | ICD-10-CM | POA: Diagnosis not present

## 2021-03-31 DIAGNOSIS — H4322 Crystalline deposits in vitreous body, left eye: Secondary | ICD-10-CM | POA: Diagnosis not present

## 2021-04-05 DIAGNOSIS — E782 Mixed hyperlipidemia: Secondary | ICD-10-CM | POA: Diagnosis not present

## 2021-04-05 DIAGNOSIS — I2510 Atherosclerotic heart disease of native coronary artery without angina pectoris: Secondary | ICD-10-CM | POA: Diagnosis not present

## 2021-04-05 DIAGNOSIS — I10 Essential (primary) hypertension: Secondary | ICD-10-CM | POA: Diagnosis not present

## 2021-04-12 DIAGNOSIS — Z79899 Other long term (current) drug therapy: Secondary | ICD-10-CM | POA: Diagnosis not present

## 2021-04-12 DIAGNOSIS — I2510 Atherosclerotic heart disease of native coronary artery without angina pectoris: Secondary | ICD-10-CM | POA: Diagnosis not present

## 2021-04-12 DIAGNOSIS — E118 Type 2 diabetes mellitus with unspecified complications: Secondary | ICD-10-CM | POA: Diagnosis not present

## 2021-04-12 DIAGNOSIS — E782 Mixed hyperlipidemia: Secondary | ICD-10-CM | POA: Diagnosis not present

## 2021-04-12 DIAGNOSIS — I10 Essential (primary) hypertension: Secondary | ICD-10-CM | POA: Diagnosis not present

## 2021-04-12 DIAGNOSIS — Z0000 Encounter for general adult medical examination without abnormal findings: Secondary | ICD-10-CM | POA: Diagnosis not present

## 2021-04-12 DIAGNOSIS — E538 Deficiency of other specified B group vitamins: Secondary | ICD-10-CM | POA: Diagnosis not present

## 2021-04-13 DIAGNOSIS — E538 Deficiency of other specified B group vitamins: Secondary | ICD-10-CM | POA: Diagnosis not present

## 2021-04-20 DIAGNOSIS — E538 Deficiency of other specified B group vitamins: Secondary | ICD-10-CM | POA: Diagnosis not present

## 2021-04-27 DIAGNOSIS — E538 Deficiency of other specified B group vitamins: Secondary | ICD-10-CM | POA: Diagnosis not present

## 2021-05-09 DIAGNOSIS — E538 Deficiency of other specified B group vitamins: Secondary | ICD-10-CM | POA: Diagnosis not present

## 2021-05-19 DIAGNOSIS — H2513 Age-related nuclear cataract, bilateral: Secondary | ICD-10-CM | POA: Diagnosis not present

## 2021-05-19 DIAGNOSIS — H2512 Age-related nuclear cataract, left eye: Secondary | ICD-10-CM | POA: Diagnosis not present

## 2021-05-19 DIAGNOSIS — H40013 Open angle with borderline findings, low risk, bilateral: Secondary | ICD-10-CM | POA: Diagnosis not present

## 2021-05-19 DIAGNOSIS — H35373 Puckering of macula, bilateral: Secondary | ICD-10-CM | POA: Diagnosis not present

## 2021-06-09 DIAGNOSIS — E538 Deficiency of other specified B group vitamins: Secondary | ICD-10-CM | POA: Diagnosis not present

## 2021-06-15 DIAGNOSIS — D1801 Hemangioma of skin and subcutaneous tissue: Secondary | ICD-10-CM | POA: Diagnosis not present

## 2021-06-15 DIAGNOSIS — L821 Other seborrheic keratosis: Secondary | ICD-10-CM | POA: Diagnosis not present

## 2021-06-15 DIAGNOSIS — L570 Actinic keratosis: Secondary | ICD-10-CM | POA: Diagnosis not present

## 2021-06-15 DIAGNOSIS — L814 Other melanin hyperpigmentation: Secondary | ICD-10-CM | POA: Diagnosis not present

## 2021-06-15 DIAGNOSIS — D044 Carcinoma in situ of skin of scalp and neck: Secondary | ICD-10-CM | POA: Diagnosis not present

## 2021-06-15 DIAGNOSIS — Z85828 Personal history of other malignant neoplasm of skin: Secondary | ICD-10-CM | POA: Diagnosis not present

## 2021-06-15 DIAGNOSIS — C4442 Squamous cell carcinoma of skin of scalp and neck: Secondary | ICD-10-CM | POA: Diagnosis not present

## 2021-07-05 DIAGNOSIS — E538 Deficiency of other specified B group vitamins: Secondary | ICD-10-CM | POA: Diagnosis not present

## 2021-07-05 DIAGNOSIS — Z0000 Encounter for general adult medical examination without abnormal findings: Secondary | ICD-10-CM | POA: Diagnosis not present

## 2021-07-05 DIAGNOSIS — E118 Type 2 diabetes mellitus with unspecified complications: Secondary | ICD-10-CM | POA: Diagnosis not present

## 2021-07-05 DIAGNOSIS — Z125 Encounter for screening for malignant neoplasm of prostate: Secondary | ICD-10-CM | POA: Diagnosis not present

## 2021-07-05 DIAGNOSIS — E782 Mixed hyperlipidemia: Secondary | ICD-10-CM | POA: Diagnosis not present

## 2021-07-05 DIAGNOSIS — I10 Essential (primary) hypertension: Secondary | ICD-10-CM | POA: Diagnosis not present

## 2021-07-05 DIAGNOSIS — I2510 Atherosclerotic heart disease of native coronary artery without angina pectoris: Secondary | ICD-10-CM | POA: Diagnosis not present

## 2021-07-05 DIAGNOSIS — Z79899 Other long term (current) drug therapy: Secondary | ICD-10-CM | POA: Diagnosis not present

## 2021-07-11 DIAGNOSIS — E538 Deficiency of other specified B group vitamins: Secondary | ICD-10-CM | POA: Diagnosis not present

## 2021-07-13 DIAGNOSIS — H25012 Cortical age-related cataract, left eye: Secondary | ICD-10-CM | POA: Diagnosis not present

## 2021-07-13 DIAGNOSIS — H2512 Age-related nuclear cataract, left eye: Secondary | ICD-10-CM | POA: Diagnosis not present

## 2021-07-14 DIAGNOSIS — H2511 Age-related nuclear cataract, right eye: Secondary | ICD-10-CM | POA: Diagnosis not present

## 2021-07-19 DIAGNOSIS — D1801 Hemangioma of skin and subcutaneous tissue: Secondary | ICD-10-CM | POA: Diagnosis not present

## 2021-07-19 DIAGNOSIS — Z85828 Personal history of other malignant neoplasm of skin: Secondary | ICD-10-CM | POA: Diagnosis not present

## 2021-07-19 DIAGNOSIS — Z79899 Other long term (current) drug therapy: Secondary | ICD-10-CM | POA: Diagnosis not present

## 2021-07-19 DIAGNOSIS — L821 Other seborrheic keratosis: Secondary | ICD-10-CM | POA: Diagnosis not present

## 2021-07-19 DIAGNOSIS — C4442 Squamous cell carcinoma of skin of scalp and neck: Secondary | ICD-10-CM | POA: Diagnosis not present

## 2021-07-27 DIAGNOSIS — H2511 Age-related nuclear cataract, right eye: Secondary | ICD-10-CM | POA: Diagnosis not present

## 2021-07-27 DIAGNOSIS — H25011 Cortical age-related cataract, right eye: Secondary | ICD-10-CM | POA: Diagnosis not present

## 2021-08-21 DIAGNOSIS — Z8589 Personal history of malignant neoplasm of other organs and systems: Secondary | ICD-10-CM | POA: Diagnosis not present

## 2021-08-21 DIAGNOSIS — M952 Other acquired deformity of head: Secondary | ICD-10-CM | POA: Diagnosis not present

## 2021-08-21 DIAGNOSIS — H02106 Unspecified ectropion of left eye, unspecified eyelid: Secondary | ICD-10-CM | POA: Diagnosis not present

## 2021-08-21 DIAGNOSIS — H02234 Paralytic lagophthalmos left upper eyelid: Secondary | ICD-10-CM | POA: Diagnosis not present

## 2021-09-01 DIAGNOSIS — C44321 Squamous cell carcinoma of skin of nose: Secondary | ICD-10-CM | POA: Diagnosis not present

## 2021-09-01 DIAGNOSIS — L570 Actinic keratosis: Secondary | ICD-10-CM | POA: Diagnosis not present

## 2021-09-01 DIAGNOSIS — Z85828 Personal history of other malignant neoplasm of skin: Secondary | ICD-10-CM | POA: Diagnosis not present

## 2021-09-01 DIAGNOSIS — C4442 Squamous cell carcinoma of skin of scalp and neck: Secondary | ICD-10-CM | POA: Diagnosis not present

## 2021-09-01 DIAGNOSIS — L578 Other skin changes due to chronic exposure to nonionizing radiation: Secondary | ICD-10-CM | POA: Diagnosis not present

## 2021-09-01 DIAGNOSIS — Z79899 Other long term (current) drug therapy: Secondary | ICD-10-CM | POA: Diagnosis not present

## 2021-10-09 DIAGNOSIS — C44321 Squamous cell carcinoma of skin of nose: Secondary | ICD-10-CM | POA: Diagnosis not present

## 2021-10-09 DIAGNOSIS — Z85828 Personal history of other malignant neoplasm of skin: Secondary | ICD-10-CM | POA: Diagnosis not present

## 2021-10-18 DIAGNOSIS — E7800 Pure hypercholesterolemia, unspecified: Secondary | ICD-10-CM | POA: Diagnosis not present

## 2021-10-18 DIAGNOSIS — E118 Type 2 diabetes mellitus with unspecified complications: Secondary | ICD-10-CM | POA: Diagnosis not present

## 2021-10-18 DIAGNOSIS — E782 Mixed hyperlipidemia: Secondary | ICD-10-CM | POA: Diagnosis not present

## 2021-10-18 DIAGNOSIS — Z125 Encounter for screening for malignant neoplasm of prostate: Secondary | ICD-10-CM | POA: Diagnosis not present

## 2021-10-18 DIAGNOSIS — E538 Deficiency of other specified B group vitamins: Secondary | ICD-10-CM | POA: Diagnosis not present

## 2021-10-18 DIAGNOSIS — Z79899 Other long term (current) drug therapy: Secondary | ICD-10-CM | POA: Diagnosis not present

## 2021-10-18 DIAGNOSIS — I2510 Atherosclerotic heart disease of native coronary artery without angina pectoris: Secondary | ICD-10-CM | POA: Diagnosis not present

## 2021-10-18 DIAGNOSIS — I10 Essential (primary) hypertension: Secondary | ICD-10-CM | POA: Diagnosis not present

## 2021-10-26 DIAGNOSIS — Z79899 Other long term (current) drug therapy: Secondary | ICD-10-CM | POA: Diagnosis not present

## 2021-10-26 DIAGNOSIS — H02106 Unspecified ectropion of left eye, unspecified eyelid: Secondary | ICD-10-CM | POA: Diagnosis not present

## 2021-10-26 DIAGNOSIS — Z85830 Personal history of malignant neoplasm of bone: Secondary | ICD-10-CM | POA: Diagnosis not present

## 2021-10-26 DIAGNOSIS — I252 Old myocardial infarction: Secondary | ICD-10-CM | POA: Diagnosis not present

## 2021-10-26 DIAGNOSIS — I10 Essential (primary) hypertension: Secondary | ICD-10-CM | POA: Diagnosis not present

## 2021-10-26 DIAGNOSIS — Z85848 Personal history of malignant neoplasm of other parts of nervous tissue: Secondary | ICD-10-CM | POA: Diagnosis not present

## 2021-10-26 DIAGNOSIS — H02432 Paralytic ptosis of left eyelid: Secondary | ICD-10-CM | POA: Diagnosis not present

## 2021-10-26 DIAGNOSIS — M952 Other acquired deformity of head: Secondary | ICD-10-CM | POA: Diagnosis not present

## 2021-10-26 DIAGNOSIS — H16212 Exposure keratoconjunctivitis, left eye: Secondary | ICD-10-CM | POA: Diagnosis not present

## 2021-10-26 DIAGNOSIS — H02234 Paralytic lagophthalmos left upper eyelid: Secondary | ICD-10-CM | POA: Diagnosis not present

## 2021-10-26 DIAGNOSIS — E785 Hyperlipidemia, unspecified: Secondary | ICD-10-CM | POA: Diagnosis not present

## 2021-10-26 DIAGNOSIS — Z85828 Personal history of other malignant neoplasm of skin: Secondary | ICD-10-CM | POA: Diagnosis not present

## 2021-10-26 DIAGNOSIS — H0289 Other specified disorders of eyelid: Secondary | ICD-10-CM | POA: Diagnosis not present

## 2021-10-26 DIAGNOSIS — G510 Bell's palsy: Secondary | ICD-10-CM | POA: Diagnosis not present

## 2021-10-26 DIAGNOSIS — G8389 Other specified paralytic syndromes: Secondary | ICD-10-CM | POA: Diagnosis not present

## 2021-10-26 DIAGNOSIS — G629 Polyneuropathy, unspecified: Secondary | ICD-10-CM | POA: Diagnosis not present

## 2021-10-26 DIAGNOSIS — H0520 Unspecified exophthalmos: Secondary | ICD-10-CM | POA: Diagnosis not present

## 2021-10-26 DIAGNOSIS — R42 Dizziness and giddiness: Secondary | ICD-10-CM | POA: Diagnosis not present

## 2021-10-26 DIAGNOSIS — R7303 Prediabetes: Secondary | ICD-10-CM | POA: Diagnosis not present

## 2021-10-26 DIAGNOSIS — K219 Gastro-esophageal reflux disease without esophagitis: Secondary | ICD-10-CM | POA: Diagnosis not present

## 2021-10-26 DIAGNOSIS — Z955 Presence of coronary angioplasty implant and graft: Secondary | ICD-10-CM | POA: Diagnosis not present

## 2021-10-26 DIAGNOSIS — H189 Unspecified disorder of cornea: Secondary | ICD-10-CM | POA: Diagnosis not present

## 2021-10-26 DIAGNOSIS — J3489 Other specified disorders of nose and nasal sinuses: Secondary | ICD-10-CM | POA: Diagnosis not present

## 2021-10-26 DIAGNOSIS — Z7982 Long term (current) use of aspirin: Secondary | ICD-10-CM | POA: Diagnosis not present

## 2021-10-26 DIAGNOSIS — I2510 Atherosclerotic heart disease of native coronary artery without angina pectoris: Secondary | ICD-10-CM | POA: Diagnosis not present

## 2021-10-26 DIAGNOSIS — R531 Weakness: Secondary | ICD-10-CM | POA: Diagnosis not present

## 2021-10-30 DIAGNOSIS — I10 Essential (primary) hypertension: Secondary | ICD-10-CM | POA: Diagnosis not present

## 2021-10-30 DIAGNOSIS — E782 Mixed hyperlipidemia: Secondary | ICD-10-CM | POA: Diagnosis not present

## 2021-10-30 DIAGNOSIS — I2510 Atherosclerotic heart disease of native coronary artery without angina pectoris: Secondary | ICD-10-CM | POA: Diagnosis not present

## 2021-11-16 DIAGNOSIS — Z7984 Long term (current) use of oral hypoglycemic drugs: Secondary | ICD-10-CM | POA: Diagnosis not present

## 2021-11-16 DIAGNOSIS — H401131 Primary open-angle glaucoma, bilateral, mild stage: Secondary | ICD-10-CM | POA: Diagnosis not present

## 2021-11-16 DIAGNOSIS — E119 Type 2 diabetes mellitus without complications: Secondary | ICD-10-CM | POA: Diagnosis not present

## 2021-11-16 DIAGNOSIS — H43813 Vitreous degeneration, bilateral: Secondary | ICD-10-CM | POA: Diagnosis not present

## 2021-11-16 DIAGNOSIS — H10232 Serous conjunctivitis, except viral, left eye: Secondary | ICD-10-CM | POA: Diagnosis not present

## 2021-11-16 DIAGNOSIS — H4322 Crystalline deposits in vitreous body, left eye: Secondary | ICD-10-CM | POA: Diagnosis not present

## 2021-11-16 DIAGNOSIS — H16142 Punctate keratitis, left eye: Secondary | ICD-10-CM | POA: Diagnosis not present

## 2021-11-16 DIAGNOSIS — H35373 Puckering of macula, bilateral: Secondary | ICD-10-CM | POA: Diagnosis not present

## 2021-11-16 DIAGNOSIS — H02055 Trichiasis without entropian left lower eyelid: Secondary | ICD-10-CM | POA: Diagnosis not present

## 2021-11-16 DIAGNOSIS — H02155 Paralytic ectropion of left lower eyelid: Secondary | ICD-10-CM | POA: Diagnosis not present

## 2021-11-20 DIAGNOSIS — E538 Deficiency of other specified B group vitamins: Secondary | ICD-10-CM | POA: Diagnosis not present

## 2021-12-04 DIAGNOSIS — L570 Actinic keratosis: Secondary | ICD-10-CM | POA: Diagnosis not present

## 2021-12-04 DIAGNOSIS — D044 Carcinoma in situ of skin of scalp and neck: Secondary | ICD-10-CM | POA: Diagnosis not present

## 2021-12-04 DIAGNOSIS — Z85828 Personal history of other malignant neoplasm of skin: Secondary | ICD-10-CM | POA: Diagnosis not present

## 2021-12-20 DIAGNOSIS — H4322 Crystalline deposits in vitreous body, left eye: Secondary | ICD-10-CM | POA: Diagnosis not present

## 2021-12-20 DIAGNOSIS — Z7984 Long term (current) use of oral hypoglycemic drugs: Secondary | ICD-10-CM | POA: Diagnosis not present

## 2021-12-20 DIAGNOSIS — H401131 Primary open-angle glaucoma, bilateral, mild stage: Secondary | ICD-10-CM | POA: Diagnosis not present

## 2021-12-20 DIAGNOSIS — H43813 Vitreous degeneration, bilateral: Secondary | ICD-10-CM | POA: Diagnosis not present

## 2021-12-20 DIAGNOSIS — E119 Type 2 diabetes mellitus without complications: Secondary | ICD-10-CM | POA: Diagnosis not present

## 2021-12-20 DIAGNOSIS — H10232 Serous conjunctivitis, except viral, left eye: Secondary | ICD-10-CM | POA: Diagnosis not present

## 2021-12-20 DIAGNOSIS — H35373 Puckering of macula, bilateral: Secondary | ICD-10-CM | POA: Diagnosis not present

## 2021-12-20 DIAGNOSIS — H02155 Paralytic ectropion of left lower eyelid: Secondary | ICD-10-CM | POA: Diagnosis not present

## 2021-12-21 DIAGNOSIS — E538 Deficiency of other specified B group vitamins: Secondary | ICD-10-CM | POA: Diagnosis not present

## 2022-01-23 DIAGNOSIS — Z7984 Long term (current) use of oral hypoglycemic drugs: Secondary | ICD-10-CM | POA: Diagnosis not present

## 2022-01-23 DIAGNOSIS — H35373 Puckering of macula, bilateral: Secondary | ICD-10-CM | POA: Diagnosis not present

## 2022-01-23 DIAGNOSIS — H43813 Vitreous degeneration, bilateral: Secondary | ICD-10-CM | POA: Diagnosis not present

## 2022-01-23 DIAGNOSIS — E119 Type 2 diabetes mellitus without complications: Secondary | ICD-10-CM | POA: Diagnosis not present

## 2022-01-23 DIAGNOSIS — H401131 Primary open-angle glaucoma, bilateral, mild stage: Secondary | ICD-10-CM | POA: Diagnosis not present

## 2022-01-23 DIAGNOSIS — H02155 Paralytic ectropion of left lower eyelid: Secondary | ICD-10-CM | POA: Diagnosis not present

## 2022-01-23 DIAGNOSIS — H4322 Crystalline deposits in vitreous body, left eye: Secondary | ICD-10-CM | POA: Diagnosis not present

## 2022-01-23 DIAGNOSIS — H10232 Serous conjunctivitis, except viral, left eye: Secondary | ICD-10-CM | POA: Diagnosis not present

## 2022-01-25 DIAGNOSIS — I10 Essential (primary) hypertension: Secondary | ICD-10-CM | POA: Diagnosis not present

## 2022-01-25 DIAGNOSIS — Z79899 Other long term (current) drug therapy: Secondary | ICD-10-CM | POA: Diagnosis not present

## 2022-01-25 DIAGNOSIS — E538 Deficiency of other specified B group vitamins: Secondary | ICD-10-CM | POA: Diagnosis not present

## 2022-01-25 DIAGNOSIS — E118 Type 2 diabetes mellitus with unspecified complications: Secondary | ICD-10-CM | POA: Diagnosis not present

## 2022-01-25 DIAGNOSIS — E782 Mixed hyperlipidemia: Secondary | ICD-10-CM | POA: Diagnosis not present

## 2022-01-25 DIAGNOSIS — I2510 Atherosclerotic heart disease of native coronary artery without angina pectoris: Secondary | ICD-10-CM | POA: Diagnosis not present

## 2022-02-05 DIAGNOSIS — D492 Neoplasm of unspecified behavior of bone, soft tissue, and skin: Secondary | ICD-10-CM | POA: Diagnosis not present

## 2022-02-05 DIAGNOSIS — Z09 Encounter for follow-up examination after completed treatment for conditions other than malignant neoplasm: Secondary | ICD-10-CM | POA: Diagnosis not present

## 2022-02-05 DIAGNOSIS — G510 Bell's palsy: Secondary | ICD-10-CM | POA: Diagnosis not present

## 2022-02-05 DIAGNOSIS — Z8589 Personal history of malignant neoplasm of other organs and systems: Secondary | ICD-10-CM | POA: Diagnosis not present

## 2022-02-05 DIAGNOSIS — M952 Other acquired deformity of head: Secondary | ICD-10-CM | POA: Diagnosis not present

## 2022-02-26 DIAGNOSIS — E538 Deficiency of other specified B group vitamins: Secondary | ICD-10-CM | POA: Diagnosis not present

## 2022-03-04 IMAGING — DX DG KNEE 1-2V*R*
2 series · 2 of 2 positions shown · non-contrast
Comparison: None.

CLINICAL DATA: has issues with his balance since having CA and
surgery that removed his left ear drum, states he lost his balance 2
days ago and is having right foot and knee pain.

EXAM:
RIGHT KNEE -2 VIEW

[knee ap]
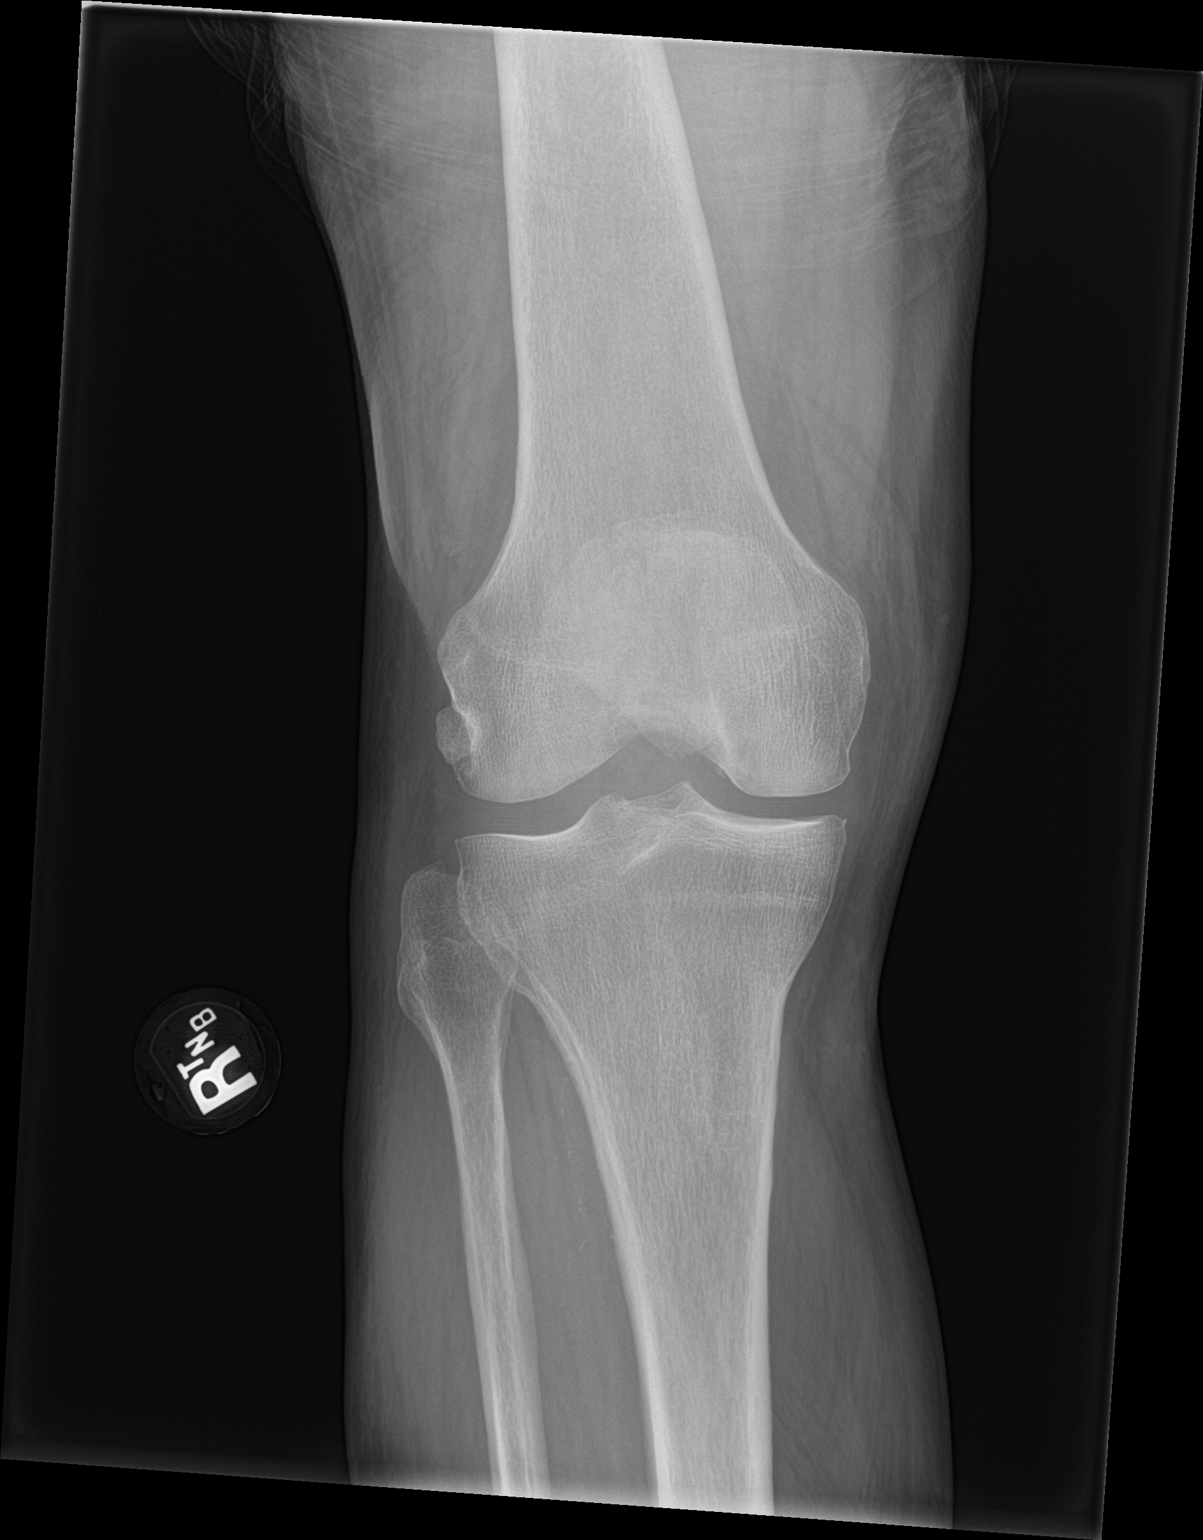

[knee lat]
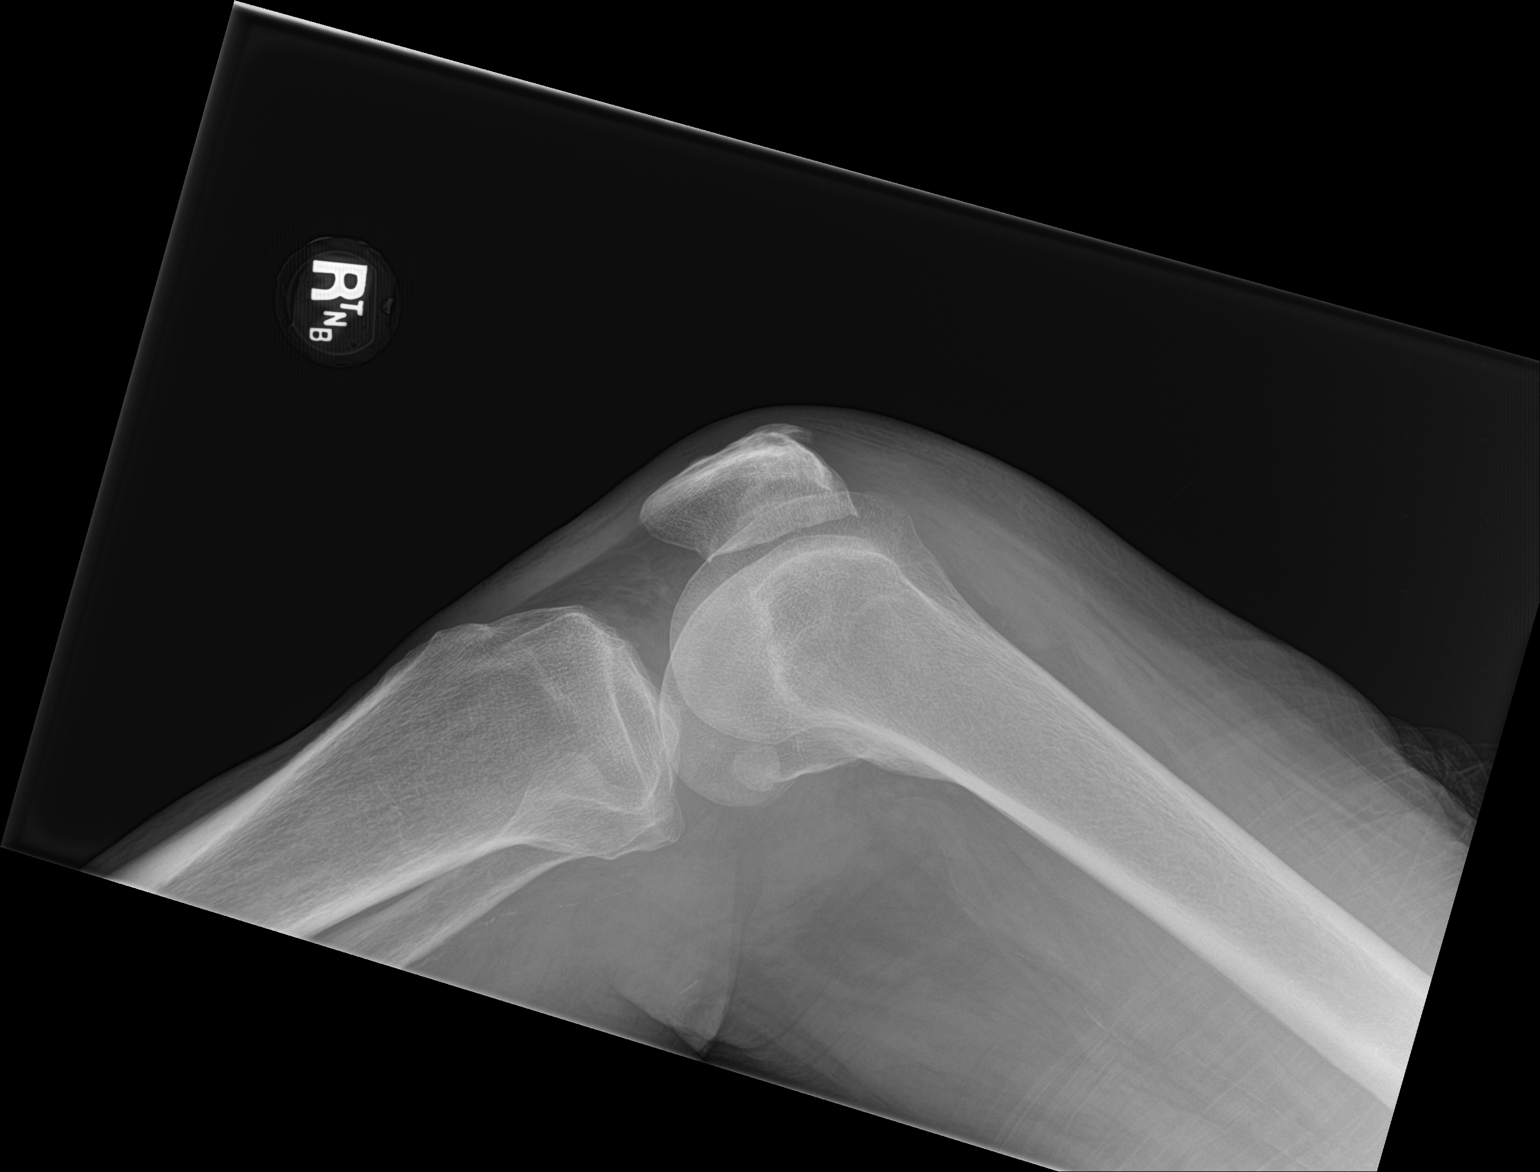

[2 of 2 positions shown; findings below may reference images not displayed]

FINDINGS: No fracture or bone lesion.

Knee joint is normally spaced and aligned. No significant
degenerative/arthropathic changes. No joint effusion.

Subcutaneous soft tissue edema, anterior lower leg and knee.
IMPRESSION: No fracture or dislocation.

## 2022-03-04 IMAGING — DX DG FOOT COMPLETE 3+V*R*
3 series · 3 of 3 positions shown · non-contrast
Comparison: None.

CLINICAL DATA: Patient lost balance 2 days ago and is having right
foot and knee pain.

EXAM:
RIGHT FOOT COMPLETE - 3+ VIEW; RIGHT ANKLE - 2 VIEW

[foot ap]
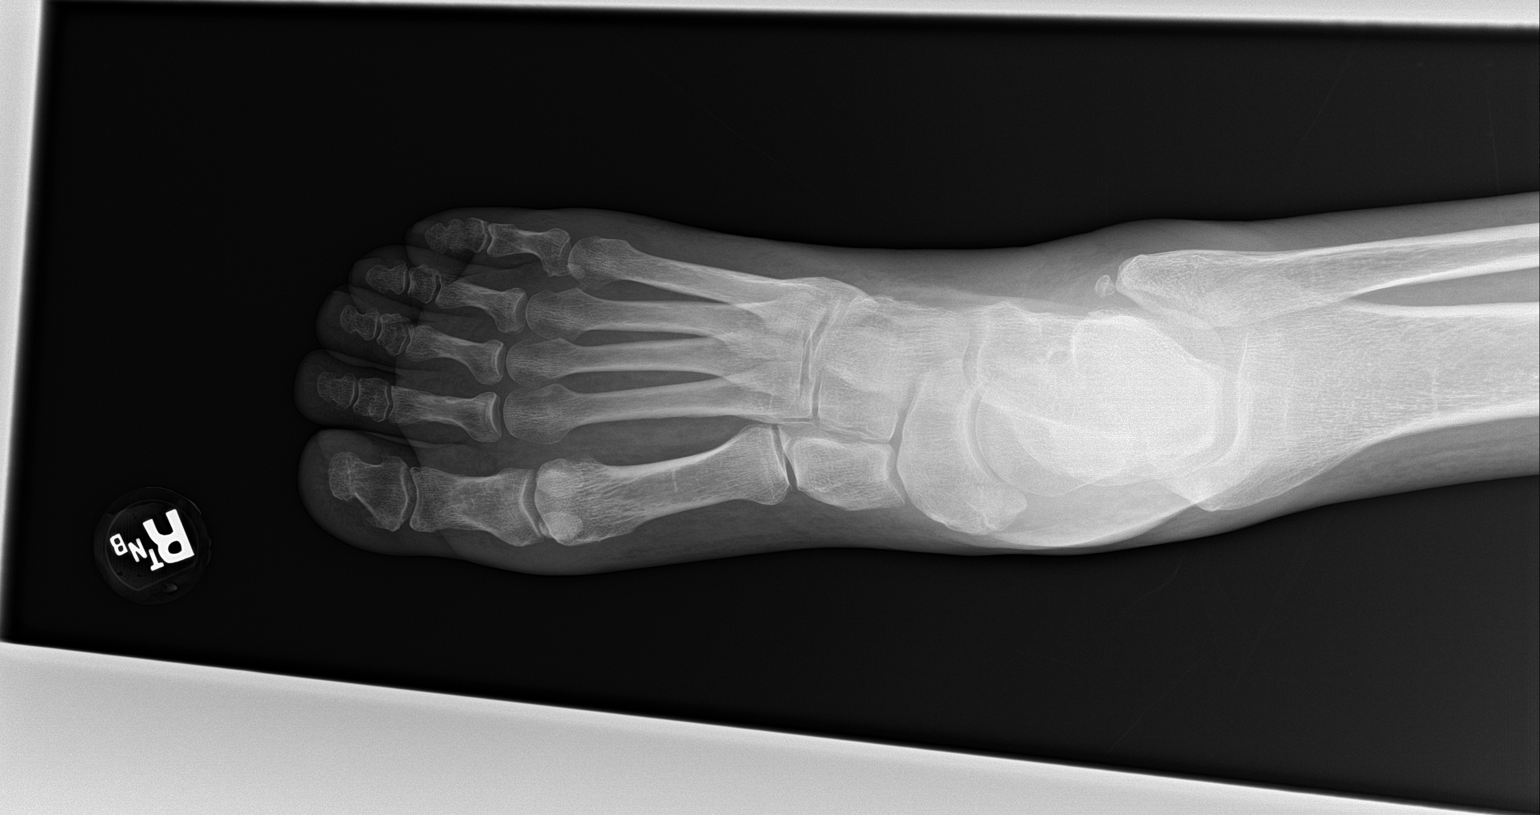

[foot obl]
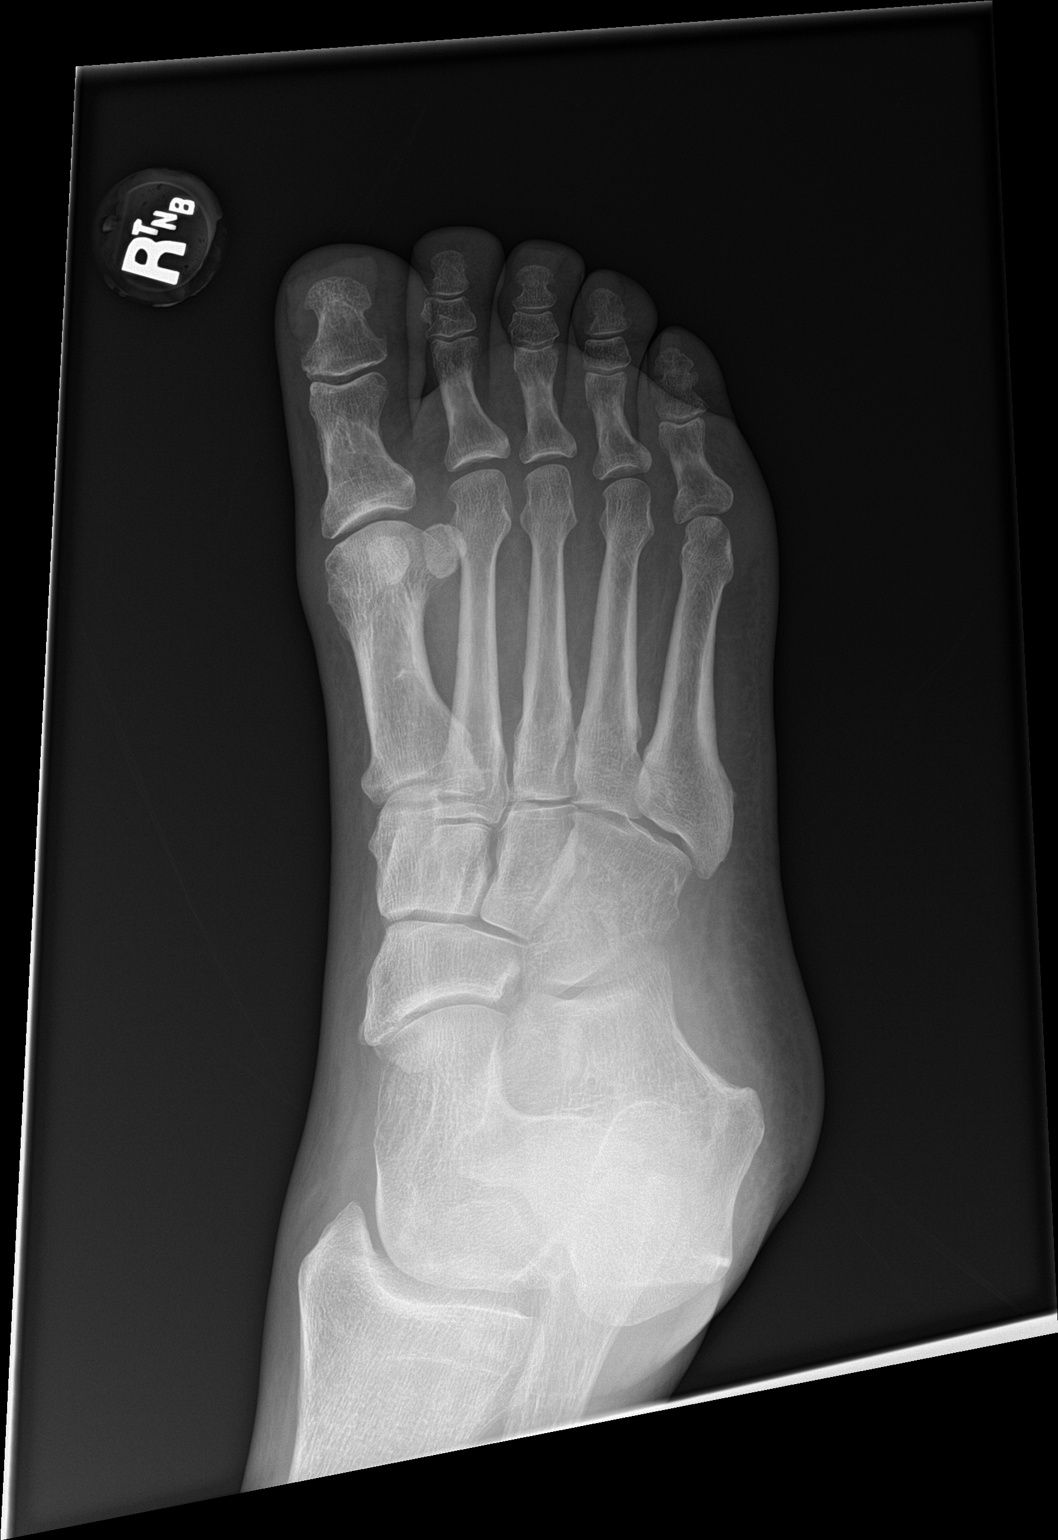

[foot lat]
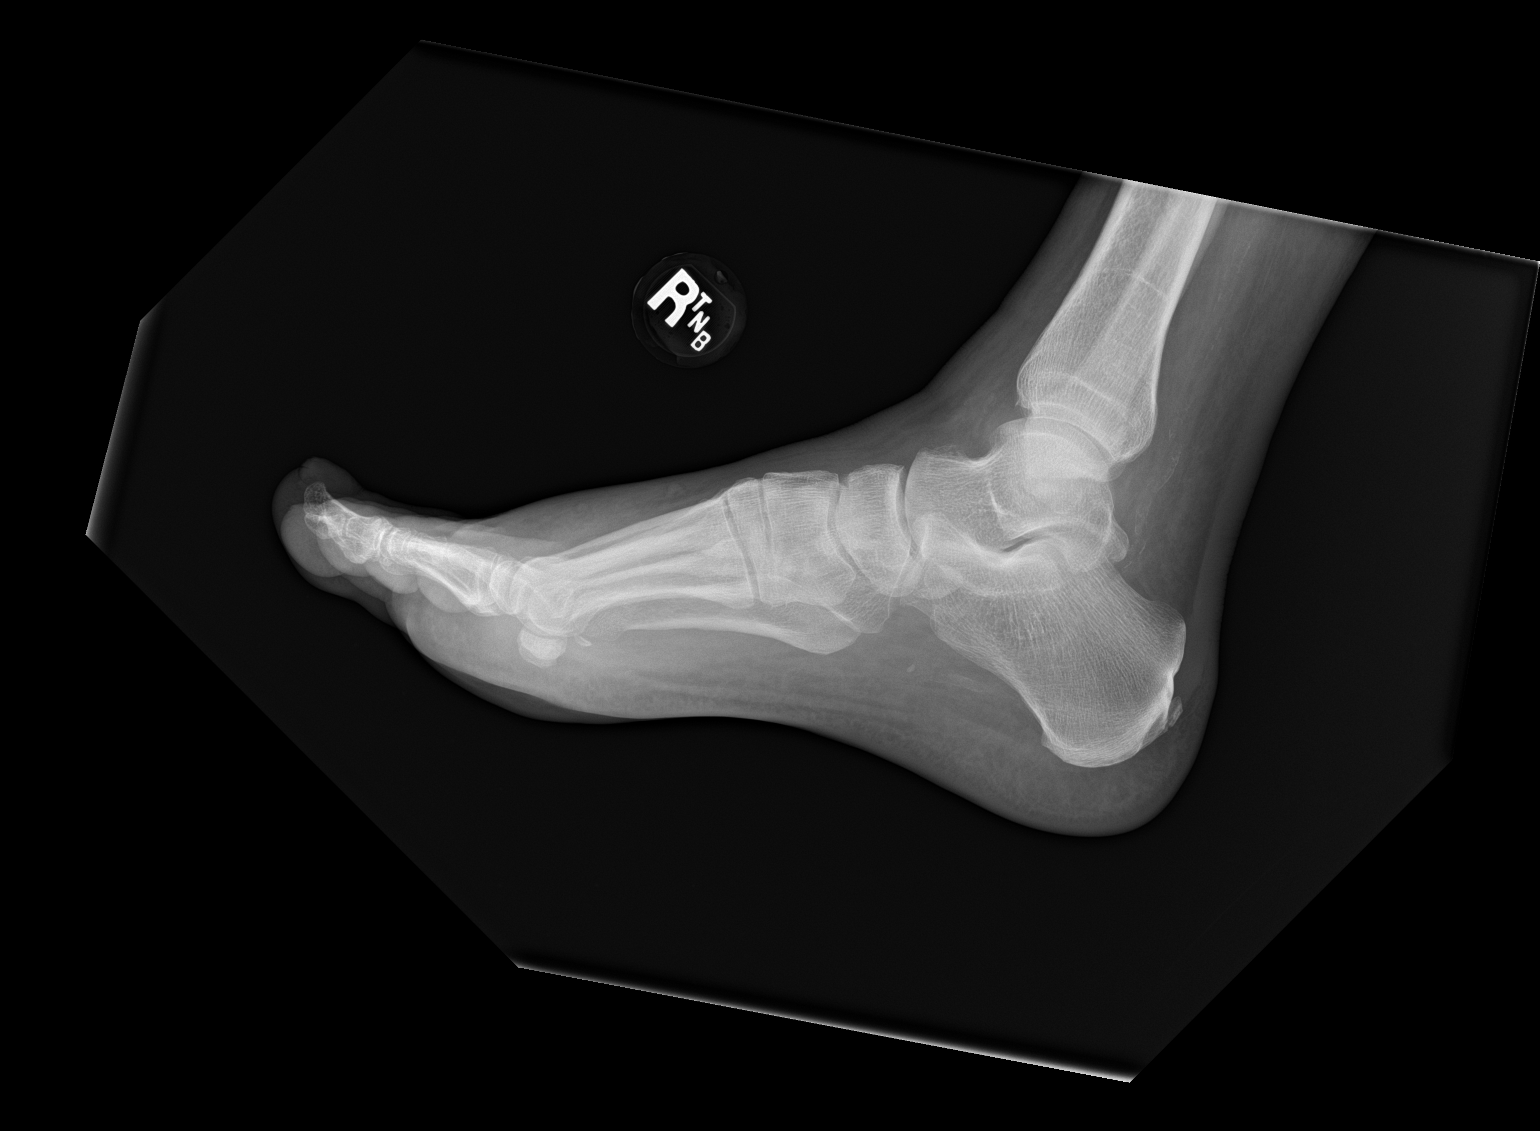

[3 of 3 positions shown; findings below may reference images not displayed]

FINDINGS: Minimally displaced avulsion fracture of the tip of the lateral
malleolus with mild overlying soft tissue swelling. The avulsed
fracture fragment is displaced distally approximately 0.2 cm. No
ankle dislocation. Trace ankle joint effusion. Small posterior
calcaneal enthesophyte. Vascular calcifications noted in the right
lower leg.

No fracture or dislocation in the right foot. Fusion of the fifth
toe middle and distal phalanges is likely congenital.
IMPRESSION: 1. Minimally displaced avulsion fracture of the tip of the lateral
malleolus with mild overlying soft tissue swelling.
2. No ankle joint dislocation.
3. No fracture or dislocation in the right foot.

## 2022-03-04 IMAGING — DX DG ANKLE 2V *R*
2 series · 2 of 2 positions shown · non-contrast
Comparison: None.

CLINICAL DATA: Patient lost balance 2 days ago and is having right
foot and knee pain.

EXAM:
RIGHT FOOT COMPLETE - 3+ VIEW; RIGHT ANKLE - 2 VIEW

[ankle ap]
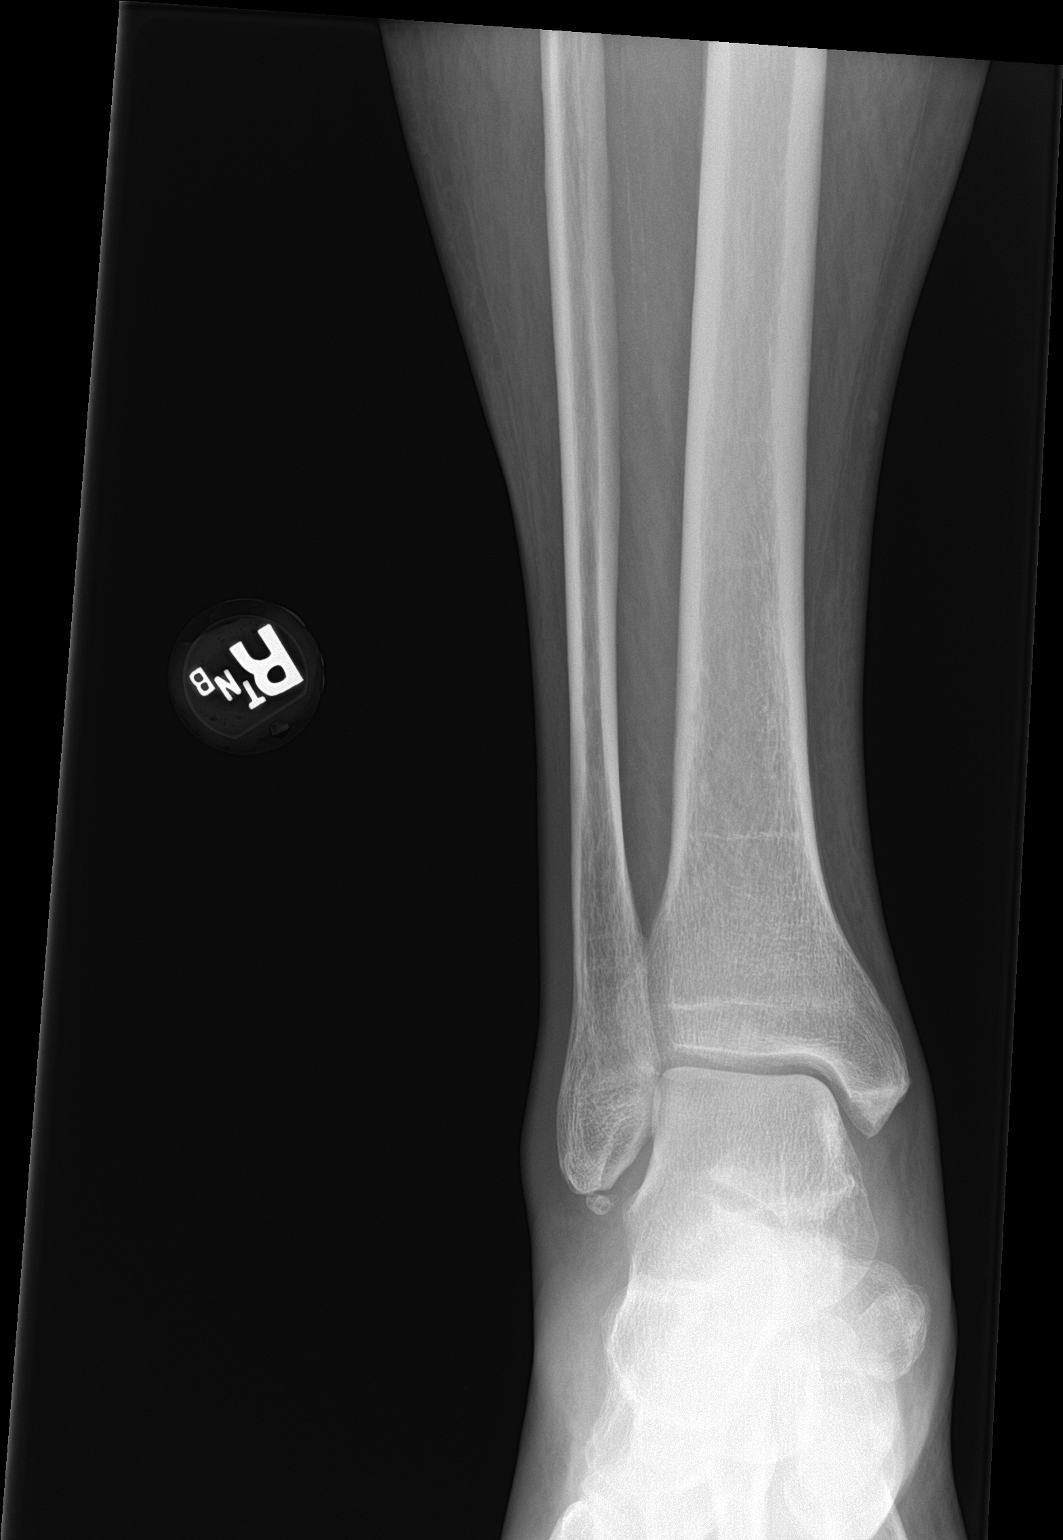

[ankle lat]
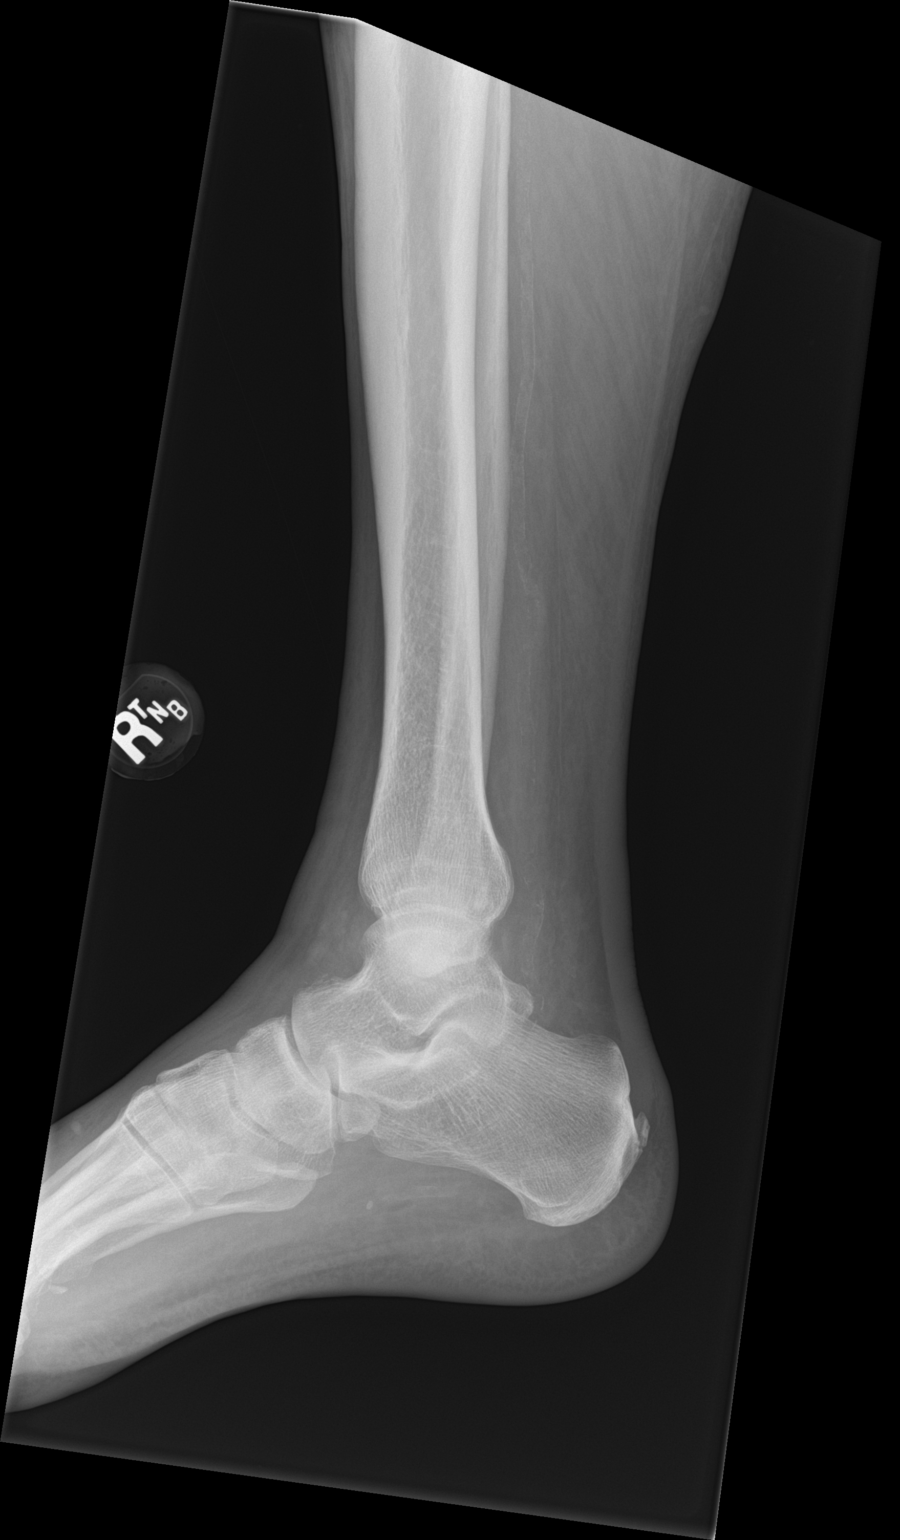

[2 of 2 positions shown; findings below may reference images not displayed]

FINDINGS: Minimally displaced avulsion fracture of the tip of the lateral
malleolus with mild overlying soft tissue swelling. The avulsed
fracture fragment is displaced distally approximately 0.2 cm. No
ankle dislocation. Trace ankle joint effusion. Small posterior
calcaneal enthesophyte. Vascular calcifications noted in the right
lower leg.

No fracture or dislocation in the right foot. Fusion of the fifth
toe middle and distal phalanges is likely congenital.
IMPRESSION: 1. Minimally displaced avulsion fracture of the tip of the lateral
malleolus with mild overlying soft tissue swelling.
2. No ankle joint dislocation.
3. No fracture or dislocation in the right foot.

## 2022-03-09 DIAGNOSIS — Z85828 Personal history of other malignant neoplasm of skin: Secondary | ICD-10-CM | POA: Diagnosis not present

## 2022-03-09 DIAGNOSIS — L821 Other seborrheic keratosis: Secondary | ICD-10-CM | POA: Diagnosis not present

## 2022-03-09 DIAGNOSIS — L570 Actinic keratosis: Secondary | ICD-10-CM | POA: Diagnosis not present

## 2022-03-09 DIAGNOSIS — D0421 Carcinoma in situ of skin of right ear and external auricular canal: Secondary | ICD-10-CM | POA: Diagnosis not present

## 2022-03-09 DIAGNOSIS — D0439 Carcinoma in situ of skin of other parts of face: Secondary | ICD-10-CM | POA: Diagnosis not present

## 2022-03-19 DIAGNOSIS — H401131 Primary open-angle glaucoma, bilateral, mild stage: Secondary | ICD-10-CM | POA: Diagnosis not present

## 2022-03-19 DIAGNOSIS — Z7984 Long term (current) use of oral hypoglycemic drugs: Secondary | ICD-10-CM | POA: Diagnosis not present

## 2022-03-19 DIAGNOSIS — H4322 Crystalline deposits in vitreous body, left eye: Secondary | ICD-10-CM | POA: Diagnosis not present

## 2022-03-19 DIAGNOSIS — H43813 Vitreous degeneration, bilateral: Secondary | ICD-10-CM | POA: Diagnosis not present

## 2022-03-19 DIAGNOSIS — H02155 Paralytic ectropion of left lower eyelid: Secondary | ICD-10-CM | POA: Diagnosis not present

## 2022-03-19 DIAGNOSIS — H10232 Serous conjunctivitis, except viral, left eye: Secondary | ICD-10-CM | POA: Diagnosis not present

## 2022-03-19 DIAGNOSIS — H35373 Puckering of macula, bilateral: Secondary | ICD-10-CM | POA: Diagnosis not present

## 2022-03-19 DIAGNOSIS — E119 Type 2 diabetes mellitus without complications: Secondary | ICD-10-CM | POA: Diagnosis not present

## 2022-03-29 DIAGNOSIS — E538 Deficiency of other specified B group vitamins: Secondary | ICD-10-CM | POA: Diagnosis not present

## 2022-04-11 DIAGNOSIS — L570 Actinic keratosis: Secondary | ICD-10-CM | POA: Diagnosis not present

## 2022-04-11 DIAGNOSIS — D0439 Carcinoma in situ of skin of other parts of face: Secondary | ICD-10-CM | POA: Diagnosis not present

## 2022-04-11 DIAGNOSIS — D0421 Carcinoma in situ of skin of right ear and external auricular canal: Secondary | ICD-10-CM | POA: Diagnosis not present

## 2022-04-11 DIAGNOSIS — Z85828 Personal history of other malignant neoplasm of skin: Secondary | ICD-10-CM | POA: Diagnosis not present

## 2022-05-03 DIAGNOSIS — E538 Deficiency of other specified B group vitamins: Secondary | ICD-10-CM | POA: Diagnosis not present

## 2022-05-15 DIAGNOSIS — I10 Essential (primary) hypertension: Secondary | ICD-10-CM | POA: Diagnosis not present

## 2022-05-15 DIAGNOSIS — Z79899 Other long term (current) drug therapy: Secondary | ICD-10-CM | POA: Diagnosis not present

## 2022-05-15 DIAGNOSIS — I2510 Atherosclerotic heart disease of native coronary artery without angina pectoris: Secondary | ICD-10-CM | POA: Diagnosis not present

## 2022-05-15 DIAGNOSIS — Z0000 Encounter for general adult medical examination without abnormal findings: Secondary | ICD-10-CM | POA: Diagnosis not present

## 2022-05-15 DIAGNOSIS — E782 Mixed hyperlipidemia: Secondary | ICD-10-CM | POA: Diagnosis not present

## 2022-05-15 DIAGNOSIS — E538 Deficiency of other specified B group vitamins: Secondary | ICD-10-CM | POA: Diagnosis not present

## 2022-05-15 DIAGNOSIS — E118 Type 2 diabetes mellitus with unspecified complications: Secondary | ICD-10-CM | POA: Diagnosis not present

## 2022-05-31 DIAGNOSIS — I10 Essential (primary) hypertension: Secondary | ICD-10-CM | POA: Diagnosis not present

## 2022-06-01 DIAGNOSIS — L570 Actinic keratosis: Secondary | ICD-10-CM | POA: Diagnosis not present

## 2022-06-01 DIAGNOSIS — C4442 Squamous cell carcinoma of skin of scalp and neck: Secondary | ICD-10-CM | POA: Diagnosis not present

## 2022-06-01 DIAGNOSIS — Z85828 Personal history of other malignant neoplasm of skin: Secondary | ICD-10-CM | POA: Diagnosis not present

## 2022-06-04 DIAGNOSIS — E538 Deficiency of other specified B group vitamins: Secondary | ICD-10-CM | POA: Diagnosis not present

## 2022-06-18 DIAGNOSIS — H43813 Vitreous degeneration, bilateral: Secondary | ICD-10-CM | POA: Diagnosis not present

## 2022-06-18 DIAGNOSIS — E119 Type 2 diabetes mellitus without complications: Secondary | ICD-10-CM | POA: Diagnosis not present

## 2022-06-18 DIAGNOSIS — H401131 Primary open-angle glaucoma, bilateral, mild stage: Secondary | ICD-10-CM | POA: Diagnosis not present

## 2022-06-18 DIAGNOSIS — H4322 Crystalline deposits in vitreous body, left eye: Secondary | ICD-10-CM | POA: Diagnosis not present

## 2022-06-18 DIAGNOSIS — H02155 Paralytic ectropion of left lower eyelid: Secondary | ICD-10-CM | POA: Diagnosis not present

## 2022-06-18 DIAGNOSIS — Z7984 Long term (current) use of oral hypoglycemic drugs: Secondary | ICD-10-CM | POA: Diagnosis not present

## 2022-06-18 DIAGNOSIS — H10232 Serous conjunctivitis, except viral, left eye: Secondary | ICD-10-CM | POA: Diagnosis not present

## 2022-06-18 DIAGNOSIS — H35373 Puckering of macula, bilateral: Secondary | ICD-10-CM | POA: Diagnosis not present

## 2022-07-03 DIAGNOSIS — E538 Deficiency of other specified B group vitamins: Secondary | ICD-10-CM | POA: Diagnosis not present

## 2022-07-03 DIAGNOSIS — I10 Essential (primary) hypertension: Secondary | ICD-10-CM | POA: Diagnosis not present

## 2022-07-03 DIAGNOSIS — E782 Mixed hyperlipidemia: Secondary | ICD-10-CM | POA: Diagnosis not present

## 2022-07-03 DIAGNOSIS — Z23 Encounter for immunization: Secondary | ICD-10-CM | POA: Diagnosis not present

## 2022-07-03 DIAGNOSIS — I2510 Atherosclerotic heart disease of native coronary artery without angina pectoris: Secondary | ICD-10-CM | POA: Diagnosis not present

## 2022-07-03 DIAGNOSIS — E118 Type 2 diabetes mellitus with unspecified complications: Secondary | ICD-10-CM | POA: Diagnosis not present

## 2022-07-09 DIAGNOSIS — L570 Actinic keratosis: Secondary | ICD-10-CM | POA: Diagnosis not present

## 2022-07-09 DIAGNOSIS — D044 Carcinoma in situ of skin of scalp and neck: Secondary | ICD-10-CM | POA: Diagnosis not present

## 2022-07-09 DIAGNOSIS — L814 Other melanin hyperpigmentation: Secondary | ICD-10-CM | POA: Diagnosis not present

## 2022-07-09 DIAGNOSIS — L821 Other seborrheic keratosis: Secondary | ICD-10-CM | POA: Diagnosis not present

## 2022-07-09 DIAGNOSIS — Z85828 Personal history of other malignant neoplasm of skin: Secondary | ICD-10-CM | POA: Diagnosis not present

## 2022-08-08 DIAGNOSIS — E538 Deficiency of other specified B group vitamins: Secondary | ICD-10-CM | POA: Diagnosis not present

## 2022-09-12 DIAGNOSIS — E538 Deficiency of other specified B group vitamins: Secondary | ICD-10-CM | POA: Diagnosis not present

## 2022-09-24 DIAGNOSIS — H401131 Primary open-angle glaucoma, bilateral, mild stage: Secondary | ICD-10-CM | POA: Diagnosis not present

## 2022-09-24 DIAGNOSIS — H4322 Crystalline deposits in vitreous body, left eye: Secondary | ICD-10-CM | POA: Diagnosis not present

## 2022-09-24 DIAGNOSIS — H43813 Vitreous degeneration, bilateral: Secondary | ICD-10-CM | POA: Diagnosis not present

## 2022-09-24 DIAGNOSIS — H02155 Paralytic ectropion of left lower eyelid: Secondary | ICD-10-CM | POA: Diagnosis not present

## 2022-09-24 DIAGNOSIS — H35373 Puckering of macula, bilateral: Secondary | ICD-10-CM | POA: Diagnosis not present

## 2022-09-24 DIAGNOSIS — E119 Type 2 diabetes mellitus without complications: Secondary | ICD-10-CM | POA: Diagnosis not present

## 2022-09-24 DIAGNOSIS — H10232 Serous conjunctivitis, except viral, left eye: Secondary | ICD-10-CM | POA: Diagnosis not present

## 2022-09-24 DIAGNOSIS — Z7984 Long term (current) use of oral hypoglycemic drugs: Secondary | ICD-10-CM | POA: Diagnosis not present

## 2022-10-10 DIAGNOSIS — L814 Other melanin hyperpigmentation: Secondary | ICD-10-CM | POA: Diagnosis not present

## 2022-10-10 DIAGNOSIS — L821 Other seborrheic keratosis: Secondary | ICD-10-CM | POA: Diagnosis not present

## 2022-10-10 DIAGNOSIS — L578 Other skin changes due to chronic exposure to nonionizing radiation: Secondary | ICD-10-CM | POA: Diagnosis not present

## 2022-10-10 DIAGNOSIS — L570 Actinic keratosis: Secondary | ICD-10-CM | POA: Diagnosis not present

## 2022-10-10 DIAGNOSIS — D044 Carcinoma in situ of skin of scalp and neck: Secondary | ICD-10-CM | POA: Diagnosis not present

## 2022-10-10 DIAGNOSIS — Z85828 Personal history of other malignant neoplasm of skin: Secondary | ICD-10-CM | POA: Diagnosis not present

## 2022-10-15 DIAGNOSIS — E538 Deficiency of other specified B group vitamins: Secondary | ICD-10-CM | POA: Diagnosis not present

## 2022-11-15 DIAGNOSIS — E538 Deficiency of other specified B group vitamins: Secondary | ICD-10-CM | POA: Diagnosis not present

## 2022-11-16 DIAGNOSIS — H02106 Unspecified ectropion of left eye, unspecified eyelid: Secondary | ICD-10-CM | POA: Diagnosis not present

## 2022-11-16 DIAGNOSIS — H029 Unspecified disorder of eyelid: Secondary | ICD-10-CM | POA: Diagnosis not present

## 2022-11-16 DIAGNOSIS — Z923 Personal history of irradiation: Secondary | ICD-10-CM | POA: Diagnosis not present

## 2022-11-16 DIAGNOSIS — G510 Bell's palsy: Secondary | ICD-10-CM | POA: Diagnosis not present

## 2022-11-30 DIAGNOSIS — E119 Type 2 diabetes mellitus without complications: Secondary | ICD-10-CM | POA: Diagnosis not present

## 2022-11-30 DIAGNOSIS — H10233 Serous conjunctivitis, except viral, bilateral: Secondary | ICD-10-CM | POA: Diagnosis not present

## 2022-11-30 DIAGNOSIS — H43813 Vitreous degeneration, bilateral: Secondary | ICD-10-CM | POA: Diagnosis not present

## 2022-11-30 DIAGNOSIS — H4322 Crystalline deposits in vitreous body, left eye: Secondary | ICD-10-CM | POA: Diagnosis not present

## 2022-11-30 DIAGNOSIS — H35373 Puckering of macula, bilateral: Secondary | ICD-10-CM | POA: Diagnosis not present

## 2022-11-30 DIAGNOSIS — E538 Deficiency of other specified B group vitamins: Secondary | ICD-10-CM | POA: Diagnosis not present

## 2022-11-30 DIAGNOSIS — H02155 Paralytic ectropion of left lower eyelid: Secondary | ICD-10-CM | POA: Diagnosis not present

## 2022-11-30 DIAGNOSIS — H401131 Primary open-angle glaucoma, bilateral, mild stage: Secondary | ICD-10-CM | POA: Diagnosis not present

## 2022-11-30 DIAGNOSIS — Z7984 Long term (current) use of oral hypoglycemic drugs: Secondary | ICD-10-CM | POA: Diagnosis not present

## 2022-12-14 DIAGNOSIS — E538 Deficiency of other specified B group vitamins: Secondary | ICD-10-CM | POA: Diagnosis not present

## 2022-12-28 DIAGNOSIS — E538 Deficiency of other specified B group vitamins: Secondary | ICD-10-CM | POA: Diagnosis not present

## 2023-01-07 DIAGNOSIS — Z23 Encounter for immunization: Secondary | ICD-10-CM | POA: Diagnosis not present

## 2023-01-07 DIAGNOSIS — E118 Type 2 diabetes mellitus with unspecified complications: Secondary | ICD-10-CM | POA: Diagnosis not present

## 2023-01-07 DIAGNOSIS — I2510 Atherosclerotic heart disease of native coronary artery without angina pectoris: Secondary | ICD-10-CM | POA: Diagnosis not present

## 2023-01-07 DIAGNOSIS — E782 Mixed hyperlipidemia: Secondary | ICD-10-CM | POA: Diagnosis not present

## 2023-01-07 DIAGNOSIS — I10 Essential (primary) hypertension: Secondary | ICD-10-CM | POA: Diagnosis not present

## 2023-01-11 DIAGNOSIS — E538 Deficiency of other specified B group vitamins: Secondary | ICD-10-CM | POA: Diagnosis not present

## 2023-01-15 DIAGNOSIS — H02155 Paralytic ectropion of left lower eyelid: Secondary | ICD-10-CM | POA: Diagnosis not present

## 2023-01-15 DIAGNOSIS — Z7984 Long term (current) use of oral hypoglycemic drugs: Secondary | ICD-10-CM | POA: Diagnosis not present

## 2023-01-15 DIAGNOSIS — E119 Type 2 diabetes mellitus without complications: Secondary | ICD-10-CM | POA: Diagnosis not present

## 2023-01-15 DIAGNOSIS — H35373 Puckering of macula, bilateral: Secondary | ICD-10-CM | POA: Diagnosis not present

## 2023-01-15 DIAGNOSIS — H43813 Vitreous degeneration, bilateral: Secondary | ICD-10-CM | POA: Diagnosis not present

## 2023-01-15 DIAGNOSIS — H401131 Primary open-angle glaucoma, bilateral, mild stage: Secondary | ICD-10-CM | POA: Diagnosis not present

## 2023-01-15 DIAGNOSIS — H4322 Crystalline deposits in vitreous body, left eye: Secondary | ICD-10-CM | POA: Diagnosis not present

## 2023-01-15 DIAGNOSIS — H10233 Serous conjunctivitis, except viral, bilateral: Secondary | ICD-10-CM | POA: Diagnosis not present

## 2023-01-22 DIAGNOSIS — L821 Other seborrheic keratosis: Secondary | ICD-10-CM | POA: Diagnosis not present

## 2023-01-22 DIAGNOSIS — L814 Other melanin hyperpigmentation: Secondary | ICD-10-CM | POA: Diagnosis not present

## 2023-01-22 DIAGNOSIS — D1801 Hemangioma of skin and subcutaneous tissue: Secondary | ICD-10-CM | POA: Diagnosis not present

## 2023-01-22 DIAGNOSIS — L578 Other skin changes due to chronic exposure to nonionizing radiation: Secondary | ICD-10-CM | POA: Diagnosis not present

## 2023-01-22 DIAGNOSIS — Z85828 Personal history of other malignant neoplasm of skin: Secondary | ICD-10-CM | POA: Diagnosis not present

## 2023-01-22 DIAGNOSIS — L0109 Other impetigo: Secondary | ICD-10-CM | POA: Diagnosis not present

## 2023-01-22 DIAGNOSIS — L570 Actinic keratosis: Secondary | ICD-10-CM | POA: Diagnosis not present

## 2023-01-23 DIAGNOSIS — I10 Essential (primary) hypertension: Secondary | ICD-10-CM | POA: Diagnosis not present

## 2023-01-23 DIAGNOSIS — E538 Deficiency of other specified B group vitamins: Secondary | ICD-10-CM | POA: Diagnosis not present

## 2023-01-23 DIAGNOSIS — M51361 Other intervertebral disc degeneration, lumbar region with lower extremity pain only: Secondary | ICD-10-CM | POA: Diagnosis not present

## 2023-01-23 DIAGNOSIS — E118 Type 2 diabetes mellitus with unspecified complications: Secondary | ICD-10-CM | POA: Diagnosis not present

## 2023-01-23 DIAGNOSIS — E782 Mixed hyperlipidemia: Secondary | ICD-10-CM | POA: Diagnosis not present

## 2023-01-23 DIAGNOSIS — Z125 Encounter for screening for malignant neoplasm of prostate: Secondary | ICD-10-CM | POA: Diagnosis not present

## 2023-01-23 DIAGNOSIS — Z79899 Other long term (current) drug therapy: Secondary | ICD-10-CM | POA: Diagnosis not present

## 2023-01-23 DIAGNOSIS — I2510 Atherosclerotic heart disease of native coronary artery without angina pectoris: Secondary | ICD-10-CM | POA: Diagnosis not present

## 2023-01-25 DIAGNOSIS — E538 Deficiency of other specified B group vitamins: Secondary | ICD-10-CM | POA: Diagnosis not present

## 2023-01-31 DIAGNOSIS — M5030 Other cervical disc degeneration, unspecified cervical region: Secondary | ICD-10-CM | POA: Diagnosis not present

## 2023-01-31 DIAGNOSIS — M19011 Primary osteoarthritis, right shoulder: Secondary | ICD-10-CM | POA: Diagnosis not present

## 2023-01-31 DIAGNOSIS — M25711 Osteophyte, right shoulder: Secondary | ICD-10-CM | POA: Diagnosis not present

## 2023-01-31 DIAGNOSIS — I10 Essential (primary) hypertension: Secondary | ICD-10-CM | POA: Diagnosis not present

## 2023-01-31 DIAGNOSIS — M47812 Spondylosis without myelopathy or radiculopathy, cervical region: Secondary | ICD-10-CM | POA: Diagnosis not present

## 2023-01-31 DIAGNOSIS — M25511 Pain in right shoulder: Secondary | ICD-10-CM | POA: Diagnosis not present

## 2023-01-31 DIAGNOSIS — M5136 Other intervertebral disc degeneration, lumbar region with discogenic back pain only: Secondary | ICD-10-CM | POA: Diagnosis not present

## 2023-01-31 DIAGNOSIS — M5416 Radiculopathy, lumbar region: Secondary | ICD-10-CM | POA: Diagnosis not present

## 2023-01-31 DIAGNOSIS — M47816 Spondylosis without myelopathy or radiculopathy, lumbar region: Secondary | ICD-10-CM | POA: Diagnosis not present

## 2023-02-01 ENCOUNTER — Other Ambulatory Visit: Payer: Self-pay | Admitting: Family Medicine

## 2023-02-01 DIAGNOSIS — M5416 Radiculopathy, lumbar region: Secondary | ICD-10-CM

## 2023-02-08 DIAGNOSIS — E538 Deficiency of other specified B group vitamins: Secondary | ICD-10-CM | POA: Diagnosis not present

## 2023-02-18 DIAGNOSIS — M25512 Pain in left shoulder: Secondary | ICD-10-CM | POA: Diagnosis not present

## 2023-02-18 DIAGNOSIS — G8929 Other chronic pain: Secondary | ICD-10-CM | POA: Diagnosis not present

## 2023-02-18 DIAGNOSIS — M19012 Primary osteoarthritis, left shoulder: Secondary | ICD-10-CM | POA: Diagnosis not present

## 2023-02-22 DIAGNOSIS — E538 Deficiency of other specified B group vitamins: Secondary | ICD-10-CM | POA: Diagnosis not present

## 2023-02-22 DIAGNOSIS — R710 Precipitous drop in hematocrit: Secondary | ICD-10-CM | POA: Diagnosis not present

## 2023-02-28 ENCOUNTER — Ambulatory Visit
Admission: RE | Admit: 2023-02-28 | Discharge: 2023-02-28 | Disposition: A | Discharge status: Home / Self Care | Payer: PPO | Source: Ambulatory Visit | Attending: Family Medicine | Admitting: Family Medicine

## 2023-02-28 DIAGNOSIS — M47816 Spondylosis without myelopathy or radiculopathy, lumbar region: Secondary | ICD-10-CM | POA: Diagnosis not present

## 2023-02-28 DIAGNOSIS — M5416 Radiculopathy, lumbar region: Secondary | ICD-10-CM

## 2023-03-08 DIAGNOSIS — E538 Deficiency of other specified B group vitamins: Secondary | ICD-10-CM | POA: Diagnosis not present

## 2023-03-18 DIAGNOSIS — M5030 Other cervical disc degeneration, unspecified cervical region: Secondary | ICD-10-CM | POA: Diagnosis not present

## 2023-03-18 DIAGNOSIS — M19011 Primary osteoarthritis, right shoulder: Secondary | ICD-10-CM | POA: Diagnosis not present

## 2023-03-18 DIAGNOSIS — M5416 Radiculopathy, lumbar region: Secondary | ICD-10-CM | POA: Diagnosis not present

## 2023-03-25 DIAGNOSIS — E538 Deficiency of other specified B group vitamins: Secondary | ICD-10-CM | POA: Diagnosis not present

## 2023-03-28 DIAGNOSIS — M25559 Pain in unspecified hip: Secondary | ICD-10-CM | POA: Diagnosis not present

## 2023-03-28 DIAGNOSIS — M545 Low back pain, unspecified: Secondary | ICD-10-CM | POA: Diagnosis not present

## 2023-04-02 DIAGNOSIS — M25559 Pain in unspecified hip: Secondary | ICD-10-CM | POA: Diagnosis not present

## 2023-04-02 DIAGNOSIS — M545 Low back pain, unspecified: Secondary | ICD-10-CM | POA: Diagnosis not present

## 2023-04-04 DIAGNOSIS — M25559 Pain in unspecified hip: Secondary | ICD-10-CM | POA: Diagnosis not present

## 2023-04-04 DIAGNOSIS — M545 Low back pain, unspecified: Secondary | ICD-10-CM | POA: Diagnosis not present

## 2023-04-08 DIAGNOSIS — E538 Deficiency of other specified B group vitamins: Secondary | ICD-10-CM | POA: Diagnosis not present

## 2023-04-10 DIAGNOSIS — M25559 Pain in unspecified hip: Secondary | ICD-10-CM | POA: Diagnosis not present

## 2023-04-10 DIAGNOSIS — M545 Low back pain, unspecified: Secondary | ICD-10-CM | POA: Diagnosis not present

## 2023-04-11 DIAGNOSIS — E538 Deficiency of other specified B group vitamins: Secondary | ICD-10-CM | POA: Diagnosis not present

## 2023-04-11 DIAGNOSIS — G608 Other hereditary and idiopathic neuropathies: Secondary | ICD-10-CM | POA: Diagnosis not present

## 2023-04-11 DIAGNOSIS — Z85828 Personal history of other malignant neoplasm of skin: Secondary | ICD-10-CM | POA: Diagnosis not present

## 2023-04-22 DIAGNOSIS — E538 Deficiency of other specified B group vitamins: Secondary | ICD-10-CM | POA: Diagnosis not present

## 2023-04-22 DIAGNOSIS — M545 Low back pain, unspecified: Secondary | ICD-10-CM | POA: Diagnosis not present

## 2023-04-22 DIAGNOSIS — M25559 Pain in unspecified hip: Secondary | ICD-10-CM | POA: Diagnosis not present

## 2023-04-26 DIAGNOSIS — M545 Low back pain, unspecified: Secondary | ICD-10-CM | POA: Diagnosis not present

## 2023-04-26 DIAGNOSIS — M25559 Pain in unspecified hip: Secondary | ICD-10-CM | POA: Diagnosis not present

## 2023-04-29 ENCOUNTER — Emergency Department: Payer: PPO

## 2023-04-29 ENCOUNTER — Other Ambulatory Visit: Payer: Self-pay

## 2023-04-29 ENCOUNTER — Inpatient Hospital Stay
Admission: EM | Admit: 2023-04-29 | Discharge: 2023-05-06 | DRG: 392 | Disposition: A | Discharge status: Skilled Nursing Facility | Payer: PPO | Attending: Internal Medicine | Admitting: Internal Medicine

## 2023-04-29 ENCOUNTER — Encounter: Payer: Self-pay | Admitting: Emergency Medicine

## 2023-04-29 DIAGNOSIS — E876 Hypokalemia: Secondary | ICD-10-CM | POA: Diagnosis present

## 2023-04-29 DIAGNOSIS — R112 Nausea with vomiting, unspecified: Secondary | ICD-10-CM | POA: Diagnosis not present

## 2023-04-29 DIAGNOSIS — K529 Noninfective gastroenteritis and colitis, unspecified: Secondary | ICD-10-CM | POA: Diagnosis not present

## 2023-04-29 DIAGNOSIS — E1140 Type 2 diabetes mellitus with diabetic neuropathy, unspecified: Secondary | ICD-10-CM | POA: Diagnosis present

## 2023-04-29 DIAGNOSIS — K2100 Gastro-esophageal reflux disease with esophagitis, without bleeding: Secondary | ICD-10-CM | POA: Diagnosis present

## 2023-04-29 DIAGNOSIS — Z8052 Family history of malignant neoplasm of bladder: Secondary | ICD-10-CM

## 2023-04-29 DIAGNOSIS — E860 Dehydration: Secondary | ICD-10-CM | POA: Diagnosis present

## 2023-04-29 DIAGNOSIS — R410 Disorientation, unspecified: Secondary | ICD-10-CM | POA: Diagnosis not present

## 2023-04-29 DIAGNOSIS — R296 Repeated falls: Secondary | ICD-10-CM | POA: Diagnosis present

## 2023-04-29 DIAGNOSIS — Z820 Family history of epilepsy and other diseases of the nervous system: Secondary | ICD-10-CM

## 2023-04-29 DIAGNOSIS — Z955 Presence of coronary angioplasty implant and graft: Secondary | ICD-10-CM

## 2023-04-29 DIAGNOSIS — Z87442 Personal history of urinary calculi: Secondary | ICD-10-CM

## 2023-04-29 DIAGNOSIS — F17220 Nicotine dependence, chewing tobacco, uncomplicated: Secondary | ICD-10-CM | POA: Diagnosis present

## 2023-04-29 DIAGNOSIS — R531 Weakness: Secondary | ICD-10-CM

## 2023-04-29 DIAGNOSIS — A0811 Acute gastroenteropathy due to Norwalk agent: Principal | ICD-10-CM | POA: Diagnosis present

## 2023-04-29 DIAGNOSIS — K8020 Calculus of gallbladder without cholecystitis without obstruction: Secondary | ICD-10-CM | POA: Diagnosis present

## 2023-04-29 DIAGNOSIS — Z7984 Long term (current) use of oral hypoglycemic drugs: Secondary | ICD-10-CM

## 2023-04-29 DIAGNOSIS — Z1152 Encounter for screening for COVID-19: Secondary | ICD-10-CM

## 2023-04-29 DIAGNOSIS — R1110 Vomiting, unspecified: Secondary | ICD-10-CM | POA: Diagnosis not present

## 2023-04-29 DIAGNOSIS — E785 Hyperlipidemia, unspecified: Secondary | ICD-10-CM | POA: Diagnosis present

## 2023-04-29 DIAGNOSIS — K219 Gastro-esophageal reflux disease without esophagitis: Secondary | ICD-10-CM | POA: Diagnosis present

## 2023-04-29 DIAGNOSIS — Z85828 Personal history of other malignant neoplasm of skin: Secondary | ICD-10-CM

## 2023-04-29 DIAGNOSIS — M795 Residual foreign body in soft tissue: Secondary | ICD-10-CM | POA: Diagnosis present

## 2023-04-29 DIAGNOSIS — Z751 Person awaiting admission to adequate facility elsewhere: Secondary | ICD-10-CM

## 2023-04-29 DIAGNOSIS — Z79899 Other long term (current) drug therapy: Secondary | ICD-10-CM

## 2023-04-29 DIAGNOSIS — Z7982 Long term (current) use of aspirin: Secondary | ICD-10-CM

## 2023-04-29 DIAGNOSIS — Z860101 Personal history of adenomatous and serrated colon polyps: Secondary | ICD-10-CM

## 2023-04-29 DIAGNOSIS — Z923 Personal history of irradiation: Secondary | ICD-10-CM

## 2023-04-29 DIAGNOSIS — R29898 Other symptoms and signs involving the musculoskeletal system: Secondary | ICD-10-CM | POA: Diagnosis present

## 2023-04-29 DIAGNOSIS — I2510 Atherosclerotic heart disease of native coronary artery without angina pectoris: Secondary | ICD-10-CM | POA: Diagnosis present

## 2023-04-29 DIAGNOSIS — K668 Other specified disorders of peritoneum: Secondary | ICD-10-CM | POA: Insufficient documentation

## 2023-04-29 DIAGNOSIS — I951 Orthostatic hypotension: Secondary | ICD-10-CM

## 2023-04-29 DIAGNOSIS — Z9181 History of falling: Secondary | ICD-10-CM

## 2023-04-29 DIAGNOSIS — D484 Neoplasm of uncertain behavior of peritoneum: Secondary | ICD-10-CM | POA: Diagnosis present

## 2023-04-29 DIAGNOSIS — R918 Other nonspecific abnormal finding of lung field: Secondary | ICD-10-CM | POA: Diagnosis not present

## 2023-04-29 DIAGNOSIS — A419 Sepsis, unspecified organism: Secondary | ICD-10-CM | POA: Diagnosis not present

## 2023-04-29 DIAGNOSIS — R55 Syncope and collapse: Secondary | ICD-10-CM | POA: Diagnosis not present

## 2023-04-29 DIAGNOSIS — Z808 Family history of malignant neoplasm of other organs or systems: Secondary | ICD-10-CM

## 2023-04-29 DIAGNOSIS — K209 Esophagitis, unspecified without bleeding: Secondary | ICD-10-CM | POA: Diagnosis present

## 2023-04-29 DIAGNOSIS — I252 Old myocardial infarction: Secondary | ICD-10-CM

## 2023-04-29 DIAGNOSIS — K2270 Barrett's esophagus without dysplasia: Secondary | ICD-10-CM | POA: Diagnosis present

## 2023-04-29 DIAGNOSIS — I10 Essential (primary) hypertension: Secondary | ICD-10-CM | POA: Diagnosis present

## 2023-04-29 DIAGNOSIS — D123 Benign neoplasm of transverse colon: Secondary | ICD-10-CM | POA: Diagnosis present

## 2023-04-29 DIAGNOSIS — E119 Type 2 diabetes mellitus without complications: Secondary | ICD-10-CM

## 2023-04-29 DIAGNOSIS — D122 Benign neoplasm of ascending colon: Secondary | ICD-10-CM | POA: Diagnosis present

## 2023-04-29 DIAGNOSIS — R54 Age-related physical debility: Secondary | ICD-10-CM | POA: Diagnosis present

## 2023-04-29 LAB — CBC WITH DIFFERENTIAL/PLATELET
Abs Immature Granulocytes: 0.05 10*3/uL (ref 0.00–0.07)
Basophils Absolute: 0 10*3/uL (ref 0.0–0.1)
Basophils Relative: 0 %
Eosinophils Absolute: 0 10*3/uL (ref 0.0–0.5)
Eosinophils Relative: 0 %
HCT: 45 % (ref 39.0–52.0)
Hemoglobin: 15.2 g/dL (ref 13.0–17.0)
Immature Granulocytes: 0 %
Lymphocytes Relative: 2 %
Lymphs Abs: 0.3 10*3/uL — ABNORMAL LOW (ref 0.7–4.0)
MCH: 32.4 pg (ref 26.0–34.0)
MCHC: 33.8 g/dL (ref 30.0–36.0)
MCV: 95.9 fL (ref 80.0–100.0)
Monocytes Absolute: 0.7 10*3/uL (ref 0.1–1.0)
Monocytes Relative: 6 %
Neutro Abs: 11.8 10*3/uL — ABNORMAL HIGH (ref 1.7–7.7)
Neutrophils Relative %: 92 %
Platelets: 159 10*3/uL (ref 150–400)
RBC: 4.69 MIL/uL (ref 4.22–5.81)
RDW: 12.7 % (ref 11.5–15.5)
WBC: 12.8 10*3/uL — ABNORMAL HIGH (ref 4.0–10.5)
nRBC: 0 % (ref 0.0–0.2)

## 2023-04-29 LAB — COMPREHENSIVE METABOLIC PANEL
ALT: 15 U/L (ref 0–44)
AST: 22 U/L (ref 15–41)
Albumin: 4 g/dL (ref 3.5–5.0)
Alkaline Phosphatase: 59 U/L (ref 38–126)
Anion gap: 13 (ref 5–15)
BUN: 11 mg/dL (ref 8–23)
CO2: 25 mmol/L (ref 22–32)
Calcium: 9.2 mg/dL (ref 8.9–10.3)
Chloride: 100 mmol/L (ref 98–111)
Creatinine, Ser: 0.88 mg/dL (ref 0.61–1.24)
GFR, Estimated: >60 mL/min (ref >60)
Glucose, Bld: 220 mg/dL — ABNORMAL HIGH (ref 70–99)
Potassium: 3.4 mmol/L — ABNORMAL LOW (ref 3.5–5.1)
Sodium: 138 mmol/L (ref 135–145)
Total Bilirubin: 1.5 mg/dL — ABNORMAL HIGH (ref 0.0–1.2)
Total Protein: 7.3 g/dL (ref 6.5–8.1)

## 2023-04-29 LAB — GASTROINTESTINAL PANEL BY PCR, STOOL (REPLACES STOOL CULTURE)

## 2023-04-29 LAB — LACTIC ACID, PLASMA
Lactic Acid, Venous: 1.7 mmol/L (ref 0.5–1.9)
Lactic Acid, Venous: 1.5 mmol/L (ref 0.5–1.9)

## 2023-04-29 LAB — TROPONIN I (HIGH SENSITIVITY)
Troponin I (High Sensitivity): 11 ng/L (ref <18)
Troponin I (High Sensitivity): 11 ng/L (ref <18)

## 2023-04-29 LAB — URINALYSIS, W/ REFLEX TO CULTURE (INFECTION SUSPECTED)
Bacteria, UA: NONE SEEN
Bilirubin Urine: NEGATIVE
Glucose, UA: NEGATIVE mg/dL
Hgb urine dipstick: NEGATIVE
Ketones, ur: 20 mg/dL — AB
Leukocytes,Ua: NEGATIVE
Nitrite: NEGATIVE
Protein, ur: 30 mg/dL — AB
Specific Gravity, Urine: 1.025 (ref 1.005–1.030)
pH: 5 (ref 5.0–8.0)

## 2023-04-29 LAB — RESP PANEL BY RT-PCR (RSV, FLU A&B, COVID)  RVPGX2
Influenza A by PCR: NEGATIVE
Influenza B by PCR: NEGATIVE
Resp Syncytial Virus by PCR: NEGATIVE
SARS Coronavirus 2 by RT PCR: NEGATIVE

## 2023-04-29 LAB — URINE DRUG SCREEN, QUALITATIVE (ARMC ONLY)
Amphetamines, Ur Screen: NOT DETECTED
Barbiturates, Ur Screen: NOT DETECTED
Benzodiazepine, Ur Scrn: NOT DETECTED
Cannabinoid 50 Ng, Ur ~~LOC~~: NOT DETECTED
Cocaine Metabolite,Ur ~~LOC~~: NOT DETECTED
MDMA (Ecstasy)Ur Screen: NOT DETECTED
Methadone Scn, Ur: NOT DETECTED
Opiate, Ur Screen: NOT DETECTED
Phencyclidine (PCP) Ur S: NOT DETECTED
Tricyclic, Ur Screen: NOT DETECTED

## 2023-04-29 LAB — C DIFFICILE QUICK SCREEN W PCR REFLEX
C Diff antigen: NEGATIVE
C Diff interpretation: NOT DETECTED
C Diff toxin: NEGATIVE

## 2023-04-29 LAB — PROTIME-INR
INR: 1.1 (ref 0.8–1.2)
Prothrombin Time: 14.1 s (ref 11.4–15.2)

## 2023-04-29 LAB — CBG MONITORING, ED
Glucose-Capillary: 139 mg/dL — ABNORMAL HIGH (ref 70–99)
Glucose-Capillary: 124 mg/dL — ABNORMAL HIGH (ref 70–99)

## 2023-04-29 LAB — VITAMIN B12: Vitamin B-12: 777 pg/mL (ref 180–914)

## 2023-04-29 LAB — APTT: aPTT: 31 s (ref 24–36)

## 2023-04-29 LAB — MAGNESIUM: Magnesium: 1.6 mg/dL — ABNORMAL LOW (ref 1.7–2.4)

## 2023-04-29 MED ORDER — SENNOSIDES-DOCUSATE SODIUM 8.6-50 MG PO TABS: 1.0000 | ORAL_TABLET | Freq: Every evening | ORAL | Status: DC | PRN

## 2023-04-29 MED ORDER — SODIUM CHLORIDE 0.9 % IV BOLUS
1000.0000 mL | Freq: Once | INTRAVENOUS | Status: AC
  Administered 2023-04-29: 1000 mL via INTRAVENOUS

## 2023-04-29 MED ORDER — ACETAMINOPHEN 650 MG RE SUPP: 650.0000 mg | RECTAL | Status: DC | PRN

## 2023-04-29 MED ORDER — ONDANSETRON HCL 4 MG/2ML IJ SOLN
4.0000 mg | Freq: Once | INTRAMUSCULAR | Status: AC
  Administered 2023-04-29: 4 mg via INTRAVENOUS
  Filled 2023-04-29: qty 2

## 2023-04-29 MED ORDER — FLUTICASONE PROPIONATE 50 MCG/ACT NA SUSP: 1.0000 | Freq: Every day | NASAL | Status: DC | PRN

## 2023-04-29 MED ORDER — SIMVASTATIN 20 MG PO TABS
20.0000 mg | ORAL_TABLET | Freq: Every evening | ORAL | Status: DC
  Administered 2023-04-29 – 2023-05-05 (×7): 20 mg via ORAL
  Filled 2023-04-29: qty 2
  Filled 2023-04-29 (×2): qty 1
  Filled 2023-04-29: qty 2
  Filled 2023-04-29 (×4): qty 1

## 2023-04-29 MED ORDER — IOHEXOL 300 MG/ML  SOLN
100.0000 mL | Freq: Once | INTRAMUSCULAR | Status: AC | PRN
  Administered 2023-04-29: 100 mL via INTRAVENOUS

## 2023-04-29 MED ORDER — PANTOPRAZOLE SODIUM 40 MG PO TBEC
80.0000 mg | DELAYED_RELEASE_TABLET | Freq: Every day | ORAL | Status: DC
  Administered 2023-04-29 – 2023-05-05 (×7): 80 mg via ORAL
  Filled 2023-04-29 (×7): qty 2

## 2023-04-29 MED ORDER — SODIUM CHLORIDE 0.9 % IV BOLUS
500.0000 mL | Freq: Once | INTRAVENOUS | Status: AC
Start: 2023-04-29 — End: 2023-04-29
  Administered 2023-04-29: 500 mL via INTRAVENOUS

## 2023-04-29 MED ORDER — INSULIN ASPART 100 UNIT/ML IJ SOLN
0.0000 [IU] | Freq: Every day | INTRAMUSCULAR | Status: DC
Start: 2023-04-29 — End: 2023-05-06

## 2023-04-29 MED ORDER — STROKE: EARLY STAGES OF RECOVERY BOOK: Freq: Once | Status: DC

## 2023-04-29 MED ORDER — ACETAMINOPHEN 325 MG PO TABS: 650.0000 mg | ORAL_TABLET | ORAL | Status: DC | PRN

## 2023-04-29 MED ORDER — ACETAMINOPHEN 160 MG/5ML PO SOLN: 650.0000 mg | ORAL | Status: DC | PRN

## 2023-04-29 MED ORDER — ACETAMINOPHEN 325 MG PO TABS
650.0000 mg | ORAL_TABLET | Freq: Once | ORAL | Status: AC
Start: 2023-04-29 — End: 2023-04-29
  Administered 2023-04-29: 650 mg via ORAL
  Filled 2023-04-29: qty 2

## 2023-04-29 MED ORDER — METOPROLOL TARTRATE 25 MG PO TABS
25.0000 mg | ORAL_TABLET | Freq: Two times a day (BID) | ORAL | Status: DC
  Administered 2023-04-29 – 2023-05-05 (×11): 25 mg via ORAL
  Filled 2023-04-29 (×13): qty 1

## 2023-04-29 MED ORDER — INSULIN ASPART 100 UNIT/ML IJ SOLN
0.0000 [IU] | Freq: Three times a day (TID) | INTRAMUSCULAR | Status: DC
  Administered 2023-04-29 – 2023-05-06 (×7): 2 [IU] via SUBCUTANEOUS
  Filled 2023-04-29 (×8): qty 1

## 2023-04-29 NOTE — ED Provider Notes (Signed)
 [facility] Provider Note    Event Date/Time   First MD Initiated Contact with Patient 04/29/23 [phone]     (approximate)   History   Fall   HPI  Brandon Clements is a 83 y.o. male who presents to the ER for evaluation of weakness.  Patient has a history of neuropathy.  Was getting up to use the bathroom this morning did vomit 1 was feeling like his legs were just giving out from him and was not able to stand.  Required assistance from his son who lives at home with them.  Denies any abdominal pain no cough no congestion no rashes.  Other than fatigue and weakness he denies any other symptoms.  He did not fall or hit his head.     Physical Exam   Triage Vital Signs: ED Triage Vitals  Encounter Vitals Group     BP 04/29/23 0544 (!) 162/82     Systolic BP Percentile --      Diastolic BP Percentile --      Pulse Rate 04/29/23 0544 97     Resp 04/29/23 0544 18     Temp 04/29/23 0544 98.6 F (37 C)     Temp Source 04/29/23 0544 Oral     SpO2 04/29/23 0544 98 %     Weight --      Height --      Head Circumference --      Peak Flow --      Pain Score 04/29/23 0540 0     Pain Loc --      Pain Education --      Exclude from Growth Chart --     Most recent vital signs: Vitals:   04/29/23 0930 04/29/23 1000  BP: (!) 144/67 132/75  Pulse: 82 87  Resp: 16 16  Temp:    SpO2: 96% 96%     Constitutional: Alert  Eyes: Conjunctivae are normal.  Head: Atraumatic. Nose: No congestion/rhinnorhea. Mouth/Throat: Mucous membranes are moist.   Neck: Painless ROM.  Cardiovascular:   Good peripheral circulation. Respiratory: Normal respiratory effort.  No retractions.  Gastrointestinal: Soft and nontender in all four quadrants Musculoskeletal:  no deformity Neurologic:  MAE spontaneously. No gross focal neurologic deficits are appreciated.  Skin:  Skin is warm, dry and intact. No rash noted. Psychiatric: Mood and affect are normal. Speech and behavior are  normal.    ED Results / Procedures / Treatments   Labs (all labs ordered are listed, but only abnormal results are displayed) Labs Reviewed  CBC WITH DIFFERENTIAL/PLATELET - Abnormal; Notable for the following components:      Result Value   WBC 12.8 (*)    Neutro Abs 11.8 (*)    Lymphs Abs 0.3 (*)    All other components within normal limits  COMPREHENSIVE METABOLIC PANEL - Abnormal; Notable for the following components:   Potassium 3.4 (*)    Glucose, Bld 220 (*)    Total Bilirubin 1.5 (*)    All other components within normal limits  URINALYSIS, W/ REFLEX TO CULTURE (INFECTION SUSPECTED) - Abnormal; Notable for the following components:   Color, Urine YELLOW (*)    APPearance HAZY (*)    Ketones, ur 20 (*)    Protein, ur 30 (*)    All other components within normal limits  RESP PANEL BY RT-PCR (RSV, FLU A&B, COVID)  [id number]  CULTURE, BLOOD (ROUTINE X 2)  CULTURE, BLOOD (ROUTINE X 2)  GASTROINTESTINAL PANEL  BY PCR, STOOL (REPLACES STOOL CULTURE)  C DIFFICILE QUICK SCREEN W PCR REFLEX    LACTIC ACID, PLASMA  LACTIC ACID, PLASMA  PROTIME-INR  APTT  TROPONIN I (HIGH SENSITIVITY)       RADIOLOGY Please see ED Course for my review and interpretation.  I personally reviewed all radiographic images ordered to evaluate for the above acute complaints and reviewed radiology reports and findings.  These findings were personally discussed with the patient.  Please see medical record for radiology report.    PROCEDURES:  Critical Care performed: No  Procedures   MEDICATIONS ORDERED IN ED: Medications  sodium chloride  0.9 % bolus 1,000 mL (0 mLs Intravenous Stopped 04/29/23 1000)  acetaminophen  (TYLENOL ) tablet 650 mg (650 mg Oral Given 04/29/23 0814)  iohexol  (OMNIPAQUE ) 300 MG/ML solution 100 mL (100 mLs Intravenous Contrast Given 04/29/23 1029)  ondansetron  (ZOFRAN ) injection 4 mg (4 mg Intravenous Given 04/29/23 1129)  sodium chloride  0.9 % bolus 500 mL (500 mLs  Intravenous New Bag/Given 04/29/23 1316)     IMPRESSION / MDM / ASSESSMENT AND PLAN / ED COURSE  I reviewed the triage vital signs and the nursing notes.                              Differential diagnosis includes, but is not limited to, sepsis, UTI, viral illness, pneumonia, dehydration, electrolyte abnormality  Patient presenting to the ER for evaluation of symptoms as described above.  Based on symptoms, risk factors and considered above differential, this presenting complaint could reflect a potentially life-threatening illness therefore the patient will be placed on continuous pulse oximetry and telemetry for monitoring.  Laboratory evaluation will be sent to evaluate for the above complaints.      Clinical Course as of 04/29/23 1424  Mon Apr 29, 2023  9188 Chest x-ray on my review and interpretation without evidence of consolidation or pneumothorax. [PR]  [phone] Patient receiving IV fluids. [PR]  0959 Lactate is 4 fortunately normal.  Given vomiting episode leukocytosis age and risk factors will order CT imaging of the abdomen. [PR]  1036 Urinalysis without evidence of infection. [PR]  1109 CT imaging without evidence of obstruction findings concerning for possible enteritis.  Given recent exposure to norovirus could be etiology of the patient's symptoms.  Will p.o. challenge check orthostatics after receiving IV fluids. [PR]  1320 Patient unable to stand due to significant weakness.  I do think that most of his symptoms are likely secondary to norovirus.  Patient will require admission to the hospital. [PR]    Clinical Course User Index [PR] Lang Dover, MD     FINAL CLINICAL IMPRESSION(S) / ED DIAGNOSES   Final diagnoses:  Nausea and vomiting, unspecified vomiting type  Orthostasis  Enteritis     Rx / DC Orders   ED Discharge Orders     None        Note:  This document was prepared using Dragon voice recognition software and may include unintentional  dictation errors.    Lang Dover, MD 04/29/23 [phone]

## 2023-04-29 NOTE — ED Triage Notes (Addendum)
 Patient here with wife c/o multiple falls this AM while trying to go to the bathroom d/t leg weakness.  Patient has neuropathy and states his legs are normally weak.  Patient's wife stated that patient has had episodes of vomiting as well after the falls.  Patient's wife states patient did not hit head in falls, did not have LOC.  Patient denies being on blood thinners. Patient's wife states that patient's family member has been exposed to norovirus.

## 2023-04-29 NOTE — Hospital Course (Signed)
 Brandon Clements is a 83 year old male with history of hypertension, hyperlipidemia, GERD, non-insulin -dependent diabetes mellitus, who presents emergency department for chief concerns of weakness in the bilateral legs and multiple falls.  Vitals in the ED showed temperature of 98.6, respiration rate of 18, heart rate 97, blood pressure 162/82, SpO2 of 98% on room air.  While in the ED, patient developed fever of 100.6.  Serum sodium is 138, potassium 3.4, chloride 100, bicarb 24, BUN of 11, serum creatinine 0.88, EGFR greater than 60, nonfasting blood glucose 220, WBC 12.8, hemoglobin 15.2, platelets of 152.  COVID/influenza A/influenza B/RSV PCR were negative.  Lactic acid was 1.7 and on repeat is 1.5.  Blood cultures x 2 have been collected and are in process.  CT abdomen pelvis w cm: Was read as multiple fluid-filled loops of small and large bowel with a few mildly dilated loops of small bowel in the central abdomen.  Findings are nonspecific but could reflect enteritis or ileus.  No evidence of high-grade bowel obstruction or perforation. New indeterminate, partially solid, noncalcified nodule in the midline omentum, possibly necrotic lymph node.  This could be atypical or metastatic head/neck cancer.  Consider further CT PET/CT or abdominal CT in 3 to 6 months.  Cholelithiasis without evidence of cholecystitis or biliary ductal dilatation.  CT head wo contrast: Read as no acute or traumatic finding, generalized atrophy.  Metallic density foreign object in the region of the right upper orbit on the left.  ED treatment: Acetaminophen  650 mg p.o. one-time dose, ondansetron  4 mg IV one-time dose.  Sodium chloride  1.5 L bolus.

## 2023-04-29 NOTE — Assessment & Plan Note (Signed)
 Home metoprolol tartrate 25 mg p.o. twice daily resumed

## 2023-04-29 NOTE — ED Notes (Signed)
 This RN attempted to obtain orthostatic vital signs. Patient struggled to sit up, unable to stand.  Lying: HR 83 BP 121/72 Sitting: HR 99 BP 143/90  Roxan Hockey, MD, made aware.

## 2023-04-29 NOTE — ED Notes (Addendum)
 At this time, pt assisted to [facility] w/ RN and NT via wheelchair for BM. Pt required extensive help/support d/t weakness. Stool sample obtained and pt placed in brief, linens changed. Stool was loose and brown.

## 2023-04-29 NOTE — Assessment & Plan Note (Signed)
 Home glimepiride will not resumed on admission Insulin SSI with at bedtime coverage ordered

## 2023-04-29 NOTE — ED Notes (Signed)
 Pt very unsteady. Has to get up to bathroom with x2 assist. Pt unable to follow commands well when transferring.

## 2023-04-29 NOTE — Assessment & Plan Note (Signed)
 Patient reports he is no longer taking aspirin Home simvastatin 20 mg every evening resumed

## 2023-04-29 NOTE — Assessment & Plan Note (Addendum)
 Per CT read: New indeterminate, partially solid noncalcified nodule in the midline omentum, possibly a necrotic lymph node. Recommend outpatient follow-up PET/CT or abdominal CT in 3 to 6 months outpatient

## 2023-04-29 NOTE — ED Notes (Signed)
Rainbow of tubes sent to lab.

## 2023-04-29 NOTE — H&P (Signed)
 History and Physical   Brandon Clements DOB: [birth year] DOA: 04/29/2023  PCP: Auston Reyes BIRCH, MD  Outpatient Specialists: Dr. [facility] Patient coming from: Home  I have personally briefly reviewed patient's old medical records in [facility] EMR.  Chief Concern: Multiple falls  HPI: Brandon Clements is a 83 year old male with history of hypertension, hyperlipidemia, GERD, non-insulin -dependent diabetes mellitus, who presents emergency department for chief concerns of weakness in the bilateral legs and multiple falls.  Vitals in the ED showed temperature of 98.6, respiration rate of 18, heart rate 97, blood pressure 162/82, SpO2 of 98% on room air.  While in the ED, patient developed fever of 100.6.  Serum sodium is 138, potassium 3.4, chloride 100, bicarb 24, BUN of 11, serum creatinine 0.88, EGFR greater than 60, nonfasting blood glucose 220, WBC 12.8, hemoglobin 15.2, platelets of 152.  COVID/influenza A/influenza B/RSV PCR were negative.  Lactic acid was 1.7 and on repeat is 1.5.  Blood cultures x 2 have been collected and are in process.  CT abdomen pelvis w cm: Was read as multiple fluid-filled loops of small and large bowel with a few mildly dilated loops of small bowel in the central abdomen.  Findings are nonspecific but could reflect enteritis or ileus.  No evidence of high-grade bowel obstruction or perforation. New indeterminate, partially solid, noncalcified nodule in the midline omentum, possibly necrotic lymph node.  This could be atypical or metastatic head/neck cancer.  Consider further CT PET/CT or abdominal CT in 3 to 6 months.  Cholelithiasis without evidence of cholecystitis or biliary ductal dilatation.  CT head wo contrast: Read as no acute or traumatic finding, generalized atrophy.  Metallic density foreign object in the region of the right upper orbit on the left.  ED treatment: Acetaminophen  650 mg p.o. one-time dose,  ondansetron  4 mg IV one-time dose.  Sodium chloride  1.5 L bolus. ------------------------------------- At bedside, patient able to tell me his name, age, location, current calendar year.  Spouse reports that he has had multiple falls over the last few days.  Spouse reports that their grandson whom they have been exposed to was recently diagnosed with norovirus.  Patient reports that he has had 1 episode of loose or diarrhea yesterday and while he is in the ED.  He denies trauma to his person.  He denies changes to appetite or diet recently.  He endorses 1 episode of vomiting prior to ED presentation.  He reports no recent antibiotic use.  Social history: Lives at home with his wife.  He denies tobacco, EtOH, recreational drug use.  He is retired.  ROS: Constitutional: no weight change, no fever ENT/Mouth: no sore throat, no rhinorrhea Eyes: no eye pain, no vision changes Cardiovascular: no chest pain, no dyspnea,  no edema, no palpitations Respiratory: no cough, no sputum, no wheezing Gastrointestinal: + nausea, + vomiting, + diarrhea, no constipation Genitourinary: no urinary incontinence, no dysuria, no hematuria Musculoskeletal: no arthralgias, no myalgias Skin: no skin lesions, no pruritus, Neuro: + weakness, no loss of consciousness, no syncope Psych: no anxiety, no depression, + decrease appetite Heme/Lymph: no bruising, no bleeding  ED Course: Discussed with EDP, patient requiring hospitalization for chief concerns of acute onset of worsening bilateral lower extremity weakness and multiple falls.  Assessment/Plan  Principal Problem:   Weakness Active Problems:   Coronary atherosclerosis   Esophagitis   GERD   Benign neoplasm of transverse colon   Benign neoplasm of ascending colon   Omental mass  Diabetes mellitus type 2, noninsulin dependent (HCC)   Essential hypertension   Assessment and Plan:  * Weakness Patient has history of B12 deficiency Check B12 and  magnesium  level on admission Recommend a.m. team to consult neurology as appropriate if patient symptoms do not improve and B12 and magnesium  level are negative A possible repeat CT of the head in the morning can be indicated as MRI cannot be ordered on admission due to metallic foreign body noted on CT imaging near the region of the upper orbit on the left Fall precaution Admit to telemetry medical, inpatient  Essential hypertension Home metoprolol  tartrate 25 mg p.o. twice daily resumed  Diabetes mellitus type 2, noninsulin dependent (HCC) Home glimepiride will not resumed on admission Insulin  SSI with at bedtime coverage ordered  Omental mass Per CT read: New indeterminate, partially solid noncalcified nodule in the midline omentum, possibly a necrotic lymph node. Recommend outpatient follow-up PET/CT or abdominal CT in 3 to 6 months outpatient  GERD Home PPI equivalent dosing resumed  Coronary atherosclerosis Patient reports he is no longer taking aspirin Home simvastatin  20 mg every evening resumed  Chart reviewed.   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   10/2010, squamous cell cancer  right jaw 2012     Colon polyp    adenomatous   GERD (gastroesophageal reflux disease)    Hiatal hernia    Hx of radiation therapy 2011   left cheek, [place] [facility]   Hx of radiation therapy 12/30/13-01/26/14   scalp 5000 cGy in 20 sessions   Hyperlipidemia    Hypertension    Kidney stones    Myocardial  infarction [facility]) 2005   mild   Skin cancer 11/2013   right frontal scalp, squamous cell carcinoma, history basal cell ca   Status post dilation of esophageal narrowing    Past Surgical History:  Procedure Laterality Date   ANGIOPLASTY  2005   3 stents   BIOPSY  12/22/2019   Procedure: BIOPSY;  Surgeon: Abran Norleen SAILOR, MD;  Location: [facility];  Service: Endoscopy;;   Cancer Sugery     Skin cancer   cardiac stent     x3   COLONOSCOPY  2010, 2006   hx adenomatous polyps 2006/ Dr. Abran   COLONOSCOPY N/A 12/07/2014   Procedure: COLONOSCOPY;  Surgeon: Norleen SAILOR Abran, MD;  Location: [facility];  Service: Endoscopy;  Laterality: N/A;   COLONOSCOPY WITH PROPOFOL  N/A 12/22/2019   Procedure: COLONOSCOPY WITH PROPOFOL ;  Surgeon: Abran Norleen SAILOR, MD;  Location: [facility];  Service: Endoscopy;  Laterality: N/A;   ESOPHAGOGASTRODUODENOSCOPY N/A 12/07/2014   Procedure: ESOPHAGOGASTRODUODENOSCOPY (EGD);  Surgeon: Norleen SAILOR Abran, MD;  Location: [facility];  Service: Endoscopy;  Laterality: N/A;   ESOPHAGOGASTRODUODENOSCOPY (EGD) WITH PROPOFOL  N/A 12/22/2019   Procedure: ESOPHAGOGASTRODUODENOSCOPY (EGD) WITH PROPOFOL ;  Surgeon: Abran Norleen SAILOR, MD;  Location: [facility];  Service: Endoscopy;  Laterality: N/A;   EXTERNAL EAR SURGERY Left    left ear removal   NECK DISSECTION  Feb 2012  right   POLYPECTOMY  12/22/2019   Procedure: POLYPECTOMY;  Surgeon: Abran Norleen SAILOR, MD;  Location: [facility];  Service: Endoscopy;;   RADICAL NECK DISSECTION  July 2012   left neck, jaw, and ear   Social History:  reports that he has quit smoking. His smokeless tobacco use includes chew. He reports current alcohol use of about 14.0 standard drinks of alcohol per week. He reports that he does not use drugs.  No Known Allergies Family History  Problem Relation Age of Onset   Skin cancer Father    Bladder Cancer Father    Alzheimer's disease Mother    Colon cancer Neg Hx    Stomach cancer Neg Hx    Colon  polyps Neg Hx    Esophageal cancer Neg Hx    Kidney disease Neg Hx    Gallbladder disease Neg Hx    Diabetes Neg Hx    Family history: Family history reviewed and not pertinent.  Prior to Admission medications   Medication Sig Start Date End Date Taking? Authorizing Provider  acetaminophen  (TYLENOL ) 500 MG tablet Take 500-1,000 mg by mouth every 8 (eight) hours as needed for moderate pain.    [provider]  aspirin 81 MG tablet Take 81 mg by mouth at bedtime.     [provider]  fluticasone  (FLONASE ) 50 MCG/ACT nasal spray Place 1 spray into the nose daily as needed for allergies or rhinitis.     [provider]  glimepiride (AMARYL) 2 MG tablet Take 2 mg by mouth daily with breakfast.  10/26/19   [provider]  metoprolol  tartrate (LOPRESSOR ) 25 MG tablet Take 25 mg by mouth 2 (two) times daily.  11/02/11   [provider]  omeprazole (PRILOSEC) 40 MG capsule Take 40 mg by mouth at bedtime.    [provider]  simvastatin  (ZOCOR ) 20 MG tablet Take 20 mg by mouth every evening.  11/02/11   [provider]  triamcinolone cream (KENALOG) 0.5 % Apply 1 application topically 2 (two) times daily as needed (cancerous spots).     [provider]   Physical Exam: Vitals:   04/29/23 2000 04/29/23 2002 04/29/23 2015 04/29/23 2030  BP: 133/69   135/72  Pulse: 74   78  Resp: 20   16  Temp:      TempSrc:      SpO2: (!) 89% 95% 96%    Constitutional: appears frail, NAD, calm Eyes: PERRL, lids and conjunctivae normal ENMT: Mucous membranes are moist. Posterior pharynx clear of any exudate or lesions. Age-appropriate dentition. Hearing appropriate Neck: normal, supple, no masses, no thyromegaly Respiratory: clear to auscultation bilaterally, no wheezing, no crackles. Normal respiratory effort. No accessory muscle use.  Cardiovascular: Regular rate and rhythm, no murmurs / rubs / gallops. No extremity edema. 2+ pedal pulses. No  carotid bruits.  Abdomen: no tenderness, no masses palpated, no hepatosplenomegaly. Bowel sounds positive.  Musculoskeletal: no clubbing / cyanosis. No joint deformity upper and lower extremities. Good ROM, no contractures, no atrophy. Normal muscle tone.  Skin: no rashes, lesions, ulcers. No induration Neurologic: Sensation intact. Strength 5/5 in all 4.  Psychiatric: Normal judgment and insight. Alert and oriented x 3. Normal mood.   EKG: Ordered on admission, pending completion  Chest x-ray on Admission: I personally reviewed and I agree with radiologist reading as below.  CT HEAD WO CONTRAST ( ) Result Date: 04/29/2023 CLINICAL DATA:  Syncope/presyncope.  Multiple falls. EXAM: CT HEAD WITHOUT CONTRAST TECHNIQUE: Contiguous axial  images were obtained from the base of the skull through the vertex without intravenous contrast. RADIATION DOSE REDUCTION: This exam was performed according to the departmental dose-optimization program which includes automated exposure control, adjustment of the mA and/or kV according to patient size and/or use of iterative reconstruction technique. COMPARISON:  07/13/2011 FINDINGS: Brain: Generalized atrophy. No evidence of focal infarction, mass lesion, hemorrhage, hydrocephalus or extra-axial collection. Vascular: No abnormal vascular finding. Skull: Extensive resection of the temporal bone, skull base and mandible on the left. Sinuses/Orbits: Clear sinuses. Metallic density foreign object in the region of the upper orbit on the left. Other: None IMPRESSION: 1. No acute or traumatic finding. Generalized atrophy. 2. 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 Metallic density foreign object in the region of the upper orbit on the left. Electronically Signed   By: Oneil Officer M.D.   On: 04/29/2023 13:55   CT ABDOMEN PELVIS W CONTRAST Result Date: 04/29/2023 CLINICAL DATA:  Sepsis, multiple falls, weakness. History of head and neck  cancer. EXAM: CT ABDOMEN AND PELVIS WITH CONTRAST TECHNIQUE: Multidetector CT imaging of the abdomen and pelvis was performed using the standard protocol following bolus administration of intravenous contrast. RADIATION DOSE REDUCTION: This exam was performed according to the departmental dose-optimization program which includes automated exposure control, adjustment of the mA and/or kV according to patient size and/or use of iterative reconstruction technique. CONTRAST:  OMNIPAQUE  IOHEXOL  300 MG/ML  SOLN COMPARISON:  PET-CT 09/22/2010.  Lumbar MRI 02/28/2023. FINDINGS: Lower chest: Clear lung bases. No significant pleural or pericardial effusion. Aortic and coronary artery atherosclerosis with a moderate size hiatal hernia. Hepatobiliary: The liver is normal in density without suspicious focal abnormality. Single calcified gallstone. No evidence of gallbladder wall thickening, surrounding inflammation or biliary ductal dilatation. Pancreas: Unremarkable. No pancreatic ductal dilatation or surrounding inflammatory changes. Spleen: Normal in size without focal abnormality. Adrenals/Urinary Tract: Both adrenal glands appear normal. No evidence of urinary tract calculus, suspicious renal lesion or hydronephrosis. The bladder appears unremarkable for its degree of distention. Stomach/Bowel: No enteric contrast administered. As above, moderate-size hiatal hernia. The stomach otherwise appears unremarkable for its degree of distention. There are multiple fluid-filled loops of small and large bowel. There are few mildly dilated loops of small bowel in the central abdomen, without focal transition point, wall thickening or surrounding inflammation. The appendix appears normal. Vascular/Lymphatic: There is a new partially solid, noncalcified nodule in the midline omentum measuring 2.1 x 1.6 cm on image 49/2, possibly a necrotic lymph node. No other enlarged abdominopelvic lymph nodes are identified. Aortic and branch  vessel atherosclerosis without evidence of aneurysm or large vessel occlusion. Reproductive: Mild enlargement of the prostate gland with mild central heterogeneity. Other: Small umbilical hernia containing only fat. Asymmetric fat in the right inguinal canal. No ascites or generalized peritoneal nodularity. Musculoskeletal: No acute or significant osseous findings. Mild spondylosis. IMPRESSION: 1. Multiple fluid-filled loops of small and large bowel with a few mildly dilated loops of small bowel in the central abdomen. Findings are nonspecific but could reflect enteritis or ileus. No evidence of high-grade bowel obstruction or perforation. 2. New indeterminate, partially solid, noncalcified nodule in the midline omentum, possibly a necrotic lymph node. This would be atypical for metastatic head and neck cancer. Consider further evaluation with PET-CT or abdominal CT follow-up in 3-6 months. 3. Moderate-size hiatal hernia. 4. Cholelithiasis without evidence of cholecystitis or biliary ductal dilatation. 5.  Aortic Atherosclerosis (ICD10-I70.0). Electronically Signed   By: Elsie  Gertrude M.D.   On: 04/29/2023 11:04   DG Chest Port 1 View Result Date: 04/29/2023 CLINICAL DATA:  Multiple falls.  Vomiting. EXAM: PORTABLE CHEST 1 VIEW COMPARISON:  11/21/2006 FINDINGS: The lungs are clear without focal pneumonia, edema, pneumothorax or pleural effusion. Streaky opacity in the retrocardiac left base suggests atelectasis. Cardiopericardial silhouette is at upper limits of normal for size. No acute bony abnormality. Degenerative changes noted in both shoulders. IMPRESSION: Streaky opacity in the retrocardiac left base suggests atelectasis. Otherwise no acute findings. Electronically Signed   By: Camellia Candle M.D.   On: 04/29/2023 07:56   Labs on Admission: I have personally reviewed following labs  CBC: Recent Labs  Lab 04/29/23 0543  WBC 12.8*  NEUTROABS 11.8*  HGB 15.2  HCT 45.0  MCV 95.9  PLT 159   Basic  Metabolic Panel: Recent Labs  Lab 04/29/23 0543 04/29/23 1706  NA 138  --   K 3.4*  --   CL 100  --   CO2 25  --   GLUCOSE 220*  --   BUN 11  --   CREATININE 0.88  --   CALCIUM 9.2  --   MG  --  1.6*   GFR: CrCl cannot be calculated (Unknown ideal weight.).  Liver Function Tests: Recent Labs  Lab 04/29/23 0543  AST 22  ALT 15  ALKPHOS 59  BILITOT 1.5*  PROT 7.3  ALBUMIN 4.0   Coagulation Profile: Recent Labs  Lab 04/29/23 0752  INR 1.1   Urine analysis:    Component Value Date/Time   COLORURINE YELLOW (A) 04/29/2023 0752   APPEARANCEUR HAZY (A) 04/29/2023 0752   LABSPEC 1.025 04/29/2023 0752   PHURINE 5.0 04/29/2023 0752   GLUCOSEU NEGATIVE 04/29/2023 0752   HGBUR NEGATIVE 04/29/2023 0752   BILIRUBINUR NEGATIVE 04/29/2023 0752   KETONESUR 20 (A) 04/29/2023 0752   PROTEINUR 30 (A) 04/29/2023 0752   NITRITE NEGATIVE 04/29/2023 0752   LEUKOCYTESUR NEGATIVE 04/29/2023 0752   This document was prepared using Dragon Voice Recognition software and may include unintentional dictation errors.  Dr. Sherre [facility]  If 7PM-7AM, please contact overnight-coverage provider If 7AM-7PM, please contact day attending provider [email or web address]  04/29/2023, 10:45 PM

## 2023-04-29 NOTE — Assessment & Plan Note (Addendum)
 Patient has history of B12 deficiency Check B12 and magnesium  level on admission Recommend a.m. team to consult neurology as appropriate if patient symptoms do not improve and B12 and magnesium  level are negative A possible repeat CT of the head in the morning can be indicated as MRI cannot be ordered on admission due to metallic foreign body noted on CT imaging near the region of the upper orbit on the left Fall precaution Admit to telemetry medical, inpatient

## 2023-04-29 NOTE — Assessment & Plan Note (Signed)
 Home PPI equivalent dosing resumed

## 2023-04-30 DIAGNOSIS — R296 Repeated falls: Secondary | ICD-10-CM | POA: Diagnosis not present

## 2023-04-30 DIAGNOSIS — Z923 Personal history of irradiation: Secondary | ICD-10-CM | POA: Diagnosis not present

## 2023-04-30 DIAGNOSIS — K8020 Calculus of gallbladder without cholecystitis without obstruction: Secondary | ICD-10-CM | POA: Diagnosis not present

## 2023-04-30 DIAGNOSIS — R531 Weakness: Secondary | ICD-10-CM | POA: Diagnosis not present

## 2023-04-30 DIAGNOSIS — R278 Other lack of coordination: Secondary | ICD-10-CM | POA: Diagnosis not present

## 2023-04-30 DIAGNOSIS — R262 Difficulty in walking, not elsewhere classified: Secondary | ICD-10-CM | POA: Diagnosis not present

## 2023-04-30 DIAGNOSIS — E1140 Type 2 diabetes mellitus with diabetic neuropathy, unspecified: Secondary | ICD-10-CM | POA: Diagnosis not present

## 2023-04-30 DIAGNOSIS — R29898 Other symptoms and signs involving the musculoskeletal system: Secondary | ICD-10-CM | POA: Diagnosis present

## 2023-04-30 DIAGNOSIS — K529 Noninfective gastroenteritis and colitis, unspecified: Secondary | ICD-10-CM

## 2023-04-30 DIAGNOSIS — E860 Dehydration: Secondary | ICD-10-CM | POA: Diagnosis not present

## 2023-04-30 DIAGNOSIS — Z9181 History of falling: Secondary | ICD-10-CM | POA: Diagnosis not present

## 2023-04-30 DIAGNOSIS — R112 Nausea with vomiting, unspecified: Secondary | ICD-10-CM | POA: Diagnosis not present

## 2023-04-30 DIAGNOSIS — M795 Residual foreign body in soft tissue: Secondary | ICD-10-CM | POA: Diagnosis not present

## 2023-04-30 DIAGNOSIS — Z79899 Other long term (current) drug therapy: Secondary | ICD-10-CM | POA: Diagnosis not present

## 2023-04-30 DIAGNOSIS — I2510 Atherosclerotic heart disease of native coronary artery without angina pectoris: Secondary | ICD-10-CM | POA: Diagnosis not present

## 2023-04-30 DIAGNOSIS — M6259 Muscle wasting and atrophy, not elsewhere classified, multiple sites: Secondary | ICD-10-CM | POA: Diagnosis not present

## 2023-04-30 DIAGNOSIS — R2681 Unsteadiness on feet: Secondary | ICD-10-CM | POA: Diagnosis not present

## 2023-04-30 DIAGNOSIS — E876 Hypokalemia: Secondary | ICD-10-CM | POA: Diagnosis not present

## 2023-04-30 DIAGNOSIS — M6281 Muscle weakness (generalized): Secondary | ICD-10-CM | POA: Diagnosis not present

## 2023-04-30 DIAGNOSIS — A0811 Acute gastroenteropathy due to Norwalk agent: Secondary | ICD-10-CM | POA: Diagnosis not present

## 2023-04-30 DIAGNOSIS — I10 Essential (primary) hypertension: Secondary | ICD-10-CM | POA: Diagnosis not present

## 2023-04-30 DIAGNOSIS — K2100 Gastro-esophageal reflux disease with esophagitis, without bleeding: Secondary | ICD-10-CM | POA: Diagnosis not present

## 2023-04-30 DIAGNOSIS — E785 Hyperlipidemia, unspecified: Secondary | ICD-10-CM | POA: Diagnosis not present

## 2023-04-30 DIAGNOSIS — Z7984 Long term (current) use of oral hypoglycemic drugs: Secondary | ICD-10-CM | POA: Diagnosis not present

## 2023-04-30 DIAGNOSIS — I252 Old myocardial infarction: Secondary | ICD-10-CM | POA: Diagnosis not present

## 2023-04-30 DIAGNOSIS — A084 Viral intestinal infection, unspecified: Secondary | ICD-10-CM | POA: Diagnosis not present

## 2023-04-30 DIAGNOSIS — D484 Neoplasm of uncertain behavior of peritoneum: Secondary | ICD-10-CM | POA: Diagnosis not present

## 2023-04-30 DIAGNOSIS — Z85828 Personal history of other malignant neoplasm of skin: Secondary | ICD-10-CM | POA: Diagnosis not present

## 2023-04-30 DIAGNOSIS — Z87442 Personal history of urinary calculi: Secondary | ICD-10-CM | POA: Diagnosis not present

## 2023-04-30 DIAGNOSIS — Z860101 Personal history of adenomatous and serrated colon polyps: Secondary | ICD-10-CM | POA: Diagnosis not present

## 2023-04-30 DIAGNOSIS — R410 Disorientation, unspecified: Secondary | ICD-10-CM | POA: Diagnosis not present

## 2023-04-30 DIAGNOSIS — Z1152 Encounter for screening for COVID-19: Secondary | ICD-10-CM | POA: Diagnosis not present

## 2023-04-30 LAB — LIPID PANEL
Cholesterol: 116 mg/dL (ref 0–200)
HDL: 51 mg/dL (ref >40)
LDL Cholesterol: 52 mg/dL (ref 0–99)
Total CHOL/HDL Ratio: 2.3 {ratio}
Triglycerides: 65 mg/dL (ref <150)
VLDL: 13 mg/dL (ref 0–40)

## 2023-04-30 LAB — CBC
HCT: 38.3 % — ABNORMAL LOW (ref 39.0–52.0)
Hemoglobin: 13 g/dL (ref 13.0–17.0)
MCH: 31.6 pg (ref 26.0–34.0)
MCHC: 33.9 g/dL (ref 30.0–36.0)
MCV: 93.2 fL (ref 80.0–100.0)
Platelets: 137 10*3/uL — ABNORMAL LOW (ref 150–400)
RBC: 4.11 MIL/uL — ABNORMAL LOW (ref 4.22–5.81)
RDW: 13 % (ref 11.5–15.5)
WBC: 7.7 10*3/uL (ref 4.0–10.5)
nRBC: 0 % (ref 0.0–0.2)

## 2023-04-30 LAB — BASIC METABOLIC PANEL
Anion gap: 10 (ref 5–15)
BUN: 17 mg/dL (ref 8–23)
CO2: 25 mmol/L (ref 22–32)
Calcium: 8.5 mg/dL — ABNORMAL LOW (ref 8.9–10.3)
Chloride: 101 mmol/L (ref 98–111)
Creatinine, Ser: 0.72 mg/dL (ref 0.61–1.24)
GFR, Estimated: >60 mL/min (ref >60)
Glucose, Bld: 129 mg/dL — ABNORMAL HIGH (ref 70–99)
Potassium: 3.3 mmol/L — ABNORMAL LOW (ref 3.5–5.1)
Sodium: 136 mmol/L (ref 135–145)

## 2023-04-30 LAB — CBG MONITORING, ED
Glucose-Capillary: 121 mg/dL — ABNORMAL HIGH (ref 70–99)
Glucose-Capillary: 113 mg/dL — ABNORMAL HIGH (ref 70–99)

## 2023-04-30 LAB — PHOSPHORUS: Phosphorus: 2.9 mg/dL (ref 2.5–4.6)

## 2023-04-30 LAB — GLUCOSE, CAPILLARY
Glucose-Capillary: 133 mg/dL — ABNORMAL HIGH (ref 70–99)
Glucose-Capillary: 140 mg/dL — ABNORMAL HIGH (ref 70–99)
Glucose-Capillary: 104 mg/dL — ABNORMAL HIGH (ref 70–99)

## 2023-04-30 LAB — MAGNESIUM: Magnesium: 2.1 mg/dL (ref 1.7–2.4)

## 2023-04-30 MED ORDER — MAGNESIUM SULFATE 2 GM/50ML IV SOLN
2.0000 g | Freq: Once | INTRAVENOUS | Status: AC
  Administered 2023-04-30: 2 g via INTRAVENOUS
  Filled 2023-04-30: qty 50

## 2023-04-30 MED ORDER — ENOXAPARIN SODIUM 40 MG/0.4ML IJ SOSY
40.0000 mg | PREFILLED_SYRINGE | Freq: Every evening | INTRAMUSCULAR | Status: DC
  Administered 2023-04-30 – 2023-05-05 (×6): 40 mg via SUBCUTANEOUS
  Filled 2023-04-30 (×6): qty 0.4

## 2023-04-30 NOTE — Consult Note (Signed)
 NEUROLOGY CONSULT NOTE   Date of service: April 30, 2023 Patient Name: Brandon Clements MRN:  [id number] DOB:  December 14, 1940 Chief Complaint: Loose diarrhea with vomiting Requesting Provider: Von Bellis, MD  History of Present Illness  Brandon Clements is an 83 y.o. 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 His most recent fal was early in the morning on Monday when he was trying to go to the bathroom. His wife feels that this is due to BLE weakness in conjunction with his known neuropathy. Wife did note that he tends to have episodes of vomiting after the falls. He did  not strike his head with this fall or previous falls. He has also had watery diarrhea along with the vomiting. Of note, his grandson has a norovirus infection currently and the patient's wife stated that the patient was exposed. The patient's stool sample did test + for norovirus. Neurology has been consulted for evaluation of possible GBS as reflexes could not be elicited by examiners in the ED. Patient did endorse feeling as though his legs were weak and giving way underneath him when ambulating. He has also had fatigue. No cough, abdominal pain or congestion. Orthostatic testing lying and sitting revealed increased HR by 16 BPM when sitting, but no drop in BP, in fact BP was increased in the  sitting position. While in the ED, patient developed fever of 100.6.   ED course:  Lactate 1.7 Na 138 K 3.4 BUN and Cr normal Blood glucose 220 WBC 12.8 Norovirus PCR in stool was positive  CT abdomen and pelvis was compatible with but nondiagnostic for enteritis versus ileus. New indeterminate, partially solid, noncalcified nodule in the midline omentum, possibly necrotic lymph node was also noted, possibly representing atypical or metastatic head/neck cancer.   CT head: No acute or traumatic finding, generalized atrophy. Metallic density foreign object in the region of the right upper orbit on the left.     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   10/2010, squamous cell cancer  right jaw 2012     Colon polyp    adenomatous   GERD (gastroesophageal reflux disease)    Hiatal hernia    Hx of radiation therapy 2011   left cheek, [place] [facility]   Hx of radiation therapy 12/30/13-01/26/14   scalp 5000 cGy in 20 sessions   Hyperlipidemia    Hypertension    Kidney stones    Myocardial infarction [facility]) 2005   mild   Skin cancer 11/2013   right frontal scalp, squamous cell carcinoma, history basal cell ca   Status post dilation of esophageal narrowing  Past Surgical History:  Procedure Laterality Date   ANGIOPLASTY  2005   3 stents   BIOPSY  12/22/2019   Procedure: BIOPSY;  Surgeon: Abran Norleen SAILOR, MD;  Location: [facility];  Service: Endoscopy;;   Cancer [place]     Skin cancer   cardiac stent     x3   COLONOSCOPY  2010, 2006   hx adenomatous polyps 2006/ Dr. Abran   COLONOSCOPY N/A 12/07/2014   Procedure: COLONOSCOPY;  Surgeon: Norleen SAILOR Abran, MD;  Location: [facility];   Service: Endoscopy;  Laterality: N/A;   COLONOSCOPY WITH PROPOFOL  N/A 12/22/2019   Procedure: COLONOSCOPY WITH PROPOFOL ;  Surgeon: Abran Norleen SAILOR, MD;  Location: [facility];  Service: Endoscopy;  Laterality: N/A;   ESOPHAGOGASTRODUODENOSCOPY N/A 12/07/2014   Procedure: ESOPHAGOGASTRODUODENOSCOPY (EGD);  Surgeon: Norleen SAILOR Abran, MD;  Location: [facility];  Service: Endoscopy;  Laterality: N/A;   ESOPHAGOGASTRODUODENOSCOPY (EGD) WITH PROPOFOL  N/A 12/22/2019   Procedure: ESOPHAGOGASTRODUODENOSCOPY (EGD) WITH PROPOFOL ;  Surgeon: Abran Norleen SAILOR, MD;  Location: [facility];  Service: Endoscopy;  Laterality: N/A;   EXTERNAL EAR SURGERY Left    left ear removal   NECK DISSECTION  Feb 2012   right   POLYPECTOMY  12/22/2019   Procedure: POLYPECTOMY;  Surgeon: Abran Norleen SAILOR, MD;  Location: [facility];  Service: Endoscopy;;   RADICAL NECK DISSECTION  July 2012   left neck, jaw, and ear    Family History: Family History  Problem Relation Age of Onset   Skin cancer Father    Bladder Cancer Father    Alzheimer's disease Mother    Colon cancer Neg Hx    Stomach cancer Neg Hx    Colon polyps Neg Hx    Esophageal cancer Neg Hx    Kidney disease Neg Hx    Gallbladder disease Neg Hx    Diabetes Neg Hx     Social History  reports that he has quit smoking. His smokeless tobacco use includes chew. He reports current alcohol use of about 14.0 standard drinks of alcohol per week. He reports that he does not use drugs.  No Known Allergies  Medications   Current Facility-Administered Medications:     stroke: early stages of recovery book, , Does not apply, Once, Cox, Amy N, DO   acetaminophen  (TYLENOL ) tablet 650 mg, 650 mg, Oral, Q4H PRN **OR** acetaminophen  (TYLENOL ) 160 MG/5ML solution 650 mg, 650 mg, Per Tube, Q4H PRN **OR** acetaminophen  (TYLENOL ) suppository 650 mg, 650 mg, Rectal, Q4H PRN, Cox, Amy N, DO   enoxaparin  (LOVENOX ) injection 40 mg, 40 mg, Subcutaneous, QPM, Von, Dileep, MD    fluticasone  (FLONASE ) 50 MCG/ACT nasal spray 1 spray, 1 spray, Each Nare, Daily PRN, Cox, Amy N, DO   insulin  aspart (novoLOG ) injection 0-15 Units, 0-15 Units, Subcutaneous, TID WC, Cox, Amy N, DO, 2 Units at 04/30/23 1814   insulin  aspart (novoLOG ) injection 0-5 Units, 0-5 Units, Subcutaneous, QHS, Cox, Amy N, DO   metoprolol  tartrate (LOPRESSOR ) tablet 25 mg, 25 mg, Oral, BID, Von Bellis, MD, 25 mg at 04/30/23 1041   pantoprazole  (PROTONIX ) EC tablet 80 mg, 80 mg, Oral, QHS, Cox, Amy N, DO, 80 mg at 04/29/23 2254   senna-docusate (Senokot-S) tablet 1 tablet, 1 tablet, Oral, QHS PRN, Cox, Amy N, DO   simvastatin  (ZOCOR ) tablet 20 mg, 20 mg, Oral, QPM, Cox, Amy N, DO, 20 mg at 04/30/23 1814  Vitals   Vitals:   04/30/23 1130 04/30/23 1429 04/30/23 1446 04/30/23 1500  BP: 111/60 ROLLEN)  99/51 118/60   Pulse: 67 69 66   Resp:  20 16   Temp:  98.2 F (36.8 C) 99.7 F (37.6 C)   TempSrc:  Oral    SpO2: 91% 98% 91%   Weight:    80.5 kg  Height:    5' 9 (1.753 m)    Body mass index is 26.21 kg/m.  Physical Exam   Physical Exam HEENT- [place]/AT. Old left facial skin and subcutaneous tissue graft is prominently seen.    Lungs- Respirations unlabored Extremities- Warm and well-perfused  Neurological Examination Mental Status: Awake and alert. Poorly oriented to time - unable to recall the year, month or day. Able to recall the city and state. Poor insight regarding the reason for admission they think I'm crazy. Somewhat decreased attention. Correctly gives his age. Pleasant and cooperative. Speech fluent with intact naming and comprehension. No dysarthria.  Cranial Nerves: II: Temporal visual fields intact with no extinction to DSS. PERRL. III,IV, VI: No ptosis. EOMI. No nystagmus. V: Touch sensation equal bilaterally VII: Left facial droop in the context of extensive prior left facial surgery VIII: Hearing intact to voice IX,X: No hypophonia or hoarseness XI: Symmetric XII: Midline  tongue extension Motor: RUE: 4+/5 but with decreased mobility at shoulder chronically LUE: 4+/5 but with decreased mobility at shoulder chronically RLE: 4+/5 hip flexion, knee extension, ADF/APF LLE: 4+/5 hip flexion, knee extension, ADF/APF Sensory:  FT intact x 4. No extinction to DSS. Deep Tendon Reflexes: 1+ and symmetric bilateral brachioradialis, trace bilateral triceps. 1+ bilateral patellar reflexes, 0 bilateral achilles.   Cerebellar: No ataxia with FNF bilaterally. Truncal instability is prominent while in the seated position, often starts to tip over to his left side as well as backwards, requiring nursing assistance to stabilize his position.  Gait: Deferred   Labs/Imaging/Neurodiagnostic studies   CBC:  Recent Labs  Lab 05/14/23 0543 04/30/23 1454  WBC 12.8* 7.7  NEUTROABS 11.8*  --   HGB 15.2 13.0  HCT 45.0 38.3*  MCV 95.9 93.2  PLT 159 137*   Basic Metabolic Panel:  Lab Results  Component Value Date   NA 136 04/30/2023   K 3.3 (L) 04/30/2023   CO2 25 04/30/2023   GLUCOSE 129 (H) 04/30/2023   BUN 17 04/30/2023   CREATININE 0.72 04/30/2023   CALCIUM 8.5 (L) 04/30/2023   GFRNONAA >60 04/30/2023   Lipid Panel:  Lab Results  Component Value Date   LDLCALC 52 04/30/2023   HgbA1c: No results found for: HGBA1C Urine Drug Screen:     Component Value Date/Time   LABOPIA NONE DETECTED 2023-05-14 1809   COCAINSCRNUR NONE DETECTED May 14, 2023 1809   LABBENZ NONE DETECTED 2023-05-14 1809   AMPHETMU NONE DETECTED 05/14/2023 1809   THCU NONE DETECTED May 14, 2023 1809   LABBARB NONE DETECTED May 14, 2023 1809    Alcohol Level No results found for: [facility] INR  Lab Results  Component Value Date   INR 1.1 May 14, 2023   APTT  Lab Results  Component Value Date   APTT 31 05/14/23     ASSESSMENT  83 year old male with a complex PMHx as described above who presents after multiple falls at home. He has been febrile with leukocytosis and also with vomiting and  watery diarrhea. [place] has been sick with norovirus. The patient has tested positive for norovirus as well.  - Neurological exam reveals preserved patellar and brachioradialis reflexes as well as mild weakness that is more consistent with deconditioning and fatigue than NMJ or polyradiculopathic  weakness. Overall presentation does not militate in favor of GBS or a myelopathy. He does exhibit truncal instability when sitting but no appendicular ataxia to suggest a cerebellar lesion.  - CT head shows no acute abnormality - Overall impression: Diffuse weakness and fatigability in the context of GI infection with norovirus  RECOMMENDATIONS  - Most likely cannot get an MRI due to metallic density foreign object in the region of the right upper orbit on the left that was seen on CT head. In any event, based on his clinical presentation, MRI is likely to be low yield.  - Management of his GI infection - PT/OT - Neurohospitalist service will sign off. Please call if there are additional questions.  ______________________________________________________________________    Bonney SHARK, Shannen [place], MD [facility]

## 2023-04-30 NOTE — ED Notes (Signed)
 Request made for transport to the floor ?

## 2023-04-30 NOTE — Evaluation (Signed)
 Occupational Therapy Evaluation Patient Details Name: Brandon Clements MRN: [id number] DOB: 07-03-1940 Today's Date: 04/30/2023   History of Present Illness 83 year old male with history of hypertension, hyperlipidemia, GERD, non-insulin -dependent diabetes mellitus, who presents emergency department for chief concerns of weakness in the bilateral legs and multiple falls. +Norovirus.   Clinical Impression   Pt was seen for OT evaluation and co-tx with PT this date to optimize safety with ADL mobility. Prior to hospital admission, pt reports being independent. Pt denies recent falls history, however, per chart review pt has had several recent falls. No family present to verify PLOF/home setup. Pt presents to acute OT demonstrating impaired ADL performance and functional mobility 2/2 decreased strength, balance, activity tolerance, and cognition (safety awareness, awareness of deficits, problem solving) (See OT problem list for additional functional deficits). Pt currently requires MOD A for bed mobility, MOD-MAX A for sitting balance with heavy posterior and L lateral lean improving slightly with assist, VC/TC, and dynamic reaching. Pt required handheld assist and MIN-MOD A +2 to stand EOB and take a couple left lateral steps. Pt would benefit from skilled OT services to address noted impairments and functional limitations (see below for any additional details) in order to maximize safety and independence while minimizing falls risk and caregiver burden.      If plan is discharge home, recommend the following: Two people to help with walking and/or transfers;A lot of help with bathing/dressing/bathroom;Assistance with cooking/housework;Assist for transportation;Help with stairs or ramp for entrance;Direct supervision/assist for medications management;Supervision due to cognitive status    Functional Status Assessment  Patient has had a recent decline in their functional status and demonstrates the ability  to make significant improvements in function in a reasonable and predictable amount of time.  Equipment Recommendations  Other (comment) (defer)    Recommendations for Other Services       Precautions / Restrictions Precautions Precautions: Fall Restrictions Weight Bearing Restrictions Per Provider Order: No      Mobility Bed Mobility Overal bed mobility: Needs Assistance Bed Mobility: Supine to Sit, Sit to Supine     Supine to sit: Mod assist, HOB elevated Sit to supine: Mod assist        Transfers Overall transfer level: Needs assistance Equipment used: 2 person hand held assist Transfers: Sit to/from Stand Sit to Stand: Min assist, Mod assist, +2 physical assistance                  Balance Overall balance assessment: Needs assistance Sitting-balance support: Bilateral upper extremity supported, Feet supported Sitting balance-Leahy Scale: Poor Sitting balance - Comments: required MOD-MAX A for static sitting balance, poor self-righting skills Postural control: Posterior lean, Left lateral lean Standing balance support: Bilateral upper extremity supported Standing balance-Leahy Scale: Poor                             ADL either performed or assessed with clinical judgement   ADL Overall ADL's : Needs assistance/impaired                                       General ADL Comments: Pt currently requires MAX A for LB ADL tasks, assist for maintaining static sitting balance for seated table top ADL, and MIN-MOD A +2 for ADL transfers     Vision         Perception  Praxis         Pertinent Vitals/Pain Pain Assessment Pain Assessment: No/denies pain     Extremity/Trunk Assessment Upper Extremity Assessment Upper Extremity Assessment: Generalized weakness   Lower Extremity Assessment Lower Extremity Assessment: Generalized weakness       Communication     Cognition Arousal: Alert Behavior During  Therapy: WFL for tasks assessed/performed Overall Cognitive Status: No family/caregiver present to determine baseline cognitive functioning                                 General Comments: Pt initially sleeping requiring increased time, verbal cues, and repositioning to improve alertness for session. Cues for safety/poor self-righting skills     General Comments       Exercises     Shoulder Instructions      Home Living Family/patient expects to be discharged to:: Private residence Living Arrangements: Spouse/significant other Available Help at Discharge: Family;Available 24 hours/day Type of Home: House Home Access: Stairs to enter [employer] of Steps: 2?   Home Layout: One level               Home Equipment: Agricultural Consultant (2 wheels);Cane - single point          Prior Functioning/Environment Prior Level of Function : Independent/Modified Independent;Patient poor historian/Family not available;History of Falls (last six months)             Mobility Comments: Pt denies AD use, denies falls however per chart family endorsing multiple recent falls          OT Problem List: Decreased strength;Decreased cognition;Decreased activity tolerance;Decreased safety awareness;Impaired balance (sitting and/or standing);Decreased knowledge of use of DME or AE      OT Treatment/Interventions: Self-care/ADL training;Therapeutic exercise;Therapeutic activities;Cognitive remediation/compensation;Energy conservation;DME and/or AE instruction;Patient/family education;Balance training    OT Goals(Current goals can be found in the care plan section) Acute Rehab OT Goals Patient Stated Goal: pt does not state OT Goal Formulation: With patient Time For Goal Achievement: 05/14/23 Potential to Achieve Goals: Good ADL Goals Pt Will Perform Lower Body Dressing: sit to/from stand;with min assist Pt Will Transfer to Toilet: bedside commode;ambulating;with min  assist (LRAD) Pt Will Perform Toileting - Clothing Manipulation and hygiene: with modified independence;sitting/lateral leans Additional ADL Goal #1: Pt will maintain fair static sitting balance without cues or assist to maintain for seated ADL tasks, 3/3 opportunities.  OT Frequency: Min 1X/week    Co-evaluation PT/OT/SLP Co-Evaluation/Treatment: Yes Reason for Co-Treatment: To address functional/ADL transfers;For patient/therapist safety PT goals addressed during session: Mobility/safety with mobility;Balance OT goals addressed during session: ADL's and self-care      AM-PAC OT 6 Clicks Daily Activity     Outcome Measure Help from another person eating meals?: None Help from another person taking care of personal grooming?: A Little Help from another person toileting, which includes using toliet, bedpan, or urinal?: A Lot Help from another person bathing (including washing, rinsing, drying)?: A Lot Help from another person to put on and taking off regular upper body clothing?: A Lot Help from another person to put on and taking off regular lower body clothing?: A Lot 6 Click Score: 15   End of Session Nurse Communication: Mobility status  Activity Tolerance: Patient tolerated treatment well Patient left: in bed;with call bell/phone within reach  OT Visit Diagnosis: Other abnormalities of gait and mobility (R26.89);Repeated falls (R29.6);Muscle weakness (generalized) (M62.81)  Time: 8974-8961 OT Time Calculation (min): 13 min Charges:  OT General Charges $OT Visit: 1 Visit OT Evaluation $OT Eval Moderate Complexity: 1 Mod  Warren SAUNDERS., MPH, MS, OTR/L ascom [phone] 04/30/23, 11:21 AM

## 2023-04-30 NOTE — ED Notes (Signed)
Informed RN bed assigned 

## 2023-04-30 NOTE — Evaluation (Signed)
 Physical Therapy Evaluation Patient Details Name: DONYEA BEVERLIN MRN: [id number] DOB: [birth year] Today's Date: 04/30/2023  History of Present Illness  83 year old male with history of hypertension, hyperlipidemia, GERD, non-insulin -dependent diabetes mellitus, who presents emergency department for chief concerns of weakness in the bilateral legs and multiple falls. +Norovirus.  Clinical Impression  Patient sleeping on stretcher in ED upon arrival to bed-space; mod cuing (light touch) to awaken and maintain initial alertness.  Patient generally oriented to self, location and situation; follows simple commands, but demonstrates limited insight into deficits and overall need for assist.  Generally weak and deconditioned throughout all extremities; no focal weakness appreciated. Able to complete bed mobility with mod/max assist; unsupported sitting balance, mod/max assist (poor midline awareness, poor righting/balance reactions); sit/stand, standing balance and lateral stepping edge of bed (5') with bilat HHA, min/mod assist +2.  Demonstrates very short, shuffling steps; manual facilitation to unweight/advance LEs, difficulty with L LE placement at times. Poor dynamic balance reactions; hands-on assist at all times to prevent LOB.  Anticipate need for RW, physical assist and chair follow for progressive mobility efforts in subsequent sessions. Would benefit from skilled PT to address above deficits and promote optimal return to PLOF.; recommend post-acute PT follow up as indicated by interdisciplinary care team.     Of note, patient maintaining O2 sats on RA >92% at rest and with exertion during session.  [place] removed and patient left on RA end of session; RN informed/aware.      If plan is discharge home, recommend the following: Two people to help with walking and/or transfers;Two people to help with bathing/dressing/bathroom   Can travel by private vehicle        Equipment Recommendations     Recommendations for Other Services       Functional Status Assessment Patient has had a recent decline in their functional status and demonstrates the ability to make significant improvements in function in a reasonable and predictable amount of time.     Precautions / Restrictions Precautions Precautions: Fall Restrictions Weight Bearing Restrictions Per Provider Order: No      Mobility  Bed Mobility Overal bed mobility: Needs Assistance Bed Mobility: Supine to Sit     Supine to sit: Mod assist Sit to supine: Mod assist        Transfers Overall transfer level: Needs assistance Equipment used: 2 person hand held assist Transfers: Sit to/from Stand Sit to Stand: Min assist, +2 physical assistance                Ambulation/Gait Ambulation/Gait assistance: Mod assist, Min assist Gait Distance (Feet):  (5' lateral stepping)           General Gait Details: very short, shuffling steps; manual facilitation to unweight/advance LEs, difficulty with L LE placement at times.  Poor dynamic balance reactions; hands-on assist at all times to prevent LOB  Stairs            Wheelchair Mobility     Tilt Bed    Modified Rankin (Stroke Patients Only)       Balance Overall balance assessment: Needs assistance Sitting-balance support: No upper extremity supported, Feet supported Sitting balance-Leahy Scale: Poor Sitting balance - Comments: required MOD-MAX A for static sitting balance, poor self-righting skills   Standing balance support: Bilateral upper extremity supported Standing balance-Leahy Scale: Poor  Pertinent Vitals/Pain Pain Assessment Pain Assessment: No/denies pain    Home Living Family/patient expects to be discharged to:: Private residence Living Arrangements: Spouse/significant other;Children;Other relatives Available Help at Discharge: Family;Available 24 hours/day Type of Home: House Home  Access: Stairs to enter   [employer] of Steps: 2?   Home Layout: One level Home Equipment: [employer] (2 wheels);Cane - single point Additional Comments: Patient questionable historian; will verify information with family as available.    Prior Function Prior Level of Function : Independent/Modified Independent;Patient poor historian/Family not available;History of Falls (last six months)             Mobility Comments: Pt denies AD use, denies falls however per chart family endorsing multiple recent falls       Extremity/Trunk Assessment   Upper Extremity Assessment Upper Extremity Assessment: Generalized weakness (grossly 4-/5 throughout)    Lower Extremity Assessment Lower Extremity Assessment: Generalized weakness (grossly 4-/5 throughout; no focal asymmetry)       Communication      Cognition Arousal: Alert Behavior During Therapy: WFL for tasks assessed/performed Overall Cognitive Status: No family/caregiver present to determine baseline cognitive functioning                                 General Comments: Pt initially sleeping requiring increased time, verbal cues, and repositioning to improve alertness for session. Cues for safety/poor self-righting skills.  Limited insight into deficits and overall need for assist        General Comments      Exercises     Assessment/Plan    PT Assessment Patient needs continued PT services  PT Problem List Decreased strength;Decreased activity tolerance;Decreased balance;Decreased mobility;Decreased coordination;Decreased knowledge of use of DME;Decreased cognition;Decreased safety awareness;Decreased knowledge of precautions       PT Treatment Interventions DME instruction;Gait training;Stair training;Functional mobility training;Therapeutic activities;Therapeutic exercise;Balance training;Neuromuscular re-education;Cognitive remediation;Patient/family education    PT Goals (Current  goals can be found in the Care Plan section)  Acute Rehab PT Goals Patient Stated Goal: to get better, to stand up PT Goal Formulation: With patient Time For Goal Achievement: 05/14/23 Potential to Achieve Goals: Fair    Frequency Min 1X/week     Co-evaluation PT/OT/SLP Co-Evaluation/Treatment: Yes Reason for Co-Treatment: To address functional/ADL transfers;For patient/therapist safety PT goals addressed during session: Mobility/safety with mobility;Balance OT goals addressed during session: ADL's and self-care       AM-PAC PT 6 Clicks Mobility  Outcome Measure Help needed turning from your back to your side while in a flat bed without using bedrails?: A Little Help needed moving from lying on your back to sitting on the side of a flat bed without using bedrails?: A Lot Help needed moving to and from a bed to a chair (including a wheelchair)?: A Lot Help needed standing up from a chair using your arms (e.g., wheelchair or bedside chair)?: A Lot Help needed to walk in hospital room?: A Lot Help needed climbing 3-5 steps with a railing? : A Lot 6 Click Score: 13    End of Session   Activity Tolerance: Patient tolerated treatment well Patient left: in bed;with call bell/phone within reach Nurse Communication: Mobility status PT Visit Diagnosis: Muscle weakness (generalized) (M62.81);Difficulty in walking, not elsewhere classified (R26.2)    Time: 1023-1040 PT Time Calculation (min) (ACUTE ONLY): 17 min   Charges:   PT Evaluation $PT Eval Moderate Complexity: 1 Mod   PT General Charges $$  ACUTE PT VISIT: 1 Visit        Azariah Latendresse H. Delores, PT, DPT, NCS 04/30/23, 4:27 PM [phone]

## 2023-04-30 NOTE — ED Notes (Signed)
 Pt at this time found to be covered in stool. Pt incontinence care provided and replacement linens given.

## 2023-04-30 NOTE — Progress Notes (Signed)
 [facility] Progress Note  Patient: Brandon Clements    FMW:[id number]  DOA: 04/29/2023     Date of Service: the patient was seen and examined on 04/30/2023  Chief Complaint  Patient presents with   Fall   Brief hospital course: Brandon Clements is a 83 year old male with history of hypertension, hyperlipidemia, GERD, non-insulin -dependent diabetes mellitus, who presents emergency department for chief concerns of weakness in the bilateral legs and multiple falls. Patient was exposed to norovirus at home by his grandson.  Patient is having diarrhea for past 3 days.  Vitals in the ED showed temperature of 98.6, respiration rate of 18, heart rate 97, blood pressure 162/82, SpO2 of 98% on room air.   While in the ED, patient developed fever of 100.6.   Serum sodium is 138, potassium 3.4, chloride 100, bicarb 24, BUN of 11, serum creatinine 0.88, EGFR greater than 60, nonfasting blood glucose 220, WBC 12.8, hemoglobin 15.2, platelets of 152.   COVID/influenza A/influenza B/RSV PCR were negative.   Lactic acid was 1.7 and on repeat is 1.5.  Blood cultures x 2 have been collected and are in process.   CT abdomen pelvis w cm: Was read as multiple fluid-filled loops of small and large bowel with a few mildly dilated loops of small bowel in the central abdomen.  Findings are nonspecific but could reflect enteritis or ileus.  No evidence of high-grade bowel obstruction or perforation. New indeterminate, partially solid, noncalcified nodule in the midline omentum, possibly necrotic lymph node.  This could be atypical or metastatic head/neck cancer.  Consider further CT PET/CT or abdominal CT in 3 to 6 months.   Cholelithiasis without evidence of cholecystitis or biliary ductal dilatation.   CT head wo contrast: Read as no acute or traumatic finding, generalized atrophy.  Metallic density foreign object in the region of the right upper orbit on the left.   Assessment and Plan:  # Bilateral  lower extremity weakness Could be due to electrolyte imbalance versus Guillain-Barr syndrome Ct Head: 1. No acute or traumatic finding. Generalized atrophy. 2. 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 Metallic density foreign object in the region of the upper orbit on the left. MRI cannot be ordered due to metallic foreign body  Continue neurochecks Continue to monitor on telemetry PT and OT eval recommend SNF placement Continue fall and aspiration precautions Follow neurology for further recommendation  # Hypomagnesemia, mag repleted. Monitor electrolytes.   # Essential hypertension Home metoprolol  tartrate 25 mg p.o. twice daily resumed Monitor BP and titrate medications accordingly  # Diabetes mellitus type 2, noninsulin dependent (HCC) Home glimepiride will not resumed on admission Insulin  SSI with at bedtime coverage ordered   # Omental mass Per CT read: New indeterminate, partially solid noncalcified nodule in the midline omentum, possibly a necrotic lymph node. Recommend outpatient follow-up PET/CT or abdominal CT in 3 to 6 months outpatient   # GERD Home PPI equivalent dosing resumed   # Coronary atherosclerosis Patient reports he is no longer taking aspirin Home simvastatin  20 mg every evening resumed   There is no height or weight on file to calculate BMI.  Interventions:  Diet: Heart healthy/carb modified diet DVT Prophylaxis: Subcutaneous Lovenox    Advance goals of care discussion: Full code  Family Communication: family was not present at bedside, at the time of interview.  The pt provided permission to discuss medical plan with the family. Opportunity was given to ask question and  all questions were answered satisfactorily.   Disposition:  Pt is from Home, admitted with Fall and weakness of lower ext, norovirus positive, still has risk of fall and electrolyte imbalance, which precludes a safe discharge. Discharge to  SNF, when stable, most likely in 1 to 2 days.  Subjective: No significant events overnight, patient had diarrhea for past 3 days, no diarrhea today, had 2 episodes yesterday.  Denied any abdominal pain, no nausea vomiting. Still feels lower extremity weakness. Physical Exam: General: NAD, lying comfortably Appear in no distress, affect appropriate Eyes: PERRLA ENT: Oral Mucosa Clear, moist  Neck: no JVD,  Cardiovascular: S1 and S2 Present, no Murmur,  Respiratory: good respiratory effort, Bilateral Air entry equal and Decreased, no Crackles, no wheezes Abdomen: Bowel Sound present, Soft and no tenderness,  Skin: no rashes Extremities: no Pedal edema, no calf tenderness Neurologic: without any new focal findings, mild weakness in the left lower extremities, power 4-5/5 Gait not checked due to patient safety concerns  Vitals:   04/30/23 0100 04/30/23 0130 04/30/23 0510 04/30/23 0600  BP: 127/66 130/69 137/69 138/68  Pulse: 67 76 75 72  Resp:  20 18 18   Temp:   98.6 F (37 C)   TempSrc:   Oral   SpO2: 95% 96% 95% 98%    Intake/Output Summary (Last 24 hours) at 04/30/2023 0815 Last data filed at 04/29/2023 2225 Gross per 24 hour  Intake 1240 ml  Output 100 ml  Net 1140 ml   There were no vitals filed for this visit.  Data Reviewed: I have personally reviewed and interpreted daily labs, tele strips, imagings as discussed above. I reviewed all nursing notes, pharmacy notes, vitals, pertinent old records I have discussed plan of care as described above with RN and patient/family.  CBC: Recent Labs  Lab 04/29/23 0543  WBC 12.8*  NEUTROABS 11.8*  HGB 15.2  HCT 45.0  MCV 95.9  PLT 159   Basic Metabolic Panel: Recent Labs  Lab 04/29/23 0543 04/29/23 1706  NA 138  --   K 3.4*  --   CL 100  --   CO2 25  --   GLUCOSE 220*  --   BUN 11  --   CREATININE 0.88  --   CALCIUM 9.2  --   MG  --  1.6*    Studies: CT HEAD WO CONTRAST ( ) Result Date: 04/29/2023 CLINICAL  DATA:  Syncope/presyncope.  Multiple falls. EXAM: CT HEAD WITHOUT CONTRAST TECHNIQUE: Contiguous axial images were obtained from the base of the skull through the vertex without intravenous contrast. RADIATION DOSE REDUCTION: This exam was performed according to the departmental dose-optimization program which includes automated exposure control, adjustment of the mA and/or kV according to patient size and/or use of iterative reconstruction technique. COMPARISON:  07/13/2011 FINDINGS: Brain: Generalized atrophy. No evidence of focal infarction, mass lesion, hemorrhage, hydrocephalus or extra-axial collection. Vascular: No abnormal vascular finding. Skull: Extensive resection of the temporal bone, skull base and mandible on the left. Sinuses/Orbits: Clear sinuses. Metallic density foreign object in the region of the upper orbit on the left. Other: None IMPRESSION: 1. No acute or traumatic finding. Generalized atrophy. 2. 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 Metallic density foreign object in the region of the upper orbit on the left. Electronically Signed   By: Oneil Officer M.D.   On: 04/29/2023 13:55   CT ABDOMEN PELVIS W CONTRAST Result Date: 04/29/2023 CLINICAL DATA:  Sepsis, multiple falls, weakness.  History of head and neck cancer. EXAM: CT ABDOMEN AND PELVIS WITH CONTRAST TECHNIQUE: Multidetector CT imaging of the abdomen and pelvis was performed using the standard protocol following bolus administration of intravenous contrast. RADIATION DOSE REDUCTION: This exam was performed according to the departmental dose-optimization program which includes automated exposure control, adjustment of the mA and/or kV according to patient size and/or use of iterative reconstruction technique. CONTRAST:  OMNIPAQUE  IOHEXOL  300 MG/ML  SOLN COMPARISON:  PET-CT 09/22/2010.  Lumbar MRI 02/28/2023. FINDINGS: Lower chest: Clear lung bases. No significant pleural or pericardial  effusion. Aortic and coronary artery atherosclerosis with a moderate size hiatal hernia. Hepatobiliary: The liver is normal in density without suspicious focal abnormality. Single calcified gallstone. No evidence of gallbladder wall thickening, surrounding inflammation or biliary ductal dilatation. Pancreas: Unremarkable. No pancreatic ductal dilatation or surrounding inflammatory changes. Spleen: Normal in size without focal abnormality. Adrenals/Urinary Tract: Both adrenal glands appear normal. No evidence of urinary tract calculus, suspicious renal lesion or hydronephrosis. The bladder appears unremarkable for its degree of distention. Stomach/Bowel: No enteric contrast administered. As above, moderate-size hiatal hernia. The stomach otherwise appears unremarkable for its degree of distention. There are multiple fluid-filled loops of small and large bowel. There are few mildly dilated loops of small bowel in the central abdomen, without focal transition point, wall thickening or surrounding inflammation. The appendix appears normal. Vascular/Lymphatic: There is a new partially solid, noncalcified nodule in the midline omentum measuring 2.1 x 1.6 cm on image 49/2, possibly a necrotic lymph node. No other enlarged abdominopelvic lymph nodes are identified. Aortic and branch vessel atherosclerosis without evidence of aneurysm or large vessel occlusion. Reproductive: Mild enlargement of the prostate gland with mild central heterogeneity. Other: Small umbilical hernia containing only fat. Asymmetric fat in the right inguinal canal. No ascites or generalized peritoneal nodularity. Musculoskeletal: No acute or significant osseous findings. Mild spondylosis. IMPRESSION: 1. Multiple fluid-filled loops of small and large bowel with a few mildly dilated loops of small bowel in the central abdomen. Findings are nonspecific but could reflect enteritis or ileus. No evidence of high-grade bowel obstruction or perforation. 2.  New indeterminate, partially solid, noncalcified nodule in the midline omentum, possibly a necrotic lymph node. This would be atypical for metastatic head and neck cancer. Consider further evaluation with PET-CT or abdominal CT follow-up in 3-6 months. 3. Moderate-size hiatal hernia. 4. Cholelithiasis without evidence of cholecystitis or biliary ductal dilatation. 5.  Aortic Atherosclerosis (ICD10-I70.0). Electronically Signed   By: Elsie Perone M.D.   On: 04/29/2023 11:04    Scheduled Meds:   stroke: early stages of recovery book   Does not apply Once   insulin  aspart  0-15 Units Subcutaneous TID WC   insulin  aspart  0-5 Units Subcutaneous QHS   metoprolol  tartrate  25 mg Oral BID   pantoprazole   80 mg Oral QHS   simvastatin   20 mg Oral QPM   Continuous Infusions:  magnesium  sulfate bolus IVPB     PRN Meds: acetaminophen  **OR** acetaminophen  (TYLENOL ) oral liquid 160 mg/5 mL **OR** acetaminophen , fluticasone , senna-docusate  Time spent: 35 minutes  Author: ELVAN SOR. MD [facility] 04/30/2023 8:15 AM  To reach On-call, see care teams to locate the attending and reach out to them via [email or web address]. If 7PM-7AM, please contact night-coverage If you still have difficulty reaching the attending provider, please page the [facility] (Director on Call) for [facility] on amion for assistance.

## 2023-04-30 NOTE — ED Notes (Addendum)
 Pt attempting try to sit up out of bed. Redirected. Pt oriented to self, place, and time. Pt given water per request.

## 2023-05-01 DIAGNOSIS — R531 Weakness: Secondary | ICD-10-CM | POA: Diagnosis not present

## 2023-05-01 LAB — GLUCOSE, CAPILLARY
Glucose-Capillary: 85 mg/dL (ref 70–99)
Glucose-Capillary: 109 mg/dL — ABNORMAL HIGH (ref 70–99)
Glucose-Capillary: 116 mg/dL — ABNORMAL HIGH (ref 70–99)
Glucose-Capillary: 123 mg/dL — ABNORMAL HIGH (ref 70–99)
Glucose-Capillary: 103 mg/dL — ABNORMAL HIGH (ref 70–99)
Glucose-Capillary: 118 mg/dL — ABNORMAL HIGH (ref 70–99)

## 2023-05-01 LAB — BASIC METABOLIC PANEL
Anion gap: 10 (ref 5–15)
BUN: 14 mg/dL (ref 8–23)
CO2: 27 mmol/L (ref 22–32)
Calcium: 8.7 mg/dL — ABNORMAL LOW (ref 8.9–10.3)
Chloride: 101 mmol/L (ref 98–111)
Creatinine, Ser: 0.63 mg/dL (ref 0.61–1.24)
GFR, Estimated: >60 mL/min (ref >60)
Glucose, Bld: 97 mg/dL (ref 70–99)
Potassium: 3.4 mmol/L — ABNORMAL LOW (ref 3.5–5.1)
Sodium: 138 mmol/L (ref 135–145)

## 2023-05-01 LAB — CBC
HCT: 38.2 % — ABNORMAL LOW (ref 39.0–52.0)
Hemoglobin: 13.1 g/dL (ref 13.0–17.0)
MCH: 32.3 pg (ref 26.0–34.0)
MCHC: 34.3 g/dL (ref 30.0–36.0)
MCV: 94.3 fL (ref 80.0–100.0)
Platelets: 128 10*3/uL — ABNORMAL LOW (ref 150–400)
RBC: 4.05 MIL/uL — ABNORMAL LOW (ref 4.22–5.81)
RDW: 12.8 % (ref 11.5–15.5)
WBC: 5.4 10*3/uL (ref 4.0–10.5)
nRBC: 0 % (ref 0.0–0.2)

## 2023-05-01 LAB — PHOSPHORUS: Phosphorus: 2.4 mg/dL — ABNORMAL LOW (ref 2.5–4.6)

## 2023-05-01 LAB — HEMOGLOBIN A1C
Hgb A1c MFr Bld: 5.9 % — ABNORMAL HIGH (ref 4.8–5.6)
Mean Plasma Glucose: 123 mg/dL

## 2023-05-01 LAB — MAGNESIUM: Magnesium: 2.1 mg/dL (ref 1.7–2.4)

## 2023-05-01 MED ORDER — K PHOS MONO-SOD PHOS DI & MONO 155-852-130 MG PO TABS
500.0000 mg | ORAL_TABLET | Freq: Once | ORAL | Status: AC
  Administered 2023-05-01: 500 mg via ORAL
  Filled 2023-05-01: qty 2

## 2023-05-01 MED ORDER — POTASSIUM CHLORIDE CRYS ER 20 MEQ PO TBCR
40.0000 meq | EXTENDED_RELEASE_TABLET | Freq: Once | ORAL | Status: AC
  Administered 2023-05-01: 40 meq via ORAL
  Filled 2023-05-01: qty 2

## 2023-05-01 NOTE — Plan of Care (Signed)
  Problem: Education: Goal: Ability to describe self-care measures that may prevent or decrease complications (Diabetes Survival Skills Education) will improve Outcome: Progressing   Problem: Coping: Goal: Ability to adjust to condition or change in health will improve Outcome: Progressing   Problem: Health Behavior/Discharge Planning: Goal: Ability to identify and utilize available resources and services will improve Outcome: Progressing Goal: Ability to manage health-related needs will improve Outcome: Progressing   Problem: Tissue Perfusion: Goal: Adequacy of tissue perfusion will improve Outcome: Progressing

## 2023-05-01 NOTE — Progress Notes (Signed)
 [facility] Progress Note  Patient: Brandon Clements    FMW:[id number]  DOA: 04/29/2023     Date of Service: the patient was seen and examined on 05/01/2023  Chief Complaint  Patient presents with   Fall   Brief hospital course: [name] is a 83 year old male with history of hypertension, hyperlipidemia, GERD, non-insulin -dependent diabetes mellitus, who presents emergency department for chief concerns of weakness in the bilateral legs and multiple falls. Patient was exposed to norovirus at home by his grandson.  Patient is having diarrhea for past 3 days.  Vitals in the ED showed temperature of 98.6, respiration rate of 18, heart rate 97, blood pressure 162/82, SpO2 of 98% on room air.   While in the ED, patient developed fever of 100.6.   Serum sodium is 138, potassium 3.4, chloride 100, bicarb 24, BUN of 11, serum creatinine 0.88, EGFR greater than 60, nonfasting blood glucose 220, WBC 12.8, hemoglobin 15.2, platelets of 152.   COVID/influenza A/influenza B/RSV PCR were negative.   Lactic acid was 1.7 and on repeat is 1.5.  Blood cultures x 2 have been collected and are in process.   CT abdomen pelvis w cm: Was read as multiple fluid-filled loops of small and large bowel with a few mildly dilated loops of small bowel in the central abdomen.  Findings are nonspecific but could reflect enteritis or ileus.  No evidence of high-grade bowel obstruction or perforation. New indeterminate, partially solid, noncalcified nodule in the midline omentum, possibly necrotic lymph node.  This could be atypical or metastatic head/neck cancer.  Consider further CT PET/CT or abdominal CT in 3 to 6 months.   Cholelithiasis without evidence of cholecystitis or biliary ductal dilatation.   CT head wo contrast: Read as no acute or traumatic finding, generalized atrophy.  Metallic density foreign object in the region of the right upper orbit on the left.   Assessment and Plan:  # Norovirus  gastroenteritis Diarrhea improving, no diarrhea today on 1/8 Continue oral hydration Monitor electrolytes   # Bilateral lower extremity weakness, most likely due to dehydration and electrolyte imbalance secondary to norovirus infection. Seen by neurology, less likely Guillain-Barr syndrome, no further workup needed Ct Head: 1. No acute or traumatic finding. Generalized atrophy. 2. 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 Metallic density foreign object in the region of the upper orbit on the left. MRI cannot be ordered due to metallic foreign body  PT/OT eval done, recommend SNF placement Continue to monitor on telemetry Continue fall precautions TOC following for disposition plan  # Hypokalemia, potassium repleted. # Hypophosphatemia, Phos repleted. # Hypomagnesemia, mag repleted. Monitor electrolytes.   # Essential hypertension Home metoprolol  tartrate 25 mg p.o. twice daily resumed Monitor BP and titrate medications accordingly  # Diabetes mellitus type 2, noninsulin dependent (HCC) Home glimepiride will not resumed on admission Insulin  SSI with at bedtime coverage ordered   # Omental mass Per CT read: New indeterminate, partially solid noncalcified nodule in the midline omentum, possibly a necrotic lymph node. Recommend outpatient follow-up PET/CT or abdominal CT in 3 to 6 months outpatient   # GERD Home PPI equivalent dosing resumed   # Coronary atherosclerosis Patient reports he is no longer taking aspirin Home simvastatin  20 mg every evening resumed   Body mass index is 26.21 kg/m.  Interventions:  Diet: Heart healthy/carb modified diet DVT Prophylaxis: Subcutaneous Lovenox    Advance goals of care discussion: Full code  Family Communication: family  was not present at bedside, at the time of interview.  The pt provided permission to discuss medical plan with the family. Opportunity was given to ask question and all  questions were answered satisfactorily.   Disposition:  Pt is from Home, admitted with Fall and weakness of lower ext, norovirus positive, clinically stable, medically optimized to discharge.   Discharge to SNF, when bed will be available. Follow TOC for disposition plan, stable to DC.  Subjective: No significant events overnight, no diarrhea today, patient had 2-3 episodes of diarrhea yesterday.  Feels stronger with the lower extremities but his balance is not good while working with PT. PT recommended SNF placement, patient is still thinking to go to SNF versus home with home PT.  Physical Exam: General: NAD, lying comfortably Appear in no distress, affect appropriate Eyes: PERRLA ENT: Oral Mucosa Clear, moist  Neck: no JVD,  Cardiovascular: S1 and S2 Present, no Murmur,  Respiratory: good respiratory effort, Bilateral Air entry equal and Decreased, no Crackles, no wheezes Abdomen: Bowel Sound present, Soft and no tenderness,  Skin: no rashes Extremities: no Pedal edema, no calf tenderness Neurologic: without any new focal findings, mild weakness in the left lower extremities, power 4-5/5 Gait not checked due to patient safety concerns  Vitals:   04/30/23 1959 05/01/23 0444 05/01/23 0747 05/01/23 1142  BP: (!) 140/73 122/69 132/70 111/69  Pulse: 60 64 64 (!) 59  Resp: 18 17 14 16   Temp: 98.5 F (36.9 C) 98.5 F (36.9 C) 97.9 F (36.6 C) 97.6 F (36.4 C)  TempSrc:      SpO2: 94% 96% 90% 94%  Weight:      Height:        Intake/Output Summary (Last 24 hours) at 05/01/2023 1249 Last data filed at 05/01/2023 0600 Gross per 24 hour  Intake --  Output 300 ml  Net -300 ml   Filed Weights   04/30/23 1500  Weight: 80.5 kg    Data Reviewed: I have personally reviewed and interpreted daily labs, tele strips, imagings as discussed above. I reviewed all nursing notes, pharmacy notes, vitals, pertinent old records I have discussed plan of care as described above with RN and  patient/family.  CBC: Recent Labs  Lab 04/29/23 0543 04/30/23 1454 05/01/23 0457  WBC 12.8* 7.7 5.4  NEUTROABS 11.8*  --   --   HGB 15.2 13.0 13.1  HCT 45.0 38.3* 38.2*  MCV 95.9 93.2 94.3  PLT 159 137* 128*   Basic Metabolic Panel: Recent Labs  Lab 04/29/23 0543 04/29/23 1706 04/30/23 1454 05/01/23 0457  NA 138  --  136 138  K 3.4*  --  3.3* 3.4*  CL 100  --  101 101  CO2 25  --  25 27  GLUCOSE 220*  --  129* 97  BUN 11  --  17 14  CREATININE 0.88  --  0.72 0.63  CALCIUM 9.2  --  8.5* 8.7*  MG  --  1.6* 2.1 2.1  PHOS  --   --  2.9 2.4*    Studies: No results found.   Scheduled Meds:   stroke: early stages of recovery book   Does not apply Once   enoxaparin  (LOVENOX ) injection  40 mg Subcutaneous QPM   insulin  aspart  0-15 Units Subcutaneous TID WC   insulin  aspart  0-5 Units Subcutaneous QHS   metoprolol  tartrate  25 mg Oral BID   pantoprazole   80 mg Oral QHS   simvastatin   20 mg  Oral QPM   Continuous Infusions:   PRN Meds: acetaminophen  **OR** acetaminophen  (TYLENOL ) oral liquid 160 mg/5 mL **OR** acetaminophen , fluticasone , senna-docusate  Time spent: 35 minutes  Author: ELVAN SOR. MD [facility] 05/01/2023 12:49 PM  To reach On-call, see care teams to locate the attending and reach out to them via [email or web address]. If 7PM-7AM, please contact night-coverage If you still have difficulty reaching the attending provider, please page the [facility] (Director on Call) for [facility] on amion for assistance.

## 2023-05-01 NOTE — Progress Notes (Signed)
 Physical Therapy Treatment Patient Details Name: Brandon Clements MRN: [id number] DOB: Apr 06, 1941 Today's Date: 05/01/2023   History of Present Illness 83 year old male with history of hypertension, hyperlipidemia, GERD, non-insulin -dependent diabetes mellitus, who presents emergency department for chief concerns of weakness in the bilateral legs and multiple falls. +Norovirus.    PT Comments  Pt was long sitting in bed upon arrival. Supportive spouse at bedside and present throughout. She confirmed baseline cognition and abilities. Endorses pt having frequent falls in past 6 months. Pt is cooperative and agreeable to OOB activity. Does have some cognition deficits but overall able to follow commands consistently. Pt remains at high risk of falls. Several occasions of posterior LOB in sitting and standing. Used RW. Due to pt's safety concerns with mobility, DC recs remain appropriate.  Discussed DC recs with pt/spouse.    If plan is discharge home, recommend the following: A lot of help with walking and/or transfers;A lot of help with bathing/dressing/bathroom;Assistance with cooking/housework;Direct supervision/assist for medications management;Direct supervision/assist for financial management;Assist for transportation;Help with stairs or ramp for entrance;Supervision due to cognitive status     Equipment Recommendations  Other (comment) (Defer to next level of care)       Precautions / Restrictions Precautions Precautions: Fall Restrictions Weight Bearing Restrictions Per Provider Order: No     Mobility  Bed Mobility Overal bed mobility: Needs Assistance Bed Mobility: Supine to Sit  Supine to sit: Min assist, Mod assist, HOB elevated, Used rails  General bed mobility comments: Pt requires assistance to safely achieve EOB sitting. severe posterior lean/LOB 3 x EOB with intervention to prevent posteriorly falling back into bed. Pt's balance was inconsistent throughout. Per spouse, He has  had several falls in past 6 months.    Transfers Overall transfer level: Needs assistance Equipment used: Rolling walker (2 wheels) Transfers: Sit to/from Stand Sit to Stand: Min assist, From elevated surface, Mod assist  General transfer comment: min assist to stand from elevated EOB surface. Mod assist to stand from recliner. pt tends to have posterior LOB at times. per pt/spouse. he has been going to OPPT prior to admission    Ambulation/Gait Ambulation/Gait assistance: Min assist Gait Distance (Feet): 5 Feet Assistive device: Rolling walker (2 wheels) Gait Pattern/deviations: Step-to pattern, Leaning posteriorly Gait velocity: decreased.  General Gait Details: Pt only ambulated from EOB ~ 5 ft away to recliner. pt has 2 occasions of unsteadiness. reliant on RW. Per spouse, pt sometimes only uses SPC at home. currently unsafe to attemopt any ambulation without RW. Highly recommend use of gait belt +2 assistance.    Balance Overall balance assessment: Needs assistance Sitting-balance support: No upper extremity supported, Feet supported Sitting balance-Leahy Scale: Poor     Standing balance support: Bilateral upper extremity supported Standing balance-Leahy Scale: Poor     Cognition Arousal: Alert Behavior During Therapy: WFL for tasks assessed/performed Overall Cognitive Status: History of cognitive impairments - at baseline    General Comments: pt is alert and cooperative. supportive spouse able to concern baline cognition and abilities. Pt does lack insight of situation and safety concerns               Pertinent Vitals/Pain Pain Assessment Pain Assessment: No/denies pain     PT Goals (current goals can now be found in the care plan section) Acute Rehab PT Goals Patient Stated Goal: go home and watch my cowboy shows Progress towards PT goals: Progressing toward goals    Frequency    Min 1X/week  Co-evaluation     PT goals addressed during  session: Mobility/safety with mobility;Balance;Proper use of DME;Strengthening/ROM        AM-PAC PT 6 Clicks Mobility   Outcome Measure  Help needed turning from your back to your side while in a flat bed without using bedrails?: A Little Help needed moving from lying on your back to sitting on the side of a flat bed without using bedrails?: A Lot Help needed moving to and from a bed to a chair (including a wheelchair)?: A Lot Help needed standing up from a chair using your arms (e.g., wheelchair or bedside chair)?: A Lot Help needed to walk in hospital room?: A Lot Help needed climbing 3-5 steps with a railing? : A Lot 6 Click Score: 13    End of Session   Activity Tolerance: Patient tolerated treatment well Patient left: in chair;with call bell/phone within reach;with chair alarm set;with family/visitor present Nurse Communication: Mobility status PT Visit Diagnosis: Muscle weakness (generalized) (M62.81);Difficulty in walking, not elsewhere classified (R26.2)     Time: 9098-9075 PT Time Calculation (min) (ACUTE ONLY): 23 min  Charges:    $Therapeutic Activity: 23-37 mins PT General Charges $$ ACUTE PT VISIT: 1 Visit                     Rankin Essex PTA 05/01/23, 10:59 AM

## 2023-05-01 NOTE — TOC Initial Note (Signed)
 Transition of Care [facility]) - Initial/Assessment Note    Patient Details  Name: Brandon Clements MRN: [id number] Date of Birth: Sep 14, 1940  [facility]) CM/SW Contact:    Ladene Lady, LCSW Phone Number: 05/01/2023, 3:17 PM  Clinical Narrative:     CSW spoke with pt at bedside regarding rehab. Pt is hesitant to go to rehab and wants me to discuss with his wife. Pt reports he would rather discharge home with home health. Pt does not have a preference. Pt states he ambulates at home with a RW and has a wheelchair at home and a cane. Pt states he would like me to discuss with his wife but ultimately feels he can discharge home with [facility]. CSW attempted to contact wife but was unable to reach.               Expected Discharge Plan: Skilled Nursing Facility Barriers to Discharge: Continued Medical Work up   Patient Goals and CMS Choice Patient states their goals for this hospitalization and ongoing recovery are:: unsure if he wants SNF vs [facility] CMS Medicare.gov Compare Post Acute Care list provided to:: Patient Choice offered to / list presented to : Patient      Expected Discharge Plan and Services     Post Acute Care Choice: Skilled Nursing Facility Living arrangements for the past 2 months: Single Family Home                                      Prior Living Arrangements/Services Living arrangements for the past 2 months: Single Family Home Lives with:: Spouse Patient language and need for interpreter reviewed:: Yes Do you feel safe going back to the place where you live?: Yes      Need for Family Participation in Patient Care: Yes (Comment) Care giver support system in place?: Yes (comment) Current home services: DME    Activities of Daily Living   ADL Screening (condition at time of admission) Independently performs ADLs?: Yes (appropriate for developmental age) Is the patient deaf or have difficulty hearing?: No Does the patient have difficulty seeing,  even when wearing glasses/contacts?: No Does the patient have difficulty concentrating, remembering, or making decisions?: No  Permission Sought/Granted      Share Information with NAME: wife  Permission granted to share info w AGENCY: Home health        Emotional Assessment       Orientation: : Oriented to Self, Oriented to Place, Oriented to  Time, Oriented to Situation      Admission diagnosis:  Enteritis [K52.9] Weakness [R53.1] Orthostasis [I95.1] Weakness of both lower extremities [R29.898] Nausea and vomiting, unspecified vomiting type [R11.2] Patient Active Problem List   Diagnosis Date Noted   Weakness of both lower extremities 04/30/2023   Weakness 04/29/2023   Omental mass 04/29/2023   Diabetes mellitus type 2, noninsulin dependent (HCC) 04/29/2023   Essential hypertension 04/29/2023   Benign neoplasm of ascending colon    History of colon polyps    Benign neoplasm of transverse colon    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 [place] [address] Phone: [phone] Fax: [phone]     Social Drivers of Health (SDOH) Social History: SDOH Screenings   Food Insecurity: Food Insecurity Present (04/30/2023)  Housing: Low Risk  (04/30/2023)  Transportation Needs: No Transportation Needs (04/30/2023)  Utilities: Not At Risk (04/30/2023)  Social Connections: Moderately Isolated (04/30/2023)  Tobacco Use: High Risk (04/29/2023)   SDOH Interventions:     Readmission Risk Interventions     No data to display

## 2023-05-02 DIAGNOSIS — R531 Weakness: Secondary | ICD-10-CM | POA: Diagnosis not present

## 2023-05-02 LAB — BASIC METABOLIC PANEL
Anion gap: 11 (ref 5–15)
BUN: 10 mg/dL (ref 8–23)
CO2: 27 mmol/L (ref 22–32)
Calcium: 8.4 mg/dL — ABNORMAL LOW (ref 8.9–10.3)
Chloride: 100 mmol/L (ref 98–111)
Creatinine, Ser: 0.71 mg/dL (ref 0.61–1.24)
GFR, Estimated: >60 mL/min (ref >60)
Glucose, Bld: 121 mg/dL — ABNORMAL HIGH (ref 70–99)
Potassium: 3.7 mmol/L (ref 3.5–5.1)
Sodium: 138 mmol/L (ref 135–145)

## 2023-05-02 LAB — CBC
HCT: 37.6 % — ABNORMAL LOW (ref 39.0–52.0)
Hemoglobin: 13.1 g/dL (ref 13.0–17.0)
MCH: 31.8 pg (ref 26.0–34.0)
MCHC: 34.8 g/dL (ref 30.0–36.0)
MCV: 91.3 fL (ref 80.0–100.0)
Platelets: 147 10*3/uL — ABNORMAL LOW (ref 150–400)
RBC: 4.12 MIL/uL — ABNORMAL LOW (ref 4.22–5.81)
RDW: 12.4 % (ref 11.5–15.5)
WBC: 4.9 10*3/uL (ref 4.0–10.5)
nRBC: 0 % (ref 0.0–0.2)

## 2023-05-02 LAB — GLUCOSE, CAPILLARY
Glucose-Capillary: 117 mg/dL — ABNORMAL HIGH (ref 70–99)
Glucose-Capillary: 103 mg/dL — ABNORMAL HIGH (ref 70–99)
Glucose-Capillary: 106 mg/dL — ABNORMAL HIGH (ref 70–99)
Glucose-Capillary: 86 mg/dL (ref 70–99)
Glucose-Capillary: 92 mg/dL (ref 70–99)

## 2023-05-02 LAB — PHOSPHORUS: Phosphorus: 2.3 mg/dL — ABNORMAL LOW (ref 2.5–4.6)

## 2023-05-02 LAB — MAGNESIUM: Magnesium: 2 mg/dL (ref 1.7–2.4)

## 2023-05-02 MED ORDER — K PHOS MONO-SOD PHOS DI & MONO 155-852-130 MG PO TABS
500.0000 mg | ORAL_TABLET | Freq: Once | ORAL | Status: AC
  Administered 2023-05-02: 500 mg via ORAL
  Filled 2023-05-02: qty 2

## 2023-05-02 MED ORDER — QUETIAPINE FUMARATE 25 MG PO TABS
25.0000 mg | ORAL_TABLET | Freq: Two times a day (BID) | ORAL | Status: DC
  Administered 2023-05-02 – 2023-05-03 (×3): 25 mg via ORAL
  Filled 2023-05-02 (×2): qty 1

## 2023-05-02 NOTE — TOC Progression Note (Signed)
 Transition of Care [facility]) - Progression Note    Patient Details  Name: Brandon Clements MRN: [id number] Date of Birth: 1940/10/31  [facility]) CM/SW Contact  Ladene Lady, LCSW Phone Number: 05/02/2023, 2:19 PM  Clinical Narrative:   CSW Spoke with patient and spouse at bedside they would now like rehab. Their first choice [facility].      Expected Discharge Plan: Skilled Nursing Facility Barriers to Discharge: Continued Medical Work up  Expected Discharge Plan and Services     Post Acute Care Choice: Skilled Nursing Facility Living arrangements for the past 2 months: Single Family Home                                       Social Determinants of Health (SDOH) Interventions SDOH Screenings   Food Insecurity: Food Insecurity Present (04/30/2023)  Housing: Low Risk  (04/30/2023)  Transportation Needs: No Transportation Needs (04/30/2023)  Utilities: Not At Risk (04/30/2023)  Social Connections: Moderately Isolated (04/30/2023)  Tobacco Use: High Risk (04/29/2023)    Readmission Risk Interventions     No data to display

## 2023-05-02 NOTE — Progress Notes (Signed)
 [facility] Progress Note  Patient: Brandon Clements    FMW:[id number]  DOA: 04/29/2023     Date of Service: the patient was seen and examined on 05/02/2023  Chief Complaint  Patient presents with   Fall   Brief hospital course: Brandon Clements is a 83 year old male with history of hypertension, hyperlipidemia, GERD, non-insulin -dependent diabetes mellitus, who presents emergency department for chief concerns of weakness in the bilateral legs and multiple falls. Patient was exposed to norovirus at home by his grandson.  Patient is having diarrhea for past 3 days.  Vitals in the ED showed temperature of 98.6, respiration rate of 18, heart rate 97, blood pressure 162/82, SpO2 of 98% on room air.   While in the ED, patient developed fever of 100.6.   Serum sodium is 138, potassium 3.4, chloride 100, bicarb 24, BUN of 11, serum creatinine 0.88, EGFR greater than 60, nonfasting blood glucose 220, WBC 12.8, hemoglobin 15.2, platelets of 152.   COVID/influenza A/influenza B/RSV PCR were negative.   Lactic acid was 1.7 and on repeat is 1.5.  Blood cultures x 2 have been collected and are in process.   CT abdomen pelvis w cm: Was read as multiple fluid-filled loops of small and large bowel with a few mildly dilated loops of small bowel in the central abdomen.  Findings are nonspecific but could reflect enteritis or ileus.  No evidence of high-grade bowel obstruction or perforation. New indeterminate, partially solid, noncalcified nodule in the midline omentum, possibly necrotic lymph node.  This could be atypical or metastatic head/neck cancer.  Consider further CT PET/CT or abdominal CT in 3 to 6 months.   Cholelithiasis without evidence of cholecystitis or biliary ductal dilatation.   CT head wo contrast: Read as no acute or traumatic finding, generalized atrophy.  Metallic density foreign object in the region of the right upper orbit on the left.   Assessment and Plan:  # Norovirus  gastroenteritis.   Diarrhea resolved, no diarrhea since 1/8 Continue oral hydration Monitor electrolytes   # Bilateral lower extremity weakness, most likely due to dehydration and electrolyte imbalance secondary to norovirus infection. Seen by neurology, less likely Guillain-Barr syndrome, no further workup needed Ct Head: 1. No acute or traumatic finding. Generalized atrophy. 2. 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 Metallic density foreign object in the region of the upper orbit on the left. MRI cannot be ordered due to metallic foreign body  PT/OT eval done, recommend SNF placement Continue to monitor on telemetry Continue fall precautions TOC following for disposition plan  # Hypokalemia, potassium repleted. # Hypophosphatemia, Phos repleted. # Hypomagnesemia, mag repleted. Monitor electrolytes.   # Essential hypertension Home metoprolol  tartrate 25 mg p.o. twice daily resumed Monitor BP and titrate medications accordingly  # Diabetes mellitus type 2, noninsulin dependent (HCC) Home glimepiride will not resumed on admission Insulin  SSI with at bedtime coverage ordered   # Omental mass Per CT read: New indeterminate, partially solid noncalcified nodule in the midline omentum, possibly a necrotic lymph node. Recommend outpatient follow-up PET/CT or abdominal CT in 3 to 6 months outpatient   # GERD Home PPI equivalent dosing resumed   # Coronary atherosclerosis Patient reports he is no longer taking aspirin Home simvastatin  20 mg every evening resumed  # Acute delirium noticed on 1/9, patient pulled out IV line.  No agitation Started Seroquel  25 mg p.o. twice daily   Body mass index is 26.21 kg/m.  Interventions:  Diet: Heart healthy/carb modified diet DVT Prophylaxis: Subcutaneous Lovenox    Advance goals of care discussion: Full code  Family Communication: family was present at bedside, at the time of interview.  The pt  provided permission to discuss medical plan with the family. Opportunity was given to ask question and all questions were answered satisfactorily.   Disposition:  Pt is from Home, admitted with Fall and weakness of lower ext, norovirus positive, clinically stable, medically optimized to discharge.   Discharge to SNF, when bed will be available. Follow TOC for disposition plan, stable to DC.  Subjective: No significant events overnight, diarrhea resolved, patient had 1 BM today, normal consistency. Denied any abdominal pain, no nausea vomiting.  No chest pain or palpitation, no nasal complaints.  Physical Exam: General: NAD, lying comfortably Appear in no distress, affect appropriate Eyes: PERRLA ENT: Oral Mucosa Clear, moist  Neck: no JVD,  Cardiovascular: S1 and S2 Present, no Murmur,  Respiratory: good respiratory effort, Bilateral Air entry equal and Decreased, no Crackles, no wheezes Abdomen: Bowel Sound present, Soft and no tenderness,  Skin: no rashes Extremities: no Pedal edema, no calf tenderness Neurologic: without any new focal findings, mild weakness in the left lower extremities, power 4-5/5 Gait not checked due to patient safety concerns  Vitals:   05/01/23 1556 05/01/23 2004 05/02/23 0459 05/02/23 0808  BP: 124/74 137/82 (!) 158/74 (!) 155/85  Pulse: 63 64 74 75  Resp: 16 18 18 16   Temp: 98.4 F (36.9 C) (!) 97.5 F (36.4 C) 97.6 F (36.4 C) 97.8 F (36.6 C)  TempSrc:      SpO2: 99% 98% 97% 94%  Weight:      Height:        Intake/Output Summary (Last 24 hours) at 05/02/2023 1352 Last data filed at 05/02/2023 0900 Gross per 24 hour  Intake 480 ml  Output 450 ml  Net 30 ml   Filed Weights   04/30/23 1500  Weight: 80.5 kg    Data Reviewed: I have personally reviewed and interpreted daily labs, tele strips, imagings as discussed above. I reviewed all nursing notes, pharmacy notes, vitals, pertinent old records I have discussed plan of care as described  above with RN and patient/family.  CBC: Recent Labs  Lab 04/29/23 0543 04/30/23 1454 05/01/23 0457 05/02/23 0439  WBC 12.8* 7.7 5.4 4.9  NEUTROABS 11.8*  --   --   --   HGB 15.2 13.0 13.1 13.1  HCT 45.0 38.3* 38.2* 37.6*  MCV 95.9 93.2 94.3 91.3  PLT 159 137* 128* 147*   Basic Metabolic Panel: Recent Labs  Lab 04/29/23 0543 04/29/23 1706 04/30/23 1454 05/01/23 0457 05/02/23 0439  NA 138  --  136 138 138  K 3.4*  --  3.3* 3.4* 3.7  CL 100  --  101 101 100  CO2 25  --  25 27 27   GLUCOSE 220*  --  129* 97 121*  BUN 11  --  17 14 10   CREATININE 0.88  --  0.72 0.63 0.71  CALCIUM 9.2  --  8.5* 8.7* 8.4*  MG  --  1.6* 2.1 2.1 2.0  PHOS  --   --  2.9 2.4* 2.3*    Studies: No results found.   Scheduled Meds:   stroke: early stages of recovery book   Does not apply Once   enoxaparin  (LOVENOX ) injection  40 mg Subcutaneous QPM   insulin  aspart  0-15 Units Subcutaneous TID WC   insulin  aspart  0-5  Units Subcutaneous QHS   metoprolol  tartrate  25 mg Oral BID   pantoprazole   80 mg Oral QHS   QUEtiapine   25 mg Oral BID   simvastatin   20 mg Oral QPM   Continuous Infusions:   PRN Meds: acetaminophen  **OR** acetaminophen  (TYLENOL ) oral liquid 160 mg/5 mL **OR** acetaminophen , fluticasone , senna-docusate  Time spent: 35 minutes  Author: ELVAN SOR. MD [facility] 05/02/2023 1:52 PM  To reach On-call, see care teams to locate the attending and reach out to them via [email or web address]. If 7PM-7AM, please contact night-coverage If you still have difficulty reaching the attending provider, please page the [facility] (Director on Call) for [facility] on amion for assistance.

## 2023-05-02 NOTE — TOC Progression Note (Signed)
 Transition of Care [facility]) - Progression Note    Patient Details  Name: Brandon Clements MRN: [id number] Date of Birth: 1940/08/21  Transition of [facility]) CM/SW Contact  Ladene Lady, LCSW Phone Number: 05/02/2023, 3:01 PM  Clinical Narrative:   Shara started for [facility].     Expected Discharge Plan: Skilled Nursing Facility Barriers to Discharge: Continued Medical Work up  Expected Discharge Plan and Services     Post Acute Care Choice: Skilled Nursing Facility Living arrangements for the past 2 months: Single Family Home                                       Social Determinants of Health (SDOH) Interventions SDOH Screenings   Food Insecurity: Food Insecurity Present (04/30/2023)  Housing: Low Risk  (04/30/2023)  Transportation Needs: No Transportation Needs (04/30/2023)  Utilities: Not At Risk (04/30/2023)  Social Connections: Moderately Isolated (04/30/2023)  Tobacco Use: High Risk (04/29/2023)    Readmission Risk Interventions     No data to display

## 2023-05-02 NOTE — NC FL2 (Signed)
 [place]  MEDICAID FL2 LEVEL OF CARE FORM     IDENTIFICATION  Patient Name: Brandon Clements Birthdate: July 20, 1940 Sex: male Admission Date (Current Location): 04/29/2023  [facility] and [place] Number:  Chiropodist and Address:  [facility], [address] [place], [place] [address]      Provider Number: [id number]  Attending Physician Name and Address:  Von Bellis, MD  Relative Name and Phone Number:   SULEIMAN, FINIGAN (Spouse)  [phone])    Current Level of Care: Hospital Recommended Level of Care: Skilled Nursing Facility Prior Approval Number:    Date Approved/Denied:   PASRR Number: [id number]  Discharge Plan: SNF    Current Diagnoses: Patient Active Problem List   Diagnosis Date Noted   Weakness of both lower extremities 04/30/2023   Weakness 04/29/2023   Omental mass 04/29/2023   Diabetes mellitus type 2, noninsulin dependent (HCC) 04/29/2023   Essential hypertension 04/29/2023   Benign neoplasm of ascending colon    History of colon polyps    Benign neoplasm of transverse colon    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   Coronary atherosclerosis 04/02/2008   GERD 04/02/2008   History of colonic polyps 04/02/2008   Esophagitis 04/01/2008   BARRETTS ESOPHAGUS 04/01/2008   Diaphragmatic hernia 04/01/2008   SKIN CANCER, HX OF 04/01/2008   NEPHROLITHIASIS, HX OF 04/01/2008    Orientation RESPIRATION BLADDER Height & Weight     Self, Time, Situation, Place  Normal Continent Weight: 177 lb 7.5 oz (80.5 kg) Height:  5' 9 (175.3 cm)  BEHAVIORAL SYMPTOMS/MOOD NEUROLOGICAL BOWEL NUTRITION STATUS      Continent    AMBULATORY STATUS COMMUNICATION OF NEEDS Skin   Supervision Verbally Normal                       Personal Care Assistance Level of Assistance  Bathing, Feeding, Dressing Bathing Assistance: Limited assistance Feeding assistance: Limited assistance Dressing Assistance: Limited  assistance     Functional Limitations Info  Sight, Hearing, Speech Sight Info: Impaired Hearing Info: Impaired Speech Info: Adequate    SPECIAL CARE FACTORS FREQUENCY  PT (By licensed PT), OT (By licensed OT)     PT Frequency: 5 times a week OT Frequency: 5 times a week            Contractures Contractures Info: Not present    Additional Factors Info  Code Status, Isolation Precautions Code Status Info: FULL       Isolation Precautions Info: enteric     Current Medications (05/02/2023):  This is the current hospital active medication list Current Facility-Administered Medications  Medication Dose Route Frequency Provider Last Rate Last Admin    stroke: early stages of recovery book   Does not apply Once Cox, Amy N, DO       acetaminophen  (TYLENOL ) tablet 650 mg  650 mg Oral Q4H PRN Cox, Amy N, DO       Or   acetaminophen  (TYLENOL ) 160 MG/5ML solution 650 mg  650 mg Per Tube Q4H PRN Cox, Amy N, DO       Or   acetaminophen  (TYLENOL ) suppository 650 mg  650 mg Rectal Q4H PRN Cox, Amy N, DO       enoxaparin  (LOVENOX ) injection 40 mg  40 mg Subcutaneous QPM Von Bellis, MD   40 mg at 05/01/23 2306   fluticasone  (FLONASE ) 50 MCG/ACT nasal spray 1 spray  1 spray Each Nare Daily PRN  Cox, Amy N, DO       insulin  aspart (novoLOG ) injection 0-15 Units  0-15 Units Subcutaneous TID WC Cox, Amy N, DO   2 Units at 04/30/23 1814   insulin  aspart (novoLOG ) injection 0-5 Units  0-5 Units Subcutaneous QHS Cox, Amy N, DO       metoprolol  tartrate (LOPRESSOR ) tablet 25 mg  25 mg Oral BID Von Bellis, MD   25 mg at 05/02/23 9093   pantoprazole  (PROTONIX ) EC tablet 80 mg  80 mg Oral QHS Cox, Amy N, DO   80 mg at 05/01/23 2306   QUEtiapine  (SEROQUEL ) tablet 25 mg  25 mg Oral BID Von Bellis, MD       senna-docusate (Senokot-S) tablet 1 tablet  1 tablet Oral QHS PRN Cox, Amy N, DO       simvastatin  (ZOCOR ) tablet 20 mg  20 mg Oral QPM Cox, Amy N, DO   20 mg at 05/01/23 1755      Discharge Medications: Please see discharge summary for a list of discharge medications.  Relevant Imaging Results:  Relevant Lab Results:   Additional Information [id number]  Ladene Lady, LCSW

## 2023-05-02 NOTE — Plan of Care (Signed)

## 2023-05-03 DIAGNOSIS — R531 Weakness: Secondary | ICD-10-CM | POA: Diagnosis not present

## 2023-05-03 LAB — BASIC METABOLIC PANEL
Anion gap: 10 (ref 5–15)
BUN: 7 mg/dL — ABNORMAL LOW (ref 8–23)
CO2: 27 mmol/L (ref 22–32)
Calcium: 8.6 mg/dL — ABNORMAL LOW (ref 8.9–10.3)
Chloride: 104 mmol/L (ref 98–111)
Creatinine, Ser: 0.66 mg/dL (ref 0.61–1.24)
GFR, Estimated: >60 mL/min (ref >60)
Glucose, Bld: 85 mg/dL (ref 70–99)
Potassium: 3.2 mmol/L — ABNORMAL LOW (ref 3.5–5.1)
Sodium: 141 mmol/L (ref 135–145)

## 2023-05-03 LAB — GLUCOSE, CAPILLARY
Glucose-Capillary: 122 mg/dL — ABNORMAL HIGH (ref 70–99)
Glucose-Capillary: 107 mg/dL — ABNORMAL HIGH (ref 70–99)
Glucose-Capillary: 139 mg/dL — ABNORMAL HIGH (ref 70–99)
Glucose-Capillary: 103 mg/dL — ABNORMAL HIGH (ref 70–99)

## 2023-05-03 LAB — CBC
HCT: 37.4 % — ABNORMAL LOW (ref 39.0–52.0)
Hemoglobin: 12.8 g/dL — ABNORMAL LOW (ref 13.0–17.0)
MCH: 31.9 pg (ref 26.0–34.0)
MCHC: 34.2 g/dL (ref 30.0–36.0)
MCV: 93.3 fL (ref 80.0–100.0)
Platelets: 154 10*3/uL (ref 150–400)
RBC: 4.01 MIL/uL — ABNORMAL LOW (ref 4.22–5.81)
RDW: 12.6 % (ref 11.5–15.5)
WBC: 3.8 10*3/uL — ABNORMAL LOW (ref 4.0–10.5)
nRBC: 0 % (ref 0.0–0.2)

## 2023-05-03 LAB — MAGNESIUM: Magnesium: 1.9 mg/dL (ref 1.7–2.4)

## 2023-05-03 LAB — PHOSPHORUS: Phosphorus: 3.2 mg/dL (ref 2.5–4.6)

## 2023-05-03 MED ORDER — MENTHOL 3 MG MT LOZG: 1.0000 | LOZENGE | OROMUCOSAL | Status: DC | PRN

## 2023-05-03 MED ORDER — QUETIAPINE FUMARATE 25 MG PO TABS
25.0000 mg | ORAL_TABLET | Freq: Every evening | ORAL | Status: DC
  Administered 2023-05-03 – 2023-05-05 (×3): 25 mg via ORAL
  Filled 2023-05-03 (×3): qty 1

## 2023-05-03 MED ORDER — POTASSIUM CHLORIDE CRYS ER 20 MEQ PO TBCR
40.0000 meq | EXTENDED_RELEASE_TABLET | ORAL | Status: AC
  Administered 2023-05-03 (×2): 40 meq via ORAL
  Filled 2023-05-03 (×2): qty 2

## 2023-05-03 MED ORDER — QUETIAPINE FUMARATE 25 MG PO TABS: 25.0000 mg | ORAL_TABLET | Freq: Every evening | ORAL | Status: AC

## 2023-05-03 NOTE — Progress Notes (Signed)
 Physical Therapy Treatment Patient Details Name: Brandon Clements MRN: [id number] DOB: 01/26/1941 Today's Date: 05/03/2023   History of Present Illness 83 year old male with history of hypertension, hyperlipidemia, GERD, non-insulin -dependent diabetes mellitus, who presents emergency department for chief concerns of weakness in the bilateral legs and multiple falls. +Norovirus.    PT Comments  Patient much more alert and oriented this date.  Accurately states name, location, situation; follows simple commands.  Generally impulsive with limited insight into deficits; difficulty with recall/integration of new learning. Able to initiate/progress gait training with RW, min assist +2 (chair follow) for safety.  Demonstrates partially reciprocal stepping pattern with fair step height/length; slightly steppage gait L LE with limited heel strike/toe off. Frequent sway/LOB with head turns and divided attention tasks/activities; constant min assist for safety and balance correction. Continue to recommend use of RW and +1 physical assist for all functional activities and ADLs. Discharge recommendations remain appropriate for post-acute rehab services.     If plan is discharge home, recommend the following: A lot of help with walking and/or transfers;A lot of help with bathing/dressing/bathroom;Assistance with cooking/housework;Direct supervision/assist for medications management;Direct supervision/assist for financial management;Assist for transportation;Help with stairs or ramp for entrance;Supervision due to cognitive status   Can travel by private vehicle     Yes  Equipment Recommendations       Recommendations for Other Services       Precautions / Restrictions Precautions Precautions: Fall Restrictions Weight Bearing Restrictions Per Provider Order: No     Mobility  Bed Mobility Overal bed mobility: Needs Assistance Bed Mobility: Supine to Sit     Supine to sit: Min assist, Used rails,  HOB elevated          Transfers Overall transfer level: Needs assistance Equipment used: Rolling walker (2 wheels) Transfers: Sit to/from Stand, Bed to chair/wheelchair/BSC Sit to Stand: Min assist, +2 safety/equipment Stand pivot transfers: Min assist, +2 safety/equipment         General transfer comment: VC for hand placement, feet placement, RW;  L LE tends to slide/extend forward if not positioned appropriately with initial lift off    Ambulation/Gait Ambulation/Gait assistance: Min assist, +2 safety/equipment Gait Distance (Feet):  (60' x2) Assistive device: Rolling walker (2 wheels)         General Gait Details: partially reciprocal stepping pattern with fair step height/length; slightly steppage gait L LE with limited heel strike/toe off.  Frequent sway/LOB with head turns and divided attention tasks/activities; constant min assist for safety and balance correction.   Stairs             Wheelchair Mobility     Tilt Bed    Modified Rankin (Stroke Patients Only)       Balance Overall balance assessment: Needs assistance Sitting-balance support: No upper extremity supported, Feet supported Sitting balance-Leahy Scale: Fair Sitting balance - Comments: Unsupported sitting balance, close sup this date   Standing balance support: Bilateral upper extremity supported, Reliant on assistive device for balance Standing balance-Leahy Scale: Fair                              Cognition Arousal: Alert Behavior During Therapy: WFL for tasks assessed/performed Overall Cognitive Status: History of cognitive impairments - at baseline                                 General Comments: Pt alert,  pleasant, and agreeable. Supportive spouse present. Pt requires cues for safety/sequencing.  Orientation much improved, but decreased recall, integration of new learning noted        Exercises      General Comments        Pertinent  Vitals/Pain Pain Assessment Pain Assessment: No/denies pain    Home Living                          Prior Function            PT Goals (current goals can now be found in the care plan section) Acute Rehab PT Goals Patient Stated Goal: go home and watch my cowboy shows PT Goal Formulation: With patient Time For Goal Achievement: 05/14/23 Potential to Achieve Goals: Fair Progress towards PT goals: Progressing toward goals    Frequency    Min 1X/week      PT Plan      Co-evaluation              AM-PAC PT 6 Clicks Mobility   Outcome Measure  Help needed turning from your back to your side while in a flat bed without using bedrails?: A Little Help needed moving from lying on your back to sitting on the side of a flat bed without using bedrails?: A Little Help needed moving to and from a bed to a chair (including a wheelchair)?: A Little Help needed standing up from a chair using your arms (e.g., wheelchair or bedside chair)?: A Little Help needed to walk in hospital room?: A Little Help needed climbing 3-5 steps with a railing? : A Lot 6 Click Score: 17    End of Session Equipment Utilized During Treatment: Gait belt Activity Tolerance: Patient tolerated treatment well Patient left: in chair;with call bell/phone within reach;with chair alarm set Nurse Communication: Mobility status PT Visit Diagnosis: Muscle weakness (generalized) (M62.81);Difficulty in walking, not elsewhere classified (R26.2)     Time: 1405-1430 PT Time Calculation (min) (ACUTE ONLY): 25 min  Charges:    $Gait Training: 8-22 mins $Therapeutic Activity: 8-22 mins PT General Charges $$ ACUTE PT VISIT: 1 Visit                     Alesia Oshields H. Delores, PT, DPT, NCS 05/03/23, 3:50 PM [phone]

## 2023-05-03 NOTE — Progress Notes (Signed)
 Occupational Therapy Treatment Patient Details Name: Brandon Clements MRN: [id number] DOB: [birth year] Today's Date: 05/03/2023   History of present illness 83 year old male with history of hypertension, hyperlipidemia, GERD, non-insulin -dependent diabetes mellitus, who presents emergency department for chief concerns of weakness in the bilateral legs and multiple falls. +Norovirus.   OT comments  Pt seen for OT tx. Spouse present and supportive. Pt oriented to self only, pleasant, and agreeable. Pt continued to require cues for safety/sequencing during tasks and continues to demonstrate poor balance, but improving since last session with this author. Pt required MIN A for bed mobility, initial MOD A for static sitting balance with BUE support and cues to improve and eventually requiring varying assist  (supv-MIN A). Pt tolerated sitting EOB for ~47min for grooming tasks and LB dressing. Setup and assist for balance to complete brushing teeth, washing face, and shaving with personal electric razor. MIN A for donning underwear with notable small LOBs while in standing and alternating UE use to complete donning over hips and requiring EOB to brace himself and prevent posterior LOB. Pt continues to benefit from skilled OT services.       If plan is discharge home, recommend the following:  A lot of help with bathing/dressing/bathroom;Assistance with cooking/housework;Assist for transportation;Help with stairs or ramp for entrance;Direct supervision/assist for medications management;Supervision due to cognitive status;A lot of help with walking and/or transfers   Equipment Recommendations  Other (comment) (defer)    Recommendations for Other Services      Precautions / Restrictions Precautions Precautions: Fall Restrictions Weight Bearing Restrictions Per Provider Order: No       Mobility Bed Mobility Overal bed mobility: Needs Assistance Bed Mobility: Supine to Sit, Sit to Supine      Supine to sit: Min assist, HOB elevated, Used rails Sit to supine: HOB elevated, Min assist   General bed mobility comments: increased time/effort, heavy use of bed rail    Transfers Overall transfer level: Needs assistance Equipment used: Rolling walker (2 wheels) Transfers: Sit to/from Stand Sit to Stand: Mod assist           General transfer comment: VC for hand placement, feet placement, RW     Balance Overall balance assessment: Needs assistance Sitting-balance support: No upper extremity supported, Feet supported Sitting balance-Leahy Scale: Poor Sitting balance - Comments: required initial MOD A to maintain static sitting balance, improving with time and VC to varying assist levels: supv-SBA-CGA-MIN A Postural control: Posterior lean, Right lateral lean Standing balance support: Reliant on assistive device for balance, Single extremity supported, Bilateral upper extremity supported, During functional activity Standing balance-Leahy Scale: Poor Standing balance comment: able to briefly let go with one hand to alternate pulling up underwear, bracing against the EOB, MIN A for a couple slight LOBs                           ADL either performed or assessed with clinical judgement   ADL Overall ADL's : Needs assistance/impaired     Grooming: Sitting;Supervision/safety;Contact guard assist;Minimal assistance;Set up;Wash/dry face;Oral care Grooming Details (indicate cue type and reason): supv to MIN A to support sitting balance, PRN VC for sequencing of tasks. Also shaved using personal electric razor EOB. Sat EOB for ~25 min             Lower Body Dressing: Minimal assistance;Sit to/from stand  Extremity/Trunk Assessment              Vision       Perception     Praxis      Cognition Arousal: Alert Behavior During Therapy: WFL for tasks assessed/performed Overall Cognitive Status: History of cognitive impairments  - at baseline                                 General Comments: Pt alert, pleasant, and agreeable. Supportive spouse present. Pt requires cues for safety/sequencing        Exercises Other Exercises Other Exercises: Marched in place with RW to improve balance and activity tolerance while dual tasking engaging spouse in conversation    Shoulder Instructions       General Comments      Pertinent Vitals/ Pain       Pain Assessment Pain Assessment: No/denies pain  Home Living                                          Prior Functioning/Environment              Frequency  Min 1X/week        Progress Toward Goals  OT Goals(current goals can now be found in the care plan section)  Progress towards OT goals: Progressing toward goals  Acute Rehab OT Goals Patient Stated Goal: get stronger and go home OT Goal Formulation: With patient/family Time For Goal Achievement: 05/14/23 Potential to Achieve Goals: Good  Plan      Co-evaluation                 AM-PAC OT 6 Clicks Daily Activity     Outcome Measure   Help from another person eating meals?: None Help from another person taking care of personal grooming?: A Little Help from another person toileting, which includes using toliet, bedpan, or urinal?: A Lot Help from another person bathing (including washing, rinsing, drying)?: A Lot Help from another person to put on and taking off regular upper body clothing?: A Little Help from another person to put on and taking off regular lower body clothing?: A Lot 6 Click Score: 16    End of Session Equipment Utilized During Treatment: Rolling walker (2 wheels)  OT Visit Diagnosis: Other abnormalities of gait and mobility (R26.89);Repeated falls (R29.6);Muscle weakness (generalized) (M62.81)   Activity Tolerance Patient tolerated treatment well   Patient Left in bed;with call bell/phone within reach;with bed alarm set;with  family/visitor present   Nurse Communication          Time: 8982-8943 OT Time Calculation (min): 39 min  Charges: OT General Charges $OT Visit: 1 Visit OT Treatments $Self Care/Home Management : 23-37 mins $Therapeutic Activity: 8-22 mins  Warren SAUNDERS., MPH, MS, OTR/L ascom [phone] 05/03/23, 12:16 PM

## 2023-05-03 NOTE — TOC Transition Note (Signed)
 Transition of Care [facility]) - Discharge Note   Patient Details  Name: Brandon Clements MRN: [id number] Date of Birth: 06/27/1940  Transition of Care [facility]) CM/SW Contact:  Ladene Lady, LCSW Phone Number: 05/03/2023, 2:31 PM   Clinical Narrative:  Pt has orders to discharge to [facility]. RN given number for report. Dc summary to be sent. Wife kay notified. [name] to transport patient back to their facility.      Final next level of care: Skilled Nursing Facility Barriers to Discharge: Barriers Resolved   Patient Goals and CMS Choice Patient states their goals for this hospitalization and ongoing recovery are:: Go to [facility].gov Compare Post Acute Care list provided to:: Patient Choice offered to / list presented to : Patient      Discharge Placement              Patient chooses bed at: [facility] Patient to be transferred to facility by: [facility] Name of family member notified: wife kay Patient and family notified of of transfer: 05/03/23  Discharge Plan and Services Additional resources added to the After Visit Summary for       Post Acute Care Choice: Skilled Nursing Facility                               Social Drivers of Health (SDOH) Interventions SDOH Screenings   Food Insecurity: Food Insecurity Present (04/30/2023)  Housing: Low Risk  (04/30/2023)  Transportation Needs: No Transportation Needs (04/30/2023)  Utilities: Not At Risk (04/30/2023)  Social Connections: Moderately Isolated (04/30/2023)  Tobacco Use: High Risk (04/29/2023)     Readmission Risk Interventions     No data to display

## 2023-05-03 NOTE — Progress Notes (Signed)
 [facility] Progress Note  Patient: Brandon Clements    FMW:[id number]  DOA: 04/29/2023     Date of Service: the patient was seen and examined on 05/03/2023  Chief Complaint  Patient presents with   Fall   Brief hospital course: Brandon Clements is a 83 year old male with history of hypertension, hyperlipidemia, GERD, non-insulin -dependent diabetes mellitus, who presents emergency department for chief concerns of weakness in the bilateral legs and multiple falls. Patient was exposed to norovirus at home by his grandson.  Patient is having diarrhea for past 3 days.  Vitals in the ED showed temperature of 98.6, respiration rate of 18, heart rate 97, blood pressure 162/82, SpO2 of 98% on room air.   While in the ED, patient developed fever of 100.6.   Serum sodium is 138, potassium 3.4, chloride 100, bicarb 24, BUN of 11, serum creatinine 0.88, EGFR greater than 60, nonfasting blood glucose 220, WBC 12.8, hemoglobin 15.2, platelets of 152.   COVID/influenza A/influenza B/RSV PCR were negative.   Lactic acid was 1.7 and on repeat is 1.5.  Blood cultures x 2 have been collected and are in process.   CT abdomen pelvis w cm: Was read as multiple fluid-filled loops of small and large bowel with a few mildly dilated loops of small bowel in the central abdomen.  Findings are nonspecific but could reflect enteritis or ileus.  No evidence of high-grade bowel obstruction or perforation. New indeterminate, partially solid, noncalcified nodule in the midline omentum, possibly necrotic lymph node.  This could be atypical or metastatic head/neck cancer.  Consider further CT PET/CT or abdominal CT in 3 to 6 months.   Cholelithiasis without evidence of cholecystitis or biliary ductal dilatation.   CT head wo contrast: Read as no acute or traumatic finding, generalized atrophy.  Metallic density foreign object in the region of the right upper orbit on the left.   Assessment and Plan:  # Norovirus  gastroenteritis.   Diarrhea resolved, no diarrhea since 1/8 Continue oral hydration Monitor electrolytes   # Bilateral lower extremity weakness, most likely due to dehydration and electrolyte imbalance secondary to norovirus infection. Seen by neurology, less likely Guillain-Barr syndrome, no further workup needed Ct Head: 1. No acute or traumatic finding. Generalized atrophy. 2. 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 Metallic density foreign object in the region of the upper orbit on the left. MRI cannot be ordered due to metallic foreign body  PT/OT eval done, recommend SNF placement Continue to monitor on telemetry Continue fall precautions TOC following for disposition plan  # Hypokalemia, potassium repleted. # Hypophosphatemia, Phos repleted. # Hypomagnesemia, mag repleted. Monitor electrolytes.   # Essential hypertension Home metoprolol  tartrate 25 mg p.o. twice daily resumed Monitor BP and titrate medications accordingly  # Diabetes mellitus type 2, noninsulin dependent (HCC) Home glimepiride held for now, resume on dc Insulin  SSI with at bedtime coverage ordered   # Omental mass Per CT read: New indeterminate, partially solid noncalcified nodule in the midline omentum, possibly a necrotic lymph node. Recommend outpatient follow-up PET/CT or abdominal CT in 3 to 6 months outpatient   # GERD Home PPI equivalent dosing resumed   # Coronary atherosclerosis Patient reports he is no longer taking aspirin Home simvastatin  20 mg every evening resumed  # Acute delirium noticed on 1/9, patient pulled out IV line.  No agitation Started Seroquel  25 mg p.o. twice daily 1/10 decreased Seroquel  25 mg po every evening  Body mass index is 26.21 kg/m.  Interventions:  Diet: Heart healthy/carb modified diet DVT Prophylaxis: Subcutaneous Lovenox    Advance goals of care discussion: Full code  Family Communication: family was present at  bedside, at the time of interview.  The pt provided permission to discuss medical plan with the family. Opportunity was given to ask question and all questions were answered satisfactorily.   Disposition:  Pt is from Home, admitted with Fall and weakness of lower ext, norovirus positive, clinically stable, medically optimized to discharge.   Discharge to SNF, when bed will be available. Follow TOC for disposition plan, stable to DC.  Subjective: No significant events overnight, diarrhea resolved.  No abdominal pain, no nausea vomiting. Awaiting for SNF placement.  Physical Exam: General: NAD, lying comfortably Appear in no distress, affect appropriate Eyes: PERRLA ENT: Oral Mucosa Clear, moist  Neck: no JVD,  Cardiovascular: S1 and S2 Present, no Murmur,  Respiratory: good respiratory effort, Bilateral Air entry equal and Decreased, no Crackles, no wheezes Abdomen: Bowel Sound present, Soft and no tenderness,  Skin: no rashes Extremities: no Pedal edema, no calf tenderness Neurologic: without any new focal findings, mild weakness in the left lower extremities, power 4-5/5 Gait not checked due to patient safety concerns  Vitals:   05/02/23 1750 05/02/23 2054 05/03/23 0456 05/03/23 0831  BP: 139/71 (!) 147/75 (!) 140/69 (!) 150/81  Pulse: (!) 59 67 64 72  Resp: 16 16 18 18   Temp: 98.1 F (36.7 C) 97.6 F (36.4 C) 97.7 F (36.5 C) 98 F (36.7 C)  TempSrc:      SpO2: 97% 96% 93% 94%  Weight:      Height:        Intake/Output Summary (Last 24 hours) at 05/03/2023 1051 Last data filed at 05/03/2023 0900 Gross per 24 hour  Intake 240 ml  Output 1175 ml  Net -935 ml   Filed Weights   04/30/23 1500  Weight: 80.5 kg    Data Reviewed: I have personally reviewed and interpreted daily labs, tele strips, imagings as discussed above. I reviewed all nursing notes, pharmacy notes, vitals, pertinent old records I have discussed plan of care as described above with RN and  patient/family.  CBC: Recent Labs  Lab 04/29/23 0543 04/30/23 1454 05/01/23 0457 05/02/23 0439 05/03/23 0548  WBC 12.8* 7.7 5.4 4.9 3.8*  NEUTROABS 11.8*  --   --   --   --   HGB 15.2 13.0 13.1 13.1 12.8*  HCT 45.0 38.3* 38.2* 37.6* 37.4*  MCV 95.9 93.2 94.3 91.3 93.3  PLT 159 137* 128* 147* 154   Basic Metabolic Panel: Recent Labs  Lab 04/29/23 0543 04/29/23 1706 04/30/23 1454 05/01/23 0457 05/02/23 0439 05/03/23 0548  NA 138  --  136 138 138 141  K 3.4*  --  3.3* 3.4* 3.7 3.2*  CL 100  --  101 101 100 104  CO2 25  --  25 27 27 27   GLUCOSE 220*  --  129* 97 121* 85  BUN 11  --  17 14 10  7*  CREATININE 0.88  --  0.72 0.63 0.71 0.66  CALCIUM 9.2  --  8.5* 8.7* 8.4* 8.6*  MG  --  1.6* 2.1 2.1 2.0 1.9  PHOS  --   --  2.9 2.4* 2.3* 3.2    Studies: No results found.   Scheduled Meds:   stroke: early stages of recovery book   Does not apply Once   enoxaparin  (LOVENOX ) injection  40 mg Subcutaneous QPM   insulin  aspart  0-15 Units Subcutaneous TID WC   insulin  aspart  0-5 Units Subcutaneous QHS   metoprolol  tartrate  25 mg Oral BID   pantoprazole   80 mg Oral QHS   potassium chloride   40 mEq Oral Q4H   QUEtiapine   25 mg Oral BID   simvastatin   20 mg Oral QPM   Continuous Infusions:   PRN Meds: acetaminophen  **OR** acetaminophen  (TYLENOL ) oral liquid 160 mg/5 mL **OR** acetaminophen , fluticasone , senna-docusate  Time spent: 35 minutes  Author: ELVAN SOR. MD [facility] 05/03/2023 10:51 AM  To reach On-call, see care teams to locate the attending and reach out to them via [email or web address]. If 7PM-7AM, please contact night-coverage If you still have difficulty reaching the attending provider, please page the [facility] (Director on Call) for [facility] on amion for assistance.

## 2023-05-03 NOTE — Discharge Summary (Addendum)
 [facility] Discharge Summary   Patient: Brandon Clements FMW:[id number]  PCP: Brandon Reyes BIRCH, MD  Date of admission: 04/29/2023   Date of discharge:  05/03/2023     Discharge Diagnoses:  Principal Problem:   Weakness Active Problems:   Coronary atherosclerosis   Esophagitis   GERD   Benign neoplasm of transverse colon   Benign neoplasm of ascending colon   Omental mass   Diabetes mellitus type 2, noninsulin dependent (HCC)   Essential hypertension   Weakness of both lower extremities   Admitted From: Home Disposition:  SNF   Recommendations for Outpatient Follow-up:  Follow-up with PCP, patient is to be seen by an MD in 1 to 2 days, repeat BMP in 1 week to check electrolytes. Follow up LABS/TEST:  BMP, Consider further CT PET/CT or abdominal CT in 3 to 6 month   Contact information for after-discharge care     Destination     [facility] Preferred SNF .   Service: Skilled Nursing Contact information: [address] [place]  [address] [phone]                    Diet recommendation: Cardiac diet  Activity: The patient is advised to gradually reintroduce usual activities, as tolerated  Discharge Condition: stable  Code Status: Full code   History of present illness: As per the H and P dictated on admission. Hospital Course:  [name] is a 83 year old male with history of hypertension, hyperlipidemia, GERD, non-insulin -dependent diabetes mellitus, who presents emergency department for chief concerns of weakness in the bilateral legs and multiple falls. Patient was exposed to norovirus at home by his grandson.  Patient is having diarrhea for past 3 days. Vitals in the ED showed temperature of 98.6, respiration rate of 18, heart rate 97, blood pressure 162/82, SpO2 of 98% on room air. While in the ED, patient developed fever of 100.6. BMP: Na 138, K 3.4, chloride 100, bicarb 24, BUN 11,  serum creatinine 0.88, EGFR >  60, nonfasting blood glucose 220, WBC 12.8, hemoglobin 15.2, platelets of 152.   COVID/influenza A/influenza B/RSV PCR were negative.   Lactic acid was 1.7 and on repeat is 1.5.  Blood cultures x 2 have been collected and are in process.   CT abdomen pelvis w cm: Was read as multiple fluid-filled loops of small and large bowel with a few mildly dilated loops of small bowel in the central abdomen.  Findings are nonspecific but could reflect enteritis or ileus.  No evidence of high-grade bowel obstruction or perforation. New indeterminate, partially solid, noncalcified nodule in the midline omentum, possibly necrotic lymph node.  This could be atypical or metastatic head/neck cancer.  Consider further CT PET/CT or abdominal CT in 3 to 6 months.   Cholelithiasis without evidence of cholecystitis or biliary ductal dilatation.   CT head wo contrast: Read as no acute or traumatic finding, generalized atrophy.  Metallic density foreign object in the region of the right upper orbit on the left.   Assessment and Plan:   # Norovirus gastroenteritis.   Diarrhea resolved, no diarrhea since 1/8, Continue oral hydration  # Bilateral lower extremity weakness, most likely due to dehydration and electrolyte imbalance secondary to norovirus infection. Seen by neurology, less likely Guillain-Barr syndrome, no further workup needed Ct Head: 1. No acute or traumatic finding. Generalized atrophy. 2. 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 Metallic density foreign  object in the region of the upper orbit on the left. MRI cannot be ordered due to metallic foreign body  PT/OT eval done, recommend SNF placement Continue to monitor on telemetry Continue fall precautions TOC following for disposition plan   # Hypokalemia, potassium repleted.  # Hypophosphatemia, Phos repleted. # Hypomagnesemia, mag repleted. Monitor electrolytes.  # Essential  hypertension Home metoprolol  tartrate 25 mg p.o. twice daily resumed Monitor BP and titrate medications accordingly   # Diabetes mellitus type 2, noninsulin dependent Discontinued Home glimepiride on dc. S/p Insulin  SSI  HbA1c and CBG stable, continue diabetic diet. # Omental mass Per CT read: New indeterminate, partially solid noncalcified nodule in the midline omentum, possibly a necrotic lymph node. Recommend outpatient follow-up PET/CT or abdominal CT in 3 to 6 months outpatient # GERD: PPI  # Coronary atherosclerosis: Patient reports he is no longer taking aspirin. Home simvastatin  20 mg every evening resumed # Acute delirium noticed on 1/9, patient pulled out IV line.  No agitation. Started Seroquel  25 mg p.o. twice daily 1/10 decreased Seroquel  25 mg po every evening    Body mass index is 26.21 kg/m.  Nutrition Interventions:  - Patient was instructed, not to drive, operate heavy machinery, perform activities at heights, swimming or participation in water activities or provide baby sitting services while on Pain, Sleep and Anxiety Medications; until his outpatient Physician has advised to do so again.  - Also recommended to not to take more than prescribed Pain, Sleep and Anxiety Medications.  Patient was seen by physical therapy, who recommended Therapy, SNF placement, which was arranged. On the day of the discharge the patient's vitals were stable, and no other acute medical condition were reported by patient. the patient was felt safe to be discharge at [facility].   Consultants: None Procedures: None  Discharge Exam: General: Appear in no distress, no Rash; Oral Mucosa Clear, moist. Cardiovascular: S1 and S2 Present, no Murmur, Respiratory: normal respiratory effort, Bilateral Air entry present and no Crackles, no wheezes Abdomen: Bowel Sound present, Soft and no tenderness, no hernia Extremities: no Pedal edema, no calf tenderness Neurology: alert and oriented to time, place,  and person affect appropriate.  Filed Weights   04/30/23 1500  Weight: 80.5 kg   Vitals:   05/03/23 0456 05/03/23 0831  BP: (!) 140/69 (!) 150/81  Pulse: 64 72  Resp: 18 18  Temp: 97.7 F (36.5 C) 98 F (36.7 C)  SpO2: 93% 94%    DISCHARGE MEDICATION: Allergies as of 05/03/2023   No Known Allergies      Medication List     STOP taking these medications    aspirin 81 MG tablet   glimepiride 2 MG tablet Commonly known as: AMARYL       TAKE these medications    acetaminophen  500 MG tablet Commonly known as: TYLENOL  Take 500-1,000 mg by mouth every 8 (eight) hours as needed for moderate pain.   fluticasone  50 MCG/ACT nasal spray Commonly known as: FLONASE  Place 1 spray into the nose daily as needed for allergies or rhinitis.   metoprolol  tartrate 25 MG tablet Commonly known as: LOPRESSOR  Take 25 mg by mouth 2 (two) times daily.   omeprazole 40 MG capsule Commonly known as: PRILOSEC Take 40 mg by mouth at bedtime.   QUEtiapine  25 MG tablet Commonly known as: SEROQUEL  Take 1 tablet (25 mg total) by mouth every evening.   simvastatin  20 MG tablet Commonly known as: ZOCOR  Take 20 mg by mouth every evening.  Systane 0.4-0.3 % Soln Generic drug: Polyethyl Glycol-Propyl Glycol Apply 1 drop to eye daily.   triamcinolone cream 0.5 % Commonly known as: KENALOG Apply 1 application topically 2 (two) times daily as needed (cancerous spots).       No Known Allergies Discharge Instructions     Call MD for:  difficulty breathing, headache or visual disturbances   Complete by: As directed    Call MD for:  extreme fatigue   Complete by: As directed    Call MD for:  persistant dizziness or light-headedness   Complete by: As directed    Call MD for:  persistant nausea and vomiting   Complete by: As directed    Call MD for:  severe uncontrolled pain   Complete by: As directed    Call MD for:  temperature >100.4   Complete by: As directed    Diet - low  sodium heart healthy   Complete by: As directed    Discharge instructions   Complete by: As directed    Follow-up with PCP, patient is to be seen by an MD in 1 to 2 days, repeat BMP in 1 week to check electrolytes.   Increase activity slowly   Complete by: As directed        The results of significant diagnostics from this hospitalization (including imaging, microbiology, ancillary and laboratory) are listed below for reference.    Significant Diagnostic Studies: CT HEAD WO CONTRAST ( ) Result Date: 04/29/2023 CLINICAL DATA:  Syncope/presyncope.  Multiple falls. EXAM: CT HEAD WITHOUT CONTRAST TECHNIQUE: Contiguous axial images were obtained from the base of the skull through the vertex without intravenous contrast. RADIATION DOSE REDUCTION: This exam was performed according to the departmental dose-optimization program which includes automated exposure control, adjustment of the mA and/or kV according to patient size and/or use of iterative reconstruction technique. COMPARISON:  07/13/2011 FINDINGS: Brain: Generalized atrophy. No evidence of focal infarction, mass lesion, hemorrhage, hydrocephalus or extra-axial collection. Vascular: No abnormal vascular finding. Skull: Extensive resection of the temporal bone, skull base and mandible on the left. Sinuses/Orbits: Clear sinuses. Metallic density foreign object in the region of the upper orbit on the left. Other: None IMPRESSION: 1. No acute or traumatic finding. Generalized atrophy. 2. 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 Metallic density foreign object in the region of the upper orbit on the left. Electronically Signed   By: Oneil Officer M.D.   On: 04/29/2023 13:55   CT ABDOMEN PELVIS W CONTRAST Result Date: 04/29/2023 CLINICAL DATA:  Sepsis, multiple falls, weakness. History of head and neck cancer. EXAM: CT ABDOMEN AND PELVIS WITH CONTRAST TECHNIQUE: Multidetector CT imaging of the abdomen and pelvis was  performed using the standard protocol following bolus administration of intravenous contrast. RADIATION DOSE REDUCTION: This exam was performed according to the departmental dose-optimization program which includes automated exposure control, adjustment of the mA and/or kV according to patient size and/or use of iterative reconstruction technique. CONTRAST:  OMNIPAQUE  IOHEXOL  300 MG/ML  SOLN COMPARISON:  PET-CT 09/22/2010.  Lumbar MRI 02/28/2023. FINDINGS: Lower chest: Clear lung bases. No significant pleural or pericardial effusion. Aortic and coronary artery atherosclerosis with a moderate size hiatal hernia. Hepatobiliary: The liver is normal in density without suspicious focal abnormality. Single calcified gallstone. No evidence of gallbladder wall thickening, surrounding inflammation or biliary ductal dilatation. Pancreas: Unremarkable. No pancreatic ductal dilatation or surrounding inflammatory changes. Spleen: Normal in size without focal abnormality. Adrenals/Urinary Tract: Both adrenal glands appear  normal. No evidence of urinary tract calculus, suspicious renal lesion or hydronephrosis. The bladder appears unremarkable for its degree of distention. Stomach/Bowel: No enteric contrast administered. As above, moderate-size hiatal hernia. The stomach otherwise appears unremarkable for its degree of distention. There are multiple fluid-filled loops of small and large bowel. There are few mildly dilated loops of small bowel in the central abdomen, without focal transition point, wall thickening or surrounding inflammation. The appendix appears normal. Vascular/Lymphatic: There is a new partially solid, noncalcified nodule in the midline omentum measuring 2.1 x 1.6 cm on image 49/2, possibly a necrotic lymph node. No other enlarged abdominopelvic lymph nodes are identified. Aortic and branch vessel atherosclerosis without evidence of aneurysm or large vessel occlusion. Reproductive: Mild enlargement of the  prostate gland with mild central heterogeneity. Other: Small umbilical hernia containing only fat. Asymmetric fat in the right inguinal canal. No ascites or generalized peritoneal nodularity. Musculoskeletal: No acute or significant osseous findings. Mild spondylosis. IMPRESSION: 1. Multiple fluid-filled loops of small and large bowel with a few mildly dilated loops of small bowel in the central abdomen. Findings are nonspecific but could reflect enteritis or ileus. No evidence of high-grade bowel obstruction or perforation. 2. New indeterminate, partially solid, noncalcified nodule in the midline omentum, possibly a necrotic lymph node. This would be atypical for metastatic head and neck cancer. Consider further evaluation with PET-CT or abdominal CT follow-up in 3-6 months. 3. Moderate-size hiatal hernia. 4. Cholelithiasis without evidence of cholecystitis or biliary ductal dilatation. 5.  Aortic Atherosclerosis (ICD10-I70.0). Electronically Signed   By: Elsie Perone M.D.   On: 04/29/2023 11:04   DG Chest Port 1 View Result Date: 04/29/2023 CLINICAL DATA:  Multiple falls.  Vomiting. EXAM: PORTABLE CHEST 1 VIEW COMPARISON:  11/21/2006 FINDINGS: The lungs are clear without focal pneumonia, edema, pneumothorax or pleural effusion. Streaky opacity in the retrocardiac left base suggests atelectasis. Cardiopericardial silhouette is at upper limits of normal for size. No acute bony abnormality. Degenerative changes noted in both shoulders. IMPRESSION: Streaky opacity in the retrocardiac left base suggests atelectasis. Otherwise no acute findings. Electronically Signed   By: Camellia Candle M.D.   On: 04/29/2023 07:56    Microbiology: Recent Results (from the past 240 hours)  Blood Culture (routine x 2)     Status: None (Preliminary result)   Collection Time: 04/29/23  7:52 AM   Specimen: BLOOD  Result Value Ref Range Status   Specimen Description BLOOD RIGHT ANTECUBITAL  Final   Special Requests   Final     BOTTLES DRAWN AEROBIC AND ANAEROBIC Blood Culture adequate volume   Culture   Final    NO GROWTH 4 DAYS Performed at [facility], [address], [place], [place] [address]    Report Status PENDING  Incomplete  Blood Culture (routine x 2)     Status: None (Preliminary result)   Collection Time: 04/29/23  7:52 AM   Specimen: BLOOD  Result Value Ref Range Status   Specimen Description BLOOD LEFT ANTECUBITAL  Final   Special Requests   Final    BOTTLES DRAWN AEROBIC AND ANAEROBIC Blood Culture adequate volume   Culture   Final    NO GROWTH 4 DAYS Performed at [facility], [address] [place], [place] [address]    Report Status PENDING  Incomplete  Resp panel by RT-PCR (RSV, Flu A&B, Covid) Anterior Nasal Swab     Status: None   Collection Time: 04/29/23  7:52 AM   Specimen: Anterior Nasal  Swab  Result Value Ref Range Status   SARS Coronavirus 2 by RT PCR NEGATIVE NEGATIVE Final    Comment: (NOTE) SARS-CoV-2 target nucleic acids are NOT DETECTED.  The SARS-CoV-2 RNA is generally detectable in upper respiratory specimens during the acute phase of infection. The lowest concentration of SARS-CoV-2 viral copies this assay can detect is 138 copies/mL. A negative result does not preclude SARS-Cov-2 infection and should not be used as the sole basis for treatment or other patient management decisions. A negative result may occur with  improper specimen collection/handling, submission of specimen other than nasopharyngeal swab, presence of viral mutation(s) within the areas targeted by this assay, and inadequate number of viral copies(<138 copies/mL). A negative result must be combined with clinical observations, patient history, and epidemiological information. The expected result is Negative.  Fact Sheet for Patients:  bloggercourse.com  Fact Sheet for Healthcare Providers:  seriousbroker.it  This test is no  t yet approved or cleared by the [place]  FDA and  has been authorized for detection and/or diagnosis of SARS-CoV-2 by FDA under an Emergency Use Authorization (EUA). This EUA will remain  in effect (meaning this test can be used) for the duration of the COVID-19 declaration under Section 564(b)(1) of the Act, 21 U.S.C.section 360bbb-3(b)(1), unless the authorization is terminated  or revoked sooner.       Influenza A by PCR NEGATIVE NEGATIVE Final   Influenza B by PCR NEGATIVE NEGATIVE Final    Comment: (NOTE) The Xpert Xpress SARS-CoV-2/FLU/RSV plus assay is intended as an aid in the diagnosis of influenza from Nasopharyngeal swab specimens and should not be used as a sole basis for treatment. Nasal washings and aspirates are unacceptable for Xpert Xpress SARS-CoV-2/FLU/RSV testing.  Fact Sheet for Patients: bloggercourse.com  Fact Sheet for Healthcare Providers: seriousbroker.it  This test is not yet approved or cleared by the [place]  FDA and has been authorized for detection and/or diagnosis of SARS-CoV-2 by FDA under an Emergency Use Authorization (EUA). This EUA will remain in effect (meaning this test can be used) for the duration of the COVID-19 declaration under Section 564(b)(1) of the Act, 21 U.S.C. section 360bbb-3(b)(1), unless the authorization is terminated or revoked.     Resp Syncytial Virus by PCR NEGATIVE NEGATIVE Final    Comment: (NOTE) Fact Sheet for Patients: bloggercourse.com  Fact Sheet for Healthcare Providers: seriousbroker.it  This test is not yet approved or cleared by the [place]  FDA and has been authorized for detection and/or diagnosis of SARS-CoV-2 by FDA under an Emergency Use Authorization (EUA). This EUA will remain in effect (meaning this test can be used) for the duration of the COVID-19 declaration under Section  564(b)(1) of the Act, 21 U.S.C. section 360bbb-3(b)(1), unless the authorization is terminated or revoked.  Performed at [facility], [address] [place], [place] [address]   Gastrointestinal Panel by PCR , Stool     Status: Abnormal   Collection Time: 04/29/23  9:37 PM   Specimen: STOOL  Result Value Ref Range Status   Campylobacter species NOT DETECTED NOT DETECTED Final   Plesimonas shigelloides NOT DETECTED NOT DETECTED Final   Salmonella species NOT DETECTED NOT DETECTED Final   Yersinia enterocolitica NOT DETECTED NOT DETECTED Final   Vibrio species NOT DETECTED NOT DETECTED Final   Vibrio cholerae NOT DETECTED NOT DETECTED Final   Enteroaggregative E coli (EAEC) NOT DETECTED NOT DETECTED Final   Enteropathogenic E coli (EPEC) NOT DETECTED NOT DETECTED Final   Enterotoxigenic E  coli (ETEC) NOT DETECTED NOT DETECTED Final   Shiga like toxin producing E coli (STEC) NOT DETECTED NOT DETECTED Final   Shigella/Enteroinvasive E coli (EIEC) NOT DETECTED NOT DETECTED Final   Cryptosporidium NOT DETECTED NOT DETECTED Final   Cyclospora cayetanensis NOT DETECTED NOT DETECTED Final   Entamoeba histolytica NOT DETECTED NOT DETECTED Final   Giardia lamblia NOT DETECTED NOT DETECTED Final   Adenovirus F40/41 NOT DETECTED NOT DETECTED Final   Astrovirus NOT DETECTED NOT DETECTED Final   Norovirus GI/GII DETECTED (A) NOT DETECTED Final    Comment: RESULT CALLED TO, READ BACK BY AND VERIFIED WITH:  JENNIFER ZUREK AT 2350 04/29/23 JG    Rotavirus A NOT DETECTED NOT DETECTED Final   Sapovirus (I, II, IV, and V) NOT DETECTED NOT DETECTED Final    Comment: Performed at [facility], [address] [place], [place] [address]  C Difficile Quick Screen w PCR reflex     Status: None   Collection Time: 04/29/23  9:37 PM   Specimen: STOOL  Result Value Ref Range Status   C Diff antigen NEGATIVE NEGATIVE Final   C Diff toxin NEGATIVE NEGATIVE Final   C Diff interpretation  No C. difficile detected.  Final    Comment: Performed at [facility], [address] [place], [place] [address]     Labs: CBC: Recent Labs  Lab 04/29/23 0543 04/30/23 1454 05/01/23 0457 05/02/23 0439 05/03/23 0548  WBC 12.8* 7.7 5.4 4.9 3.8*  NEUTROABS 11.8*  --   --   --   --   HGB 15.2 13.0 13.1 13.1 12.8*  HCT 45.0 38.3* 38.2* 37.6* 37.4*  MCV 95.9 93.2 94.3 91.3 93.3  PLT 159 137* 128* 147* 154   Basic Metabolic Panel: Recent Labs  Lab 04/29/23 0543 04/29/23 1706 04/30/23 1454 05/01/23 0457 05/02/23 0439 05/03/23 0548  NA 138  --  136 138 138 141  K 3.4*  --  3.3* 3.4* 3.7 3.2*  CL 100  --  101 101 100 104  CO2 25  --  25 27 27 27   GLUCOSE 220*  --  129* 97 121* 85  BUN 11  --  17 14 10  7*  CREATININE 0.88  --  0.72 0.63 0.71 0.66  CALCIUM 9.2  --  8.5* 8.7* 8.4* 8.6*  MG  --  1.6* 2.1 2.1 2.0 1.9  PHOS  --   --  2.9 2.4* 2.3* 3.2   Liver Function Tests: Recent Labs  Lab 04/29/23 0543  AST 22  ALT 15  ALKPHOS 59  BILITOT 1.5*  PROT 7.3  ALBUMIN 4.0   No results for input(s): LIPASE, AMYLASE in the last 168 hours. No results for input(s): AMMONIA in the last 168 hours. Cardiac Enzymes: No results for input(s): CKTOTAL, CKMB, CKMBINDEX, TROPONINI in the last 168 hours. BNP (last 3 results) No results for input(s): BNP in the last 8760 hours. CBG: Recent Labs  Lab 05/02/23 1159 05/02/23 1718 05/02/23 2128 05/03/23 0828 05/03/23 1149  GLUCAP 103* 86 92 122* 107*    Time spent: 35 minutes  Signed:  Elvan Sor  [facility] 05/03/2023 2:58 PM

## 2023-05-03 NOTE — Plan of Care (Signed)

## 2023-05-03 NOTE — TOC Progression Note (Signed)
 Transition of Care [facility]) - Progression Note    Patient Details  Name: Brandon Clements MRN: [id number] Date of Birth: 10/01/1940  [facility] CM/SW Contact  Ladene Lady, LCSW Phone Number: 05/03/2023, 1:41 PM  Clinical Narrative:   Shara approved [phone].     Expected Discharge Plan: Skilled Nursing Facility Barriers to Discharge: Continued Medical Work up  Expected Discharge Plan and Services     Post Acute Care Choice: Skilled Nursing Facility Living arrangements for the past 2 months: Single Family Home                                       Social Determinants of Health (SDOH) Interventions SDOH Screenings   Food Insecurity: Food Insecurity Present (04/30/2023)  Housing: Low Risk  (04/30/2023)  Transportation Needs: No Transportation Needs (04/30/2023)  Utilities: Not At Risk (04/30/2023)  Social Connections: Moderately Isolated (04/30/2023)  Tobacco Use: High Risk (04/29/2023)    Readmission Risk Interventions     No data to display

## 2023-05-04 DIAGNOSIS — I10 Essential (primary) hypertension: Secondary | ICD-10-CM

## 2023-05-04 LAB — GLUCOSE, CAPILLARY
Glucose-Capillary: 93 mg/dL (ref 70–99)
Glucose-Capillary: 126 mg/dL — ABNORMAL HIGH (ref 70–99)
Glucose-Capillary: 119 mg/dL — ABNORMAL HIGH (ref 70–99)
Glucose-Capillary: 111 mg/dL — ABNORMAL HIGH (ref 70–99)

## 2023-05-04 LAB — BASIC METABOLIC PANEL
Anion gap: 10 (ref 5–15)
BUN: 8 mg/dL (ref 8–23)
CO2: 25 mmol/L (ref 22–32)
Calcium: 8.7 mg/dL — ABNORMAL LOW (ref 8.9–10.3)
Chloride: 104 mmol/L (ref 98–111)
Creatinine, Ser: 0.67 mg/dL (ref 0.61–1.24)
GFR, Estimated: >60 mL/min (ref >60)
Glucose, Bld: 88 mg/dL (ref 70–99)
Potassium: 3.7 mmol/L (ref 3.5–5.1)
Sodium: 139 mmol/L (ref 135–145)

## 2023-05-04 LAB — CBC
HCT: 36.9 % — ABNORMAL LOW (ref 39.0–52.0)
Hemoglobin: 12.7 g/dL — ABNORMAL LOW (ref 13.0–17.0)
MCH: 31.9 pg (ref 26.0–34.0)
MCHC: 34.4 g/dL (ref 30.0–36.0)
MCV: 92.7 fL (ref 80.0–100.0)
Platelets: 166 10*3/uL (ref 150–400)
RBC: 3.98 MIL/uL — ABNORMAL LOW (ref 4.22–5.81)
RDW: 12.5 % (ref 11.5–15.5)
WBC: 3.6 10*3/uL — ABNORMAL LOW (ref 4.0–10.5)
nRBC: 0 % (ref 0.0–0.2)

## 2023-05-04 LAB — MAGNESIUM: Magnesium: 2 mg/dL (ref 1.7–2.4)

## 2023-05-04 LAB — PHOSPHORUS: Phosphorus: 3.1 mg/dL (ref 2.5–4.6)

## 2023-05-04 LAB — CULTURE, BLOOD (ROUTINE X 2)
Culture: NO GROWTH
Culture: NO GROWTH
Special Requests: ADEQUATE
Special Requests: ADEQUATE

## 2023-05-04 NOTE — TOC Progression Note (Signed)
 Transition of Care [facility]) - Progression Note    Patient Details  Name: Brandon Clements MRN: [id number] Date of Birth: 04-20-1941  Transition of Care [facility]) CM/SW Contact  Odella Ku, RN Phone Number: 05/04/2023, 11:33 AM  Clinical Narrative:     Patient medically ready for discharge, however going to [facility].  Spoke to [facility] for non emergent transport, they are only providing transportation to [facility] today, [name] is in [place].  Advised to try back tomorrow.  Expected Discharge Plan: Skilled Nursing Facility Barriers to Discharge: Barriers Resolved  Expected Discharge Plan and Services     Post Acute Care Choice: Skilled Nursing Facility Living arrangements for the past 2 months: Single Family Home Expected Discharge Date: 05/03/23                                     Social Determinants of Health (SDOH) Interventions SDOH Screenings   Food Insecurity: Food Insecurity Present (04/30/2023)  Housing: Low Risk  (04/30/2023)  Transportation Needs: No Transportation Needs (04/30/2023)  Utilities: Not At Risk (04/30/2023)  Social Connections: Moderately Isolated (04/30/2023)  Tobacco Use: High Risk (04/29/2023)    Readmission Risk Interventions     No data to display

## 2023-05-04 NOTE — Discharge Summary (Signed)
 [facility] Discharge Summary   Patient: Brandon Clements FMW:[id number]  PCP: Auston Reyes BIRCH, MD  Date of admission: 04/29/2023   Date of discharge:  05/04/2023     Discharge Diagnoses:  Generalized weakness   Coronary atherosclerosis   Esophagitis   GERD   Benign neoplasm of transverse colon   Benign neoplasm of ascending colon   Omental mass   Diabetes mellitus type 2, noninsulin dependent (HCC)   Essential hypertension   Weakness of both lower extremities   Admitted From: Home Disposition:  SNF   Recommendations for Outpatient Follow-up:  Follow-up with PCP, patient is to be seen by an MD in 1 to 2 days, repeat BMP in 1 week to check electrolytes. Follow up LABS/TEST:  BMP, Consider further CT PET/CT or abdominal CT in 3 to 6 month  Diet recommendation: Cardiac diet  Discharge Condition: stable  Code Status: Full code   History of present illness: As per the H and P dictated on admission. Hospital Course:  [name] is a 83 year old male with history of hypertension, hyperlipidemia, GERD, non-insulin -dependent diabetes mellitus, who presents emergency department for chief concerns of weakness in the bilateral legs and multiple falls. Patient was exposed to norovirus at home by his grandson.  Patient is having diarrhea for past 3 days. Vitals in the ED showed temperature of 98.6, respiration rate of 18, heart rate 97, blood pressure 162/82, SpO2 of 98% on room air. While in the ED, patient developed fever of 100.6. BMP: Na 138, K 3.4, chloride 100, bicarb 24, BUN 11, serum creatinine 0.88, EGFR >  60, nonfasting blood glucose 220, WBC 12.8, hemoglobin 15.2, platelets of 152.   COVID/influenza A/influenza B/RSV PCR were negative.   Lactic acid was 1.7 and on repeat is 1.5.  Blood cultures x 2 have been collected and are in process.   CT abdomen pelvis w cm: Was read as multiple fluid-filled loops of small and large bowel with a few mildly dilated loops of  small bowel in the central abdomen.  Findings are nonspecific but could reflect enteritis or ileus.  No evidence of high-grade bowel obstruction or perforation. New indeterminate, partially solid, noncalcified nodule in the midline omentum, possibly necrotic lymph node.  This could be atypical or metastatic head/neck cancer.  Consider further CT PET/CT or abdominal CT in 3 to 6 months.   Cholelithiasis without evidence of cholecystitis or biliary ductal dilatation.   CT head wo contrast: Read as no acute or traumatic finding, generalized atrophy.  Metallic density foreign object in the region of the right upper orbit on the left.   Assessment and Plan:   # Norovirus gastroenteritis.   Diarrhea resolved, no diarrhea since 1/8.  # Bilateral lower extremity weakness, most likely due to dehydration and electrolyte imbalance secondary to norovirus infection. Seen by neurology, less likely Guillain-Barr syndrome, no further workup needed Ct Head: 1. No acute or traumatic finding. Generalized atrophy. 2. 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 Metallic density foreign object in the region of the upper orbit on the left. MRI cannot be ordered due to metallic foreign body  PT/OT eval done, recommend SNF placement.    # Hypokalemia, potassium repleted.  # Hypophosphatemia, Phos repleted. # Hypomagnesemia, mag repleted.  # Essential hypertension Continue metoprolol    # Diabetes mellitus type 2, noninsulin dependent Discontinued Home glimepiride on dc.  HbA1c 5.9.  CBGs can be monitored periodically.  # Omental mass Per CT read:  New indeterminate, partially solid noncalcified nodule in the midline omentum, possibly a necrotic lymph node. Recommend outpatient follow-up PET/CT or abdominal CT in 3 to 6 months outpatient  # GERD: PPI   # Coronary atherosclerosis: Patient reports he is no longer taking aspirin. Home simvastatin  20 mg every evening resumed  #  Acute delirium noticed on 1/9, patient pulled out IV line.  Started on Seroquel  with resolution.   - Patient was instructed, not to drive, operate heavy machinery, perform activities at heights, swimming or participation in water activities or provide baby sitting services while on Pain, Sleep and Anxiety Medications; until his outpatient Physician has advised to do so again.  - Also recommended to not to take more than prescribed Pain, Sleep and Anxiety Medications.  Patient was seen by physical therapy, who recommended Therapy, SNF placement, which was arranged.  Patient is stable.  Okay for discharge to SNF when transportation is available.  Consultants: None Procedures: None  Discharge Exam:  General appearance: Awake alert.  In no distress Resp: Clear to auscultation bilaterally.  Normal effort Cardio: S1-S2 is normal regular.  No S3-S4.  No rubs murmurs or bruit GI: Abdomen is soft.  Nontender nondistended.  Bowel sounds are present normal.  No masses organomegaly   Filed Weights   04/30/23 1500  Weight: 80.5 kg   Vitals:   05/04/23 0448 05/04/23 0831  BP: 131/72 131/87  Pulse: 63 72  Resp: 18 16  Temp: 97.7 F (36.5 C) 97.8 F (36.6 C)  SpO2: 94% 94%    DISCHARGE MEDICATION: Allergies as of 05/04/2023   No Known Allergies      Medication List     STOP taking these medications    aspirin 81 MG tablet   glimepiride 2 MG tablet Commonly known as: AMARYL       TAKE these medications    acetaminophen  500 MG tablet Commonly known as: TYLENOL  Take 500-1,000 mg by mouth every 8 (eight) hours as needed for moderate pain.   fluticasone  50 MCG/ACT nasal spray Commonly known as: FLONASE  Place 1 spray into the nose daily as needed for allergies or rhinitis.   metoprolol  tartrate 25 MG tablet Commonly known as: LOPRESSOR  Take 25 mg by mouth 2 (two) times daily.   omeprazole 40 MG capsule Commonly known as: PRILOSEC Take 40 mg by mouth at bedtime.    QUEtiapine  25 MG tablet Commonly known as: SEROQUEL  Take 1 tablet (25 mg total) by mouth every evening.   simvastatin  20 MG tablet Commonly known as: ZOCOR  Take 20 mg by mouth every evening.   Systane 0.4-0.3 % Soln Generic drug: Polyethyl Glycol-Propyl Glycol Apply 1 drop to eye daily.   triamcinolone cream 0.5 % Commonly known as: KENALOG Apply 1 application topically 2 (two) times daily as needed (cancerous spots).       No Known Allergies Discharge Instructions     Call MD for:  difficulty breathing, headache or visual disturbances   Complete by: As directed    Call MD for:  extreme fatigue   Complete by: As directed    Call MD for:  persistant dizziness or light-headedness   Complete by: As directed    Call MD for:  persistant nausea and vomiting   Complete by: As directed    Call MD for:  severe uncontrolled pain   Complete by: As directed    Call MD for:  temperature >100.4   Complete by: As directed    Diet - low sodium heart  healthy   Complete by: As directed    Discharge instructions   Complete by: As directed    Follow-up with PCP, patient is to be seen by an MD in 1 to 2 days, repeat BMP in 1 week to check electrolytes.   Increase activity slowly   Complete by: As directed        The results of significant diagnostics from this hospitalization (including imaging, microbiology, ancillary and laboratory) are listed below for reference.    Significant Diagnostic Studies: CT HEAD WO CONTRAST ( ) Result Date: 04/29/2023 CLINICAL DATA:  Syncope/presyncope.  Multiple falls. EXAM: CT HEAD WITHOUT CONTRAST TECHNIQUE: Contiguous axial images were obtained from the base of the skull through the vertex without intravenous contrast. RADIATION DOSE REDUCTION: This exam was performed according to the departmental dose-optimization program which includes automated exposure control, adjustment of the mA and/or kV according to patient size and/or use of iterative  reconstruction technique. COMPARISON:  07/13/2011 FINDINGS: Brain: Generalized atrophy. No evidence of focal infarction, mass lesion, hemorrhage, hydrocephalus or extra-axial collection. Vascular: No abnormal vascular finding. Skull: Extensive resection of the temporal bone, skull base and mandible on the left. Sinuses/Orbits: Clear sinuses. Metallic density foreign object in the region of the upper orbit on the left. Other: None IMPRESSION: 1. No acute or traumatic finding. Generalized atrophy. 2. 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 Metallic density foreign object in the region of the upper orbit on the left. Electronically Signed   By: Oneil Officer M.D.   On: 04/29/2023 13:55   CT ABDOMEN PELVIS W CONTRAST Result Date: 04/29/2023 CLINICAL DATA:  Sepsis, multiple falls, weakness. History of head and neck cancer. EXAM: CT ABDOMEN AND PELVIS WITH CONTRAST TECHNIQUE: Multidetector CT imaging of the abdomen and pelvis was performed using the standard protocol following bolus administration of intravenous contrast. RADIATION DOSE REDUCTION: This exam was performed according to the departmental dose-optimization program which includes automated exposure control, adjustment of the mA and/or kV according to patient size and/or use of iterative reconstruction technique. CONTRAST:  OMNIPAQUE  IOHEXOL  300 MG/ML  SOLN COMPARISON:  PET-CT 09/22/2010.  Lumbar MRI 02/28/2023. FINDINGS: Lower chest: Clear lung bases. No significant pleural or pericardial effusion. Aortic and coronary artery atherosclerosis with a moderate size hiatal hernia. Hepatobiliary: The liver is normal in density without suspicious focal abnormality. Single calcified gallstone. No evidence of gallbladder wall thickening, surrounding inflammation or biliary ductal dilatation. Pancreas: Unremarkable. No pancreatic ductal dilatation or surrounding inflammatory changes. Spleen: Normal in size without focal  abnormality. Adrenals/Urinary Tract: Both adrenal glands appear normal. No evidence of urinary tract calculus, suspicious renal lesion or hydronephrosis. The bladder appears unremarkable for its degree of distention. Stomach/Bowel: No enteric contrast administered. As above, moderate-size hiatal hernia. The stomach otherwise appears unremarkable for its degree of distention. There are multiple fluid-filled loops of small and large bowel. There are few mildly dilated loops of small bowel in the central abdomen, without focal transition point, wall thickening or surrounding inflammation. The appendix appears normal. Vascular/Lymphatic: There is a new partially solid, noncalcified nodule in the midline omentum measuring 2.1 x 1.6 cm on image 49/2, possibly a necrotic lymph node. No other enlarged abdominopelvic lymph nodes are identified. Aortic and branch vessel atherosclerosis without evidence of aneurysm or large vessel occlusion. Reproductive: Mild enlargement of the prostate gland with mild central heterogeneity. Other: Small umbilical hernia containing only fat. Asymmetric fat in the right inguinal canal. No ascites or generalized peritoneal nodularity. Musculoskeletal:  No acute or significant osseous findings. Mild spondylosis. IMPRESSION: 1. Multiple fluid-filled loops of small and large bowel with a few mildly dilated loops of small bowel in the central abdomen. Findings are nonspecific but could reflect enteritis or ileus. No evidence of high-grade bowel obstruction or perforation. 2. New indeterminate, partially solid, noncalcified nodule in the midline omentum, possibly a necrotic lymph node. This would be atypical for metastatic head and neck cancer. Consider further evaluation with PET-CT or abdominal CT follow-up in 3-6 months. 3. Moderate-size hiatal hernia. 4. Cholelithiasis without evidence of cholecystitis or biliary ductal dilatation. 5.  Aortic Atherosclerosis (ICD10-I70.0). Electronically Signed    By: Elsie Perone M.D.   On: 04/29/2023 11:04   DG Chest Port 1 View Result Date: 04/29/2023 CLINICAL DATA:  Multiple falls.  Vomiting. EXAM: PORTABLE CHEST 1 VIEW COMPARISON:  11/21/2006 FINDINGS: The lungs are clear without focal pneumonia, edema, pneumothorax or pleural effusion. Streaky opacity in the retrocardiac left base suggests atelectasis. Cardiopericardial silhouette is at upper limits of normal for size. No acute bony abnormality. Degenerative changes noted in both shoulders. IMPRESSION: Streaky opacity in the retrocardiac left base suggests atelectasis. Otherwise no acute findings. Electronically Signed   By: Camellia Candle M.D.   On: 04/29/2023 07:56    Microbiology: Recent Results (from the past 240 hours)  Blood Culture (routine x 2)     Status: None   Collection Time: 04/29/23  7:52 AM   Specimen: BLOOD  Result Value Ref Range Status   Specimen Description BLOOD RIGHT ANTECUBITAL  Final   Special Requests   Final    BOTTLES DRAWN AEROBIC AND ANAEROBIC Blood Culture adequate volume   Culture   Final    NO GROWTH 5 DAYS Performed at [facility], [address] [place], [place] [address]    Report Status 05/04/2023 FINAL  Final  Blood Culture (routine x 2)     Status: None   Collection Time: 04/29/23  7:52 AM   Specimen: BLOOD  Result Value Ref Range Status   Specimen Description BLOOD LEFT ANTECUBITAL  Final   Special Requests   Final    BOTTLES DRAWN AEROBIC AND ANAEROBIC Blood Culture adequate volume   Culture   Final    NO GROWTH 5 DAYS Performed at [facility], [address] [place], [place] [address]    Report Status 05/04/2023 FINAL  Final  Resp panel by RT-PCR (RSV, Flu A&B, Covid) Anterior Nasal Swab     Status: None   Collection Time: 04/29/23  7:52 AM   Specimen: Anterior Nasal Swab  Result Value Ref Range Status   SARS Coronavirus 2 by RT PCR NEGATIVE NEGATIVE Final    Comment: (NOTE) SARS-CoV-2 target nucleic acids are NOT  DETECTED.  The SARS-CoV-2 RNA is generally detectable in upper respiratory specimens during the acute phase of infection. The lowest concentration of SARS-CoV-2 viral copies this assay can detect is 138 copies/mL. A negative result does not preclude SARS-Cov-2 infection and should not be used as the sole basis for treatment or other patient management decisions. A negative result may occur with  improper specimen collection/handling, submission of specimen other than nasopharyngeal swab, presence of viral mutation(s) within the areas targeted by this assay, and inadequate number of viral copies(<138 copies/mL). A negative result must be combined with clinical observations, patient history, and epidemiological information. The expected result is Negative.  Fact Sheet for Patients:  bloggercourse.com  Fact Sheet for Healthcare Providers:  seriousbroker.it  This test is  no t yet approved or cleared by the [place]  FDA and  has been authorized for detection and/or diagnosis of SARS-CoV-2 by FDA under an Emergency Use Authorization (EUA). This EUA will remain  in effect (meaning this test can be used) for the duration of the COVID-19 declaration under Section 564(b)(1) of the Act, 21 U.S.C.section 360bbb-3(b)(1), unless the authorization is terminated  or revoked sooner.       Influenza A by PCR NEGATIVE NEGATIVE Final   Influenza B by PCR NEGATIVE NEGATIVE Final    Comment: (NOTE) The Xpert Xpress SARS-CoV-2/FLU/RSV plus assay is intended as an aid in the diagnosis of influenza from Nasopharyngeal swab specimens and should not be used as a sole basis for treatment. Nasal washings and aspirates are unacceptable for Xpert Xpress SARS-CoV-2/FLU/RSV testing.  Fact Sheet for Patients: bloggercourse.com  Fact Sheet for Healthcare Providers: seriousbroker.it  This test is not yet  approved or cleared by the [place]  FDA and has been authorized for detection and/or diagnosis of SARS-CoV-2 by FDA under an Emergency Use Authorization (EUA). This EUA will remain in effect (meaning this test can be used) for the duration of the COVID-19 declaration under Section 564(b)(1) of the Act, 21 U.S.C. section 360bbb-3(b)(1), unless the authorization is terminated or revoked.     Resp Syncytial Virus by PCR NEGATIVE NEGATIVE Final    Comment: (NOTE) Fact Sheet for Patients: bloggercourse.com  Fact Sheet for Healthcare Providers: seriousbroker.it  This test is not yet approved or cleared by the [place]  FDA and has been authorized for detection and/or diagnosis of SARS-CoV-2 by FDA under an Emergency Use Authorization (EUA). This EUA will remain in effect (meaning this test can be used) for the duration of the COVID-19 declaration under Section 564(b)(1) of the Act, 21 U.S.C. section 360bbb-3(b)(1), unless the authorization is terminated or revoked.  Performed at [facility], [address] [place], [place] [address]   Gastrointestinal Panel by PCR , Stool     Status: Abnormal   Collection Time: 04/29/23  9:37 PM   Specimen: STOOL  Result Value Ref Range Status   Campylobacter species NOT DETECTED NOT DETECTED Final   Plesimonas shigelloides NOT DETECTED NOT DETECTED Final   Salmonella species NOT DETECTED NOT DETECTED Final   Yersinia enterocolitica NOT DETECTED NOT DETECTED Final   Vibrio species NOT DETECTED NOT DETECTED Final   Vibrio cholerae NOT DETECTED NOT DETECTED Final   Enteroaggregative E coli (EAEC) NOT DETECTED NOT DETECTED Final   Enteropathogenic E coli (EPEC) NOT DETECTED NOT DETECTED Final   Enterotoxigenic E coli (ETEC) NOT DETECTED NOT DETECTED Final   Shiga like toxin producing E coli (STEC) NOT DETECTED NOT DETECTED Final   Shigella/Enteroinvasive E coli (EIEC) NOT DETECTED  NOT DETECTED Final   Cryptosporidium NOT DETECTED NOT DETECTED Final   Cyclospora cayetanensis NOT DETECTED NOT DETECTED Final   Entamoeba histolytica NOT DETECTED NOT DETECTED Final   Giardia lamblia NOT DETECTED NOT DETECTED Final   Adenovirus F40/41 NOT DETECTED NOT DETECTED Final   Astrovirus NOT DETECTED NOT DETECTED Final   Norovirus GI/GII DETECTED (A) NOT DETECTED Final    Comment: RESULT CALLED TO, READ BACK BY AND VERIFIED WITH:  JENNIFER ZUREK AT 2350 04/29/23 JG    Rotavirus A NOT DETECTED NOT DETECTED Final   Sapovirus (I, II, IV, and V) NOT DETECTED NOT DETECTED Final    Comment: Performed at [facility], [address], [place], [place] [address]  C Difficile Quick Screen w  PCR reflex     Status: None   Collection Time: 04/29/23  9:37 PM   Specimen: STOOL  Result Value Ref Range Status   C Diff antigen NEGATIVE NEGATIVE Final   C Diff toxin NEGATIVE NEGATIVE Final   C Diff interpretation No C. difficile detected.  Final    Comment: Performed at [facility], [address] [place], [place] [address]     Labs: CBC: Recent Labs  Lab 04/29/23 0543 04/30/23 1454 05/01/23 0457 05/02/23 0439 05/03/23 0548 05/04/23 0538  WBC 12.8* 7.7 5.4 4.9 3.8* 3.6*  NEUTROABS 11.8*  --   --   --   --   --   HGB 15.2 13.0 13.1 13.1 12.8* 12.7*  HCT 45.0 38.3* 38.2* 37.6* 37.4* 36.9*  MCV 95.9 93.2 94.3 91.3 93.3 92.7  PLT 159 137* 128* 147* 154 166   Basic Metabolic Panel: Recent Labs  Lab 04/30/23 1454 05/01/23 0457 05/02/23 0439 05/03/23 0548 05/04/23 0538  NA 136 138 138 141 139  K 3.3* 3.4* 3.7 3.2* 3.7  CL 101 101 100 104 104  CO2 25 27 27 27 25   GLUCOSE 129* 97 121* 85 88  BUN 17 14 10  7* 8  CREATININE 0.72 0.63 0.71 0.66 0.67  CALCIUM 8.5* 8.7* 8.4* 8.6* 8.7*  MG 2.1 2.1 2.0 1.9 2.0  PHOS 2.9 2.4* 2.3* 3.2 3.1   Liver Function Tests: Recent Labs  Lab 04/29/23 0543  AST 22  ALT 15  ALKPHOS 59  BILITOT 1.5*  PROT 7.3  ALBUMIN 4.0     CBG: Recent Labs  Lab 05/03/23 0828 05/03/23 1149 05/03/23 1647 05/03/23 2014 05/04/23 0833  GLUCAP 122* 107* 139* 103* 93    Time spent: 35 minutes  Signed:  Joette Pebbles  [facility] 05/04/2023 11:12 AM

## 2023-05-04 NOTE — Plan of Care (Signed)

## 2023-05-05 DIAGNOSIS — M6281 Muscle weakness (generalized): Secondary | ICD-10-CM | POA: Diagnosis not present

## 2023-05-05 DIAGNOSIS — R2681 Unsteadiness on feet: Secondary | ICD-10-CM | POA: Diagnosis not present

## 2023-05-05 LAB — GLUCOSE, CAPILLARY
Glucose-Capillary: 96 mg/dL (ref 70–99)
Glucose-Capillary: 130 mg/dL — ABNORMAL HIGH (ref 70–99)
Glucose-Capillary: 135 mg/dL — ABNORMAL HIGH (ref 70–99)
Glucose-Capillary: 96 mg/dL (ref 70–99)

## 2023-05-05 MED ORDER — VITAMIN B-12 1000 MCG PO TABS
1000.0000 ug | ORAL_TABLET | Freq: Every day | ORAL | Status: DC
  Administered 2023-05-05 – 2023-05-06 (×2): 1000 ug via ORAL
  Filled 2023-05-05 (×2): qty 1

## 2023-05-05 MED ORDER — CYANOCOBALAMIN 1000 MCG PO TABS: 1000.0000 ug | ORAL_TABLET | Freq: Every day | ORAL | Status: AC

## 2023-05-05 NOTE — TOC Progression Note (Signed)
 Transition of Care [facility]) - Progression Note    Patient Details  Name: Brandon Clements MRN: [id number] Date of Birth: [birth year]  Transition of Care [facility]) CM/SW Contact  Brandon Ku, RN Phone Number: 05/05/2023, 12:19 PM  Clinical Narrative:     Brandon Clements, they remain unable to provide transport to [facility] again today as [place].  Wife has offered to provide transportation to SNF, however this RNCM is unable to confirm [name] can accept patient today.  Calls placed to [place], admissions coordinator, and directly to facility twice each, no answer.  Will continue to try to confirm [name] can accept today.  If not, will plan for discharge tomorrow.    Expected Discharge Plan: Skilled Nursing Facility Barriers to Discharge: Barriers Resolved  Expected Discharge Plan and Services     Post Acute Care Choice: Skilled Nursing Facility Living arrangements for the past 2 months: Single Family Home Expected Discharge Date: 05/05/23                                     Social Determinants of Health (SDOH) Interventions SDOH Screenings   Food Insecurity: Food Insecurity Present (04/30/2023)  Housing: Low Risk  (04/30/2023)  Transportation Needs: No Transportation Needs (04/30/2023)  Utilities: Not At Risk (04/30/2023)  Social Connections: Moderately Isolated (04/30/2023)  Tobacco Use: High Risk (04/29/2023)    Readmission Risk Interventions     No data to display

## 2023-05-05 NOTE — Plan of Care (Signed)
   Problem: Fluid Volume: Goal: Ability to maintain a balanced intake and output will improve Outcome: Progressing   Problem: Health Behavior/Discharge Planning: Goal: Ability to manage health-related needs will improve Outcome: Progressing

## 2023-05-05 NOTE — Discharge Summary (Addendum)
 [facility] Discharge Summary   Patient: Brandon Clements FMW:[id number]  PCP: Auston Reyes BIRCH, MD  Date of admission: 04/29/2023   Date of discharge:  05/06/2023     Discharge Diagnoses:  Generalized weakness   Coronary atherosclerosis   Esophagitis   GERD   Benign neoplasm of transverse colon   Benign neoplasm of ascending colon   Omental mass   Diabetes mellitus type 2, noninsulin dependent (HCC)   Essential hypertension   Weakness of both lower extremities   Admitted From: Home Disposition:  SNF   Recommendations for Outpatient Follow-up:  Follow-up with PCP, patient is to be seen by an MD in 1 to 2 days, repeat BMP in 1 week to check electrolytes. Follow up LABS/TEST:  BMP, Consider further CT PET/CT or abdominal CT in 3 to 6 month  Diet recommendation: Cardiac diet  Discharge Condition: stable  Code Status: Full code   History of present illness: As per the H and P dictated on admission. Hospital Course:  Brandon Clements is a 83 year old male with history of hypertension, hyperlipidemia, GERD, non-insulin -dependent diabetes mellitus, who presents emergency department for chief concerns of weakness in the bilateral legs and multiple falls. Patient was exposed to norovirus at home by his grandson.  Patient is having diarrhea for past 3 days. Vitals in the ED showed temperature of 98.6, respiration rate of 18, heart rate 97, blood pressure 162/82, SpO2 of 98% on room air. While in the ED, patient developed fever of 100.6. BMP: Na 138, K 3.4, chloride 100, bicarb 24, BUN 11, serum creatinine 0.88, EGFR >  60, nonfasting blood glucose 220, WBC 12.8, hemoglobin 15.2, platelets of 152.   COVID/influenza A/influenza B/RSV PCR were negative.   Lactic acid was 1.7 and on repeat is 1.5.  Blood cultures x 2 have been collected and are in process.   CT abdomen pelvis w cm: Was read as multiple fluid-filled loops of small and large bowel with a few mildly dilated loops of  small bowel in the central abdomen.  Findings are nonspecific but could reflect enteritis or ileus.  No evidence of high-grade bowel obstruction or perforation. New indeterminate, partially solid, noncalcified nodule in the midline omentum, possibly necrotic lymph node.  This could be atypical or metastatic head/neck cancer.  Consider further CT PET/CT or abdominal CT in 3 to 6 months.   Cholelithiasis without evidence of cholecystitis or biliary ductal dilatation.   CT head wo contrast: Read as no acute or traumatic finding, generalized atrophy.  Metallic density foreign object in the region of the right upper orbit on the left.   Assessment and Plan:   # Norovirus gastroenteritis.   Diarrhea resolved, no diarrhea since 1/8.  # Bilateral lower extremity weakness, most likely due to dehydration and electrolyte imbalance secondary to norovirus infection. Seen by neurology, less likely Guillain-Barr syndrome, no further workup needed Ct Head: 1. No acute or traumatic finding. Generalized atrophy. 2. 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 Metallic density foreign object in the region of the upper orbit on the left. MRI cannot be ordered due to metallic foreign body  PT/OT eval done, recommend SNF placement.    # Hypokalemia, potassium repleted.  # Hypophosphatemia, Phos repleted. # Hypomagnesemia, mag repleted.  # Essential hypertension Continue metoprolol    # Diabetes mellitus type 2, noninsulin dependent Discontinued Home glimepiride on dc.  HbA1c 5.9.  CBGs can be monitored periodically.  # Omental mass Per CT read:  New indeterminate, partially solid noncalcified nodule in the midline omentum, possibly a necrotic lymph node. Recommend outpatient follow-up PET/CT or abdominal CT in 3 to 6 months outpatient  # GERD: PPI   # Coronary atherosclerosis: Patient reports he is no longer taking aspirin. Home simvastatin  20 mg every evening resumed  #  Acute delirium noticed on 1/9, patient pulled out IV line.  Started on Seroquel  with resolution.   - Patient was instructed, not to drive, operate heavy machinery, perform activities at heights, swimming or participation in water activities or provide baby sitting services while on Pain, Sleep and Anxiety Medications; until his outpatient Physician has advised to do so again.  - Also recommended to not to take more than prescribed Pain, Sleep and Anxiety Medications.  Patient was seen by physical therapy, who recommended Therapy, SNF placement, which was arranged.  Patient remains stable.  Okay for discharge to SNF when transport is available.  Consultants: None Procedures: None  Discharge Exam: Patient is awake alert.  In no distress. Lungs are clear to auscultation. S1-S2 is normal regular. Abdomen is soft.   Filed Weights   04/30/23 1500  Weight: 80.5 kg   Vitals:   05/05/23 0434 05/05/23 0746  BP: 114/67 (!) 154/90  Pulse: (!) 59 67  Resp: 19 18  Temp: 97.8 F (36.6 C) 97.9 F (36.6 C)  SpO2: 96% 95%    DISCHARGE MEDICATION: Allergies as of 05/05/2023   No Known Allergies      Medication List     STOP taking these medications    aspirin 81 MG tablet   glimepiride 2 MG tablet Commonly known as: AMARYL       TAKE these medications    acetaminophen  500 MG tablet Commonly known as: TYLENOL  Take 500-1,000 mg by mouth every 8 (eight) hours as needed for moderate pain.   cyanocobalamin  1000 MCG tablet Take 1 tablet (1,000 mcg total) by mouth daily.   fluticasone  50 MCG/ACT nasal spray Commonly known as: FLONASE  Place 1 spray into the nose daily as needed for allergies or rhinitis.   metoprolol  tartrate 25 MG tablet Commonly known as: LOPRESSOR  Take 25 mg by mouth 2 (two) times daily.   omeprazole 40 MG capsule Commonly known as: PRILOSEC Take 40 mg by mouth at bedtime.   QUEtiapine  25 MG tablet Commonly known as: SEROQUEL  Take 1 tablet (25 mg  total) by mouth every evening.   simvastatin  20 MG tablet Commonly known as: ZOCOR  Take 20 mg by mouth every evening.   Systane 0.4-0.3 % Soln Generic drug: Polyethyl Glycol-Propyl Glycol Apply 1 drop to eye daily.   triamcinolone cream 0.5 % Commonly known as: KENALOG Apply 1 application topically 2 (two) times daily as needed (cancerous spots).       No Known Allergies Discharge Instructions     Call MD for:  difficulty breathing, headache or visual disturbances   Complete by: As directed    Call MD for:  extreme fatigue   Complete by: As directed    Call MD for:  persistant dizziness or light-headedness   Complete by: As directed    Call MD for:  persistant nausea and vomiting   Complete by: As directed    Call MD for:  severe uncontrolled pain   Complete by: As directed    Call MD for:  temperature >100.4   Complete by: As directed    Diet - low sodium heart healthy   Complete by: As directed    Discharge  instructions   Complete by: As directed    Follow-up with PCP, patient is to be seen by an MD in 1 to 2 days, repeat BMP in 1 week to check electrolytes.   Increase activity slowly   Complete by: As directed        The results of significant diagnostics from this hospitalization (including imaging, microbiology, ancillary and laboratory) are listed below for reference.    Significant Diagnostic Studies: CT HEAD WO CONTRAST ( ) Result Date: 04/29/2023 CLINICAL DATA:  Syncope/presyncope.  Multiple falls. EXAM: CT HEAD WITHOUT CONTRAST TECHNIQUE: Contiguous axial images were obtained from the base of the skull through the vertex without intravenous contrast. RADIATION DOSE REDUCTION: This exam was performed according to the departmental dose-optimization program which includes automated exposure control, adjustment of the mA and/or kV according to patient size and/or use of iterative reconstruction technique. COMPARISON:  07/13/2011 FINDINGS: Brain: Generalized  atrophy. No evidence of focal infarction, mass lesion, hemorrhage, hydrocephalus or extra-axial collection. Vascular: No abnormal vascular finding. Skull: Extensive resection of the temporal bone, skull base and mandible on the left. Sinuses/Orbits: Clear sinuses. Metallic density foreign object in the region of the upper orbit on the left. Other: None IMPRESSION: 1. No acute or traumatic finding. Generalized atrophy. 2. 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 Metallic density foreign object in the region of the upper orbit on the left. Electronically Signed   By: Oneil Officer M.D.   On: 04/29/2023 13:55   CT ABDOMEN PELVIS W CONTRAST Result Date: 04/29/2023 CLINICAL DATA:  Sepsis, multiple falls, weakness. History of head and neck cancer. EXAM: CT ABDOMEN AND PELVIS WITH CONTRAST TECHNIQUE: Multidetector CT imaging of the abdomen and pelvis was performed using the standard protocol following bolus administration of intravenous contrast. RADIATION DOSE REDUCTION: This exam was performed according to the departmental dose-optimization program which includes automated exposure control, adjustment of the mA and/or kV according to patient size and/or use of iterative reconstruction technique. CONTRAST:  OMNIPAQUE  IOHEXOL  300 MG/ML  SOLN COMPARISON:  PET-CT 09/22/2010.  Lumbar MRI 02/28/2023. FINDINGS: Lower chest: Clear lung bases. No significant pleural or pericardial effusion. Aortic and coronary artery atherosclerosis with a moderate size hiatal hernia. Hepatobiliary: The liver is normal in density without suspicious focal abnormality. Single calcified gallstone. No evidence of gallbladder wall thickening, surrounding inflammation or biliary ductal dilatation. Pancreas: Unremarkable. No pancreatic ductal dilatation or surrounding inflammatory changes. Spleen: Normal in size without focal abnormality. Adrenals/Urinary Tract: Both adrenal glands appear normal. No evidence  of urinary tract calculus, suspicious renal lesion or hydronephrosis. The bladder appears unremarkable for its degree of distention. Stomach/Bowel: No enteric contrast administered. As above, moderate-size hiatal hernia. The stomach otherwise appears unremarkable for its degree of distention. There are multiple fluid-filled loops of small and large bowel. There are few mildly dilated loops of small bowel in the central abdomen, without focal transition point, wall thickening or surrounding inflammation. The appendix appears normal. Vascular/Lymphatic: There is a new partially solid, noncalcified nodule in the midline omentum measuring 2.1 x 1.6 cm on image 49/2, possibly a necrotic lymph node. No other enlarged abdominopelvic lymph nodes are identified. Aortic and branch vessel atherosclerosis without evidence of aneurysm or large vessel occlusion. Reproductive: Mild enlargement of the prostate gland with mild central heterogeneity. Other: Small umbilical hernia containing only fat. Asymmetric fat in the right inguinal canal. No ascites or generalized peritoneal nodularity. Musculoskeletal: No acute or significant osseous findings. Mild spondylosis. IMPRESSION: 1. Multiple  fluid-filled loops of small and large bowel with a few mildly dilated loops of small bowel in the central abdomen. Findings are nonspecific but could reflect enteritis or ileus. No evidence of high-grade bowel obstruction or perforation. 2. New indeterminate, partially solid, noncalcified nodule in the midline omentum, possibly a necrotic lymph node. This would be atypical for metastatic head and neck cancer. Consider further evaluation with PET-CT or abdominal CT follow-up in 3-6 months. 3. Moderate-size hiatal hernia. 4. Cholelithiasis without evidence of cholecystitis or biliary ductal dilatation. 5.  Aortic Atherosclerosis (ICD10-I70.0). Electronically Signed   By: Elsie Perone M.D.   On: 04/29/2023 11:04   DG Chest Port 1 View Result  Date: 04/29/2023 CLINICAL DATA:  Multiple falls.  Vomiting. EXAM: PORTABLE CHEST 1 VIEW COMPARISON:  11/21/2006 FINDINGS: The lungs are clear without focal pneumonia, edema, pneumothorax or pleural effusion. Streaky opacity in the retrocardiac left base suggests atelectasis. Cardiopericardial silhouette is at upper limits of normal for size. No acute bony abnormality. Degenerative changes noted in both shoulders. IMPRESSION: Streaky opacity in the retrocardiac left base suggests atelectasis. Otherwise no acute findings. Electronically Signed   By: Camellia Candle M.D.   On: 04/29/2023 07:56    Microbiology: Recent Results (from the past 240 hours)  Blood Culture (routine x 2)     Status: None   Collection Time: 04/29/23  7:52 AM   Specimen: BLOOD  Result Value Ref Range Status   Specimen Description BLOOD RIGHT ANTECUBITAL  Final   Special Requests   Final    BOTTLES DRAWN AEROBIC AND ANAEROBIC Blood Culture adequate volume   Culture   Final    NO GROWTH 5 DAYS Performed at [facility], [address] [place], [place] [address]    Report Status 05/04/2023 FINAL  Final  Blood Culture (routine x 2)     Status: None   Collection Time: 04/29/23  7:52 AM   Specimen: BLOOD  Result Value Ref Range Status   Specimen Description BLOOD LEFT ANTECUBITAL  Final   Special Requests   Final    BOTTLES DRAWN AEROBIC AND ANAEROBIC Blood Culture adequate volume   Culture   Final    NO GROWTH 5 DAYS Performed at [facility], [address] [place], [place] [address]    Report Status 05/04/2023 FINAL  Final  Resp panel by RT-PCR (RSV, Flu A&B, Covid) Anterior Nasal Swab     Status: None   Collection Time: 04/29/23  7:52 AM   Specimen: Anterior Nasal Swab  Result Value Ref Range Status   SARS Coronavirus 2 by RT PCR NEGATIVE NEGATIVE Final    Comment: (NOTE) SARS-CoV-2 target nucleic acids are NOT DETECTED.  The SARS-CoV-2 RNA is generally detectable in upper  respiratory specimens during the acute phase of infection. The lowest concentration of SARS-CoV-2 viral copies this assay can detect is 138 copies/mL. A negative result does not preclude SARS-Cov-2 infection and should not be used as the sole basis for treatment or other patient management decisions. A negative result may occur with  improper specimen collection/handling, submission of specimen other than nasopharyngeal swab, presence of viral mutation(s) within the areas targeted by this assay, and inadequate number of viral copies(<138 copies/mL). A negative result must be combined with clinical observations, patient history, and epidemiological information. The expected result is Negative.  Fact Sheet for Patients:  bloggercourse.com  Fact Sheet for Healthcare Providers:  seriousbroker.it  This test is no t yet approved or cleared by the [place]  FDA  and  has been authorized for detection and/or diagnosis of SARS-CoV-2 by FDA under an Emergency Use Authorization (EUA). This EUA will remain  in effect (meaning this test can be used) for the duration of the COVID-19 declaration under Section 564(b)(1) of the Act, 21 U.S.C.section 360bbb-3(b)(1), unless the authorization is terminated  or revoked sooner.       Influenza A by PCR NEGATIVE NEGATIVE Final   Influenza B by PCR NEGATIVE NEGATIVE Final    Comment: (NOTE) The Xpert Xpress SARS-CoV-2/FLU/RSV plus assay is intended as an aid in the diagnosis of influenza from Nasopharyngeal swab specimens and should not be used as a sole basis for treatment. Nasal washings and aspirates are unacceptable for Xpert Xpress SARS-CoV-2/FLU/RSV testing.  Fact Sheet for Patients: bloggercourse.com  Fact Sheet for Healthcare Providers: seriousbroker.it  This test is not yet approved or cleared by the [place]  FDA and has been  authorized for detection and/or diagnosis of SARS-CoV-2 by FDA under an Emergency Use Authorization (EUA). This EUA will remain in effect (meaning this test can be used) for the duration of the COVID-19 declaration under Section 564(b)(1) of the Act, 21 U.S.C. section 360bbb-3(b)(1), unless the authorization is terminated or revoked.     Resp Syncytial Virus by PCR NEGATIVE NEGATIVE Final    Comment: (NOTE) Fact Sheet for Patients: bloggercourse.com  Fact Sheet for Healthcare Providers: seriousbroker.it  This test is not yet approved or cleared by the [place]  FDA and has been authorized for detection and/or diagnosis of SARS-CoV-2 by FDA under an Emergency Use Authorization (EUA). This EUA will remain in effect (meaning this test can be used) for the duration of the COVID-19 declaration under Section 564(b)(1) of the Act, 21 U.S.C. section 360bbb-3(b)(1), unless the authorization is terminated or revoked.  Performed at [facility], [address] [place], [place] [address]   Gastrointestinal Panel by PCR , Stool     Status: Abnormal   Collection Time: 04/29/23  9:37 PM   Specimen: STOOL  Result Value Ref Range Status   Campylobacter species NOT DETECTED NOT DETECTED Final   Plesimonas shigelloides NOT DETECTED NOT DETECTED Final   Salmonella species NOT DETECTED NOT DETECTED Final   Yersinia enterocolitica NOT DETECTED NOT DETECTED Final   Vibrio species NOT DETECTED NOT DETECTED Final   Vibrio cholerae NOT DETECTED NOT DETECTED Final   Enteroaggregative E coli (EAEC) NOT DETECTED NOT DETECTED Final   Enteropathogenic E coli (EPEC) NOT DETECTED NOT DETECTED Final   Enterotoxigenic E coli (ETEC) NOT DETECTED NOT DETECTED Final   Shiga like toxin producing E coli (STEC) NOT DETECTED NOT DETECTED Final   Shigella/Enteroinvasive E coli (EIEC) NOT DETECTED NOT DETECTED Final   Cryptosporidium NOT DETECTED NOT  DETECTED Final   Cyclospora cayetanensis NOT DETECTED NOT DETECTED Final   Entamoeba histolytica NOT DETECTED NOT DETECTED Final   Giardia lamblia NOT DETECTED NOT DETECTED Final   Adenovirus F40/41 NOT DETECTED NOT DETECTED Final   Astrovirus NOT DETECTED NOT DETECTED Final   Norovirus GI/GII DETECTED (A) NOT DETECTED Final    Comment: RESULT CALLED TO, READ BACK BY AND VERIFIED WITH:  JENNIFER ZUREK AT 2350 04/29/23 JG    Rotavirus A NOT DETECTED NOT DETECTED Final   Sapovirus (I, II, IV, and V) NOT DETECTED NOT DETECTED Final    Comment: Performed at [facility], [address], [place], [place] [address]  C Difficile Quick Screen w PCR reflex     Status: None   Collection  Time: 04/29/23  9:37 PM   Specimen: STOOL  Result Value Ref Range Status   C Diff antigen NEGATIVE NEGATIVE Final   C Diff toxin NEGATIVE NEGATIVE Final   C Diff interpretation No C. difficile detected.  Final    Comment: Performed at [facility], [address] [place], [place] [address]     Labs: CBC: Recent Labs  Lab 04/29/23 0543 04/30/23 1454 05/01/23 0457 05/02/23 0439 05/03/23 0548 05/04/23 0538  WBC 12.8* 7.7 5.4 4.9 3.8* 3.6*  NEUTROABS 11.8*  --   --   --   --   --   HGB 15.2 13.0 13.1 13.1 12.8* 12.7*  HCT 45.0 38.3* 38.2* 37.6* 37.4* 36.9*  MCV 95.9 93.2 94.3 91.3 93.3 92.7  PLT 159 137* 128* 147* 154 166   Basic Metabolic Panel: Recent Labs  Lab 04/30/23 1454 05/01/23 0457 05/02/23 0439 05/03/23 0548 05/04/23 0538  NA 136 138 138 141 139  K 3.3* 3.4* 3.7 3.2* 3.7  CL 101 101 100 104 104  CO2 25 27 27 27 25   GLUCOSE 129* 97 121* 85 88  BUN 17 14 10  7* 8  CREATININE 0.72 0.63 0.71 0.66 0.67  CALCIUM 8.5* 8.7* 8.4* 8.6* 8.7*  MG 2.1 2.1 2.0 1.9 2.0  PHOS 2.9 2.4* 2.3* 3.2 3.1   Liver Function Tests: Recent Labs  Lab 04/29/23 0543  AST 22  ALT 15  ALKPHOS 59  BILITOT 1.5*  PROT 7.3  ALBUMIN 4.0    CBG: Recent Labs  Lab 05/04/23 1155  05/04/23 1653 05/04/23 2232 05/05/23 0754 05/05/23 1129  GLUCAP 126* 119* 111* 96 130*    Time spent: 35 minutes  Signed:  Joette Pebbles  [facility] 05/05/2023 12:59 PM   Addendum 9:15 AM on 05/06/2023 Patient seen and examined at bedside this morning with no complaints. No acute overnight events. Patient cleared for discharge

## 2023-05-06 DIAGNOSIS — L570 Actinic keratosis: Secondary | ICD-10-CM | POA: Diagnosis not present

## 2023-05-06 DIAGNOSIS — E876 Hypokalemia: Secondary | ICD-10-CM | POA: Diagnosis not present

## 2023-05-06 DIAGNOSIS — L0109 Other impetigo: Secondary | ICD-10-CM | POA: Diagnosis not present

## 2023-05-06 DIAGNOSIS — A084 Viral intestinal infection, unspecified: Secondary | ICD-10-CM | POA: Diagnosis not present

## 2023-05-06 DIAGNOSIS — R531 Weakness: Secondary | ICD-10-CM | POA: Diagnosis not present

## 2023-05-06 DIAGNOSIS — L0889 Other specified local infections of the skin and subcutaneous tissue: Secondary | ICD-10-CM | POA: Diagnosis not present

## 2023-05-06 DIAGNOSIS — D044 Carcinoma in situ of skin of scalp and neck: Secondary | ICD-10-CM | POA: Diagnosis not present

## 2023-05-06 DIAGNOSIS — K668 Other specified disorders of peritoneum: Secondary | ICD-10-CM | POA: Diagnosis not present

## 2023-05-06 DIAGNOSIS — R2681 Unsteadiness on feet: Secondary | ICD-10-CM | POA: Diagnosis not present

## 2023-05-06 DIAGNOSIS — I10 Essential (primary) hypertension: Secondary | ICD-10-CM | POA: Diagnosis not present

## 2023-05-06 DIAGNOSIS — Z85828 Personal history of other malignant neoplasm of skin: Secondary | ICD-10-CM | POA: Diagnosis not present

## 2023-05-06 DIAGNOSIS — M6259 Muscle wasting and atrophy, not elsewhere classified, multiple sites: Secondary | ICD-10-CM | POA: Diagnosis not present

## 2023-05-06 DIAGNOSIS — R262 Difficulty in walking, not elsewhere classified: Secondary | ICD-10-CM | POA: Diagnosis not present

## 2023-05-06 DIAGNOSIS — M6281 Muscle weakness (generalized): Secondary | ICD-10-CM | POA: Diagnosis not present

## 2023-05-06 DIAGNOSIS — K219 Gastro-esophageal reflux disease without esophagitis: Secondary | ICD-10-CM | POA: Diagnosis not present

## 2023-05-06 DIAGNOSIS — R278 Other lack of coordination: Secondary | ICD-10-CM | POA: Diagnosis not present

## 2023-05-06 DIAGNOSIS — A0811 Acute gastroenteropathy due to Norwalk agent: Secondary | ICD-10-CM | POA: Diagnosis not present

## 2023-05-06 DIAGNOSIS — E119 Type 2 diabetes mellitus without complications: Secondary | ICD-10-CM | POA: Diagnosis not present

## 2023-05-06 LAB — GLUCOSE, CAPILLARY: Glucose-Capillary: 135 mg/dL — ABNORMAL HIGH (ref 70–99)

## 2023-05-06 NOTE — Progress Notes (Signed)
 This nurse attempted to call report to [facility] twice with both attempts unsuccessful. Discharge packet to be given to patient and family member providing transport upon discharge.

## 2023-05-06 NOTE — Progress Notes (Signed)
 Patient resting in bed with eyes closed. No needs identified.

## 2023-05-06 NOTE — Plan of Care (Signed)
  Problem: Metabolic: Goal: Ability to maintain appropriate glucose levels will improve Outcome: Progressing   Problem: Safety: Goal: Ability to remain free from injury will improve Outcome: Progressing

## 2023-05-06 NOTE — TOC Transition Note (Signed)
 Transition of Care [facility]) - Discharge Note   Patient Details  Name: Brandon Clements MRN: [id number] Date of Birth: 09/08/1940  [facility] CM/SW Contact:  Ladene Lady, LCSW Phone Number: 05/06/2023, 9:46 AM   Clinical Narrative:   Pt has orders to discharge to [facility]. RN given number for report. DC summary sent. Family to transport. [facility] notified.     Final next level of care: Skilled Nursing Facility Barriers to Discharge: Barriers Resolved   Patient Goals and CMS Choice Patient states their goals for this hospitalization and ongoing recovery are:: Go to [email or web address] Compare Post Acute Care list provided to:: Patient Choice offered to / list presented to : Patient      Discharge Placement              Patient chooses bed at: [facility] Patient to be transferred to facility by: family Name of family member notified: kay Patient and family notified of of transfer: 05/06/23  Discharge Plan and Services Additional resources added to the After Visit Summary for       Post Acute Care Choice: Skilled Nursing Facility                               Social Drivers of Health (SDOH) Interventions SDOH Screenings   Food Insecurity: Food Insecurity Present (04/30/2023)  Housing: Low Risk  (04/30/2023)  Transportation Needs: No Transportation Needs (04/30/2023)  Utilities: Not At Risk (04/30/2023)  Social Connections: Moderately Isolated (04/30/2023)  Tobacco Use: High Risk (04/29/2023)     Readmission Risk Interventions     No data to display

## 2023-05-07 DIAGNOSIS — A0811 Acute gastroenteropathy due to Norwalk agent: Secondary | ICD-10-CM | POA: Diagnosis not present

## 2023-05-07 DIAGNOSIS — K219 Gastro-esophageal reflux disease without esophagitis: Secondary | ICD-10-CM | POA: Diagnosis not present

## 2023-05-07 DIAGNOSIS — K668 Other specified disorders of peritoneum: Secondary | ICD-10-CM | POA: Diagnosis not present

## 2023-05-07 DIAGNOSIS — E876 Hypokalemia: Secondary | ICD-10-CM | POA: Diagnosis not present

## 2023-05-07 DIAGNOSIS — E119 Type 2 diabetes mellitus without complications: Secondary | ICD-10-CM | POA: Diagnosis not present

## 2023-05-07 DIAGNOSIS — I10 Essential (primary) hypertension: Secondary | ICD-10-CM | POA: Diagnosis not present

## 2023-05-07 DIAGNOSIS — R531 Weakness: Secondary | ICD-10-CM | POA: Diagnosis not present

## 2023-05-08 DIAGNOSIS — A0811 Acute gastroenteropathy due to Norwalk agent: Secondary | ICD-10-CM | POA: Diagnosis not present

## 2023-05-08 DIAGNOSIS — K219 Gastro-esophageal reflux disease without esophagitis: Secondary | ICD-10-CM | POA: Diagnosis not present

## 2023-05-08 DIAGNOSIS — K668 Other specified disorders of peritoneum: Secondary | ICD-10-CM | POA: Diagnosis not present

## 2023-05-08 DIAGNOSIS — I10 Essential (primary) hypertension: Secondary | ICD-10-CM | POA: Diagnosis not present

## 2023-05-08 DIAGNOSIS — E119 Type 2 diabetes mellitus without complications: Secondary | ICD-10-CM | POA: Diagnosis not present

## 2023-05-08 DIAGNOSIS — E876 Hypokalemia: Secondary | ICD-10-CM | POA: Diagnosis not present

## 2023-05-08 DIAGNOSIS — R531 Weakness: Secondary | ICD-10-CM | POA: Diagnosis not present

## 2023-05-10 DIAGNOSIS — K668 Other specified disorders of peritoneum: Secondary | ICD-10-CM | POA: Diagnosis not present

## 2023-05-10 DIAGNOSIS — L570 Actinic keratosis: Secondary | ICD-10-CM | POA: Diagnosis not present

## 2023-05-10 DIAGNOSIS — D044 Carcinoma in situ of skin of scalp and neck: Secondary | ICD-10-CM | POA: Diagnosis not present

## 2023-05-10 DIAGNOSIS — I10 Essential (primary) hypertension: Secondary | ICD-10-CM | POA: Diagnosis not present

## 2023-05-10 DIAGNOSIS — L0889 Other specified local infections of the skin and subcutaneous tissue: Secondary | ICD-10-CM | POA: Diagnosis not present

## 2023-05-10 DIAGNOSIS — L0109 Other impetigo: Secondary | ICD-10-CM | POA: Diagnosis not present

## 2023-05-10 DIAGNOSIS — A0811 Acute gastroenteropathy due to Norwalk agent: Secondary | ICD-10-CM | POA: Diagnosis not present

## 2023-05-10 DIAGNOSIS — R531 Weakness: Secondary | ICD-10-CM | POA: Diagnosis not present

## 2023-05-10 DIAGNOSIS — E119 Type 2 diabetes mellitus without complications: Secondary | ICD-10-CM | POA: Diagnosis not present

## 2023-05-10 DIAGNOSIS — K219 Gastro-esophageal reflux disease without esophagitis: Secondary | ICD-10-CM | POA: Diagnosis not present

## 2023-05-10 DIAGNOSIS — E876 Hypokalemia: Secondary | ICD-10-CM | POA: Diagnosis not present

## 2023-05-10 DIAGNOSIS — Z85828 Personal history of other malignant neoplasm of skin: Secondary | ICD-10-CM | POA: Diagnosis not present

## 2023-05-14 DIAGNOSIS — R531 Weakness: Secondary | ICD-10-CM | POA: Diagnosis not present

## 2023-05-14 DIAGNOSIS — E119 Type 2 diabetes mellitus without complications: Secondary | ICD-10-CM | POA: Diagnosis not present

## 2023-05-14 DIAGNOSIS — A0811 Acute gastroenteropathy due to Norwalk agent: Secondary | ICD-10-CM | POA: Diagnosis not present

## 2023-05-14 DIAGNOSIS — K219 Gastro-esophageal reflux disease without esophagitis: Secondary | ICD-10-CM | POA: Diagnosis not present

## 2023-05-14 DIAGNOSIS — E876 Hypokalemia: Secondary | ICD-10-CM | POA: Diagnosis not present

## 2023-05-14 DIAGNOSIS — K668 Other specified disorders of peritoneum: Secondary | ICD-10-CM | POA: Diagnosis not present

## 2023-05-14 DIAGNOSIS — I10 Essential (primary) hypertension: Secondary | ICD-10-CM | POA: Diagnosis not present

## 2023-05-15 DIAGNOSIS — K668 Other specified disorders of peritoneum: Secondary | ICD-10-CM | POA: Diagnosis not present

## 2023-05-15 DIAGNOSIS — E119 Type 2 diabetes mellitus without complications: Secondary | ICD-10-CM | POA: Diagnosis not present

## 2023-05-15 DIAGNOSIS — I10 Essential (primary) hypertension: Secondary | ICD-10-CM | POA: Diagnosis not present

## 2023-05-15 DIAGNOSIS — A0811 Acute gastroenteropathy due to Norwalk agent: Secondary | ICD-10-CM | POA: Diagnosis not present

## 2023-05-15 DIAGNOSIS — E876 Hypokalemia: Secondary | ICD-10-CM | POA: Diagnosis not present

## 2023-05-15 DIAGNOSIS — R531 Weakness: Secondary | ICD-10-CM | POA: Diagnosis not present

## 2023-05-15 DIAGNOSIS — K219 Gastro-esophageal reflux disease without esophagitis: Secondary | ICD-10-CM | POA: Diagnosis not present

## 2023-05-17 DIAGNOSIS — Z79899 Other long term (current) drug therapy: Secondary | ICD-10-CM | POA: Diagnosis not present

## 2023-05-17 DIAGNOSIS — E782 Mixed hyperlipidemia: Secondary | ICD-10-CM | POA: Diagnosis not present

## 2023-05-17 DIAGNOSIS — E538 Deficiency of other specified B group vitamins: Secondary | ICD-10-CM | POA: Diagnosis not present

## 2023-05-17 DIAGNOSIS — E118 Type 2 diabetes mellitus with unspecified complications: Secondary | ICD-10-CM | POA: Diagnosis not present

## 2023-05-17 DIAGNOSIS — R531 Weakness: Secondary | ICD-10-CM | POA: Diagnosis not present

## 2023-05-17 DIAGNOSIS — R634 Abnormal weight loss: Secondary | ICD-10-CM | POA: Diagnosis not present

## 2023-05-17 DIAGNOSIS — A09 Infectious gastroenteritis and colitis, unspecified: Secondary | ICD-10-CM | POA: Diagnosis not present

## 2023-05-22 DIAGNOSIS — E876 Hypokalemia: Secondary | ICD-10-CM | POA: Diagnosis not present

## 2023-05-22 DIAGNOSIS — K668 Other specified disorders of peritoneum: Secondary | ICD-10-CM | POA: Diagnosis not present

## 2023-05-22 DIAGNOSIS — Z79899 Other long term (current) drug therapy: Secondary | ICD-10-CM | POA: Diagnosis not present

## 2023-05-22 DIAGNOSIS — Z9181 History of falling: Secondary | ICD-10-CM | POA: Diagnosis not present

## 2023-05-22 DIAGNOSIS — R531 Weakness: Secondary | ICD-10-CM | POA: Diagnosis not present

## 2023-05-22 DIAGNOSIS — R296 Repeated falls: Secondary | ICD-10-CM | POA: Diagnosis not present

## 2023-05-22 DIAGNOSIS — Z85828 Personal history of other malignant neoplasm of skin: Secondary | ICD-10-CM | POA: Diagnosis not present

## 2023-05-22 DIAGNOSIS — I2510 Atherosclerotic heart disease of native coronary artery without angina pectoris: Secondary | ICD-10-CM | POA: Diagnosis not present

## 2023-05-22 DIAGNOSIS — A0811 Acute gastroenteropathy due to Norwalk agent: Secondary | ICD-10-CM | POA: Diagnosis not present

## 2023-05-22 DIAGNOSIS — I10 Essential (primary) hypertension: Secondary | ICD-10-CM | POA: Diagnosis not present

## 2023-05-22 DIAGNOSIS — E1140 Type 2 diabetes mellitus with diabetic neuropathy, unspecified: Secondary | ICD-10-CM | POA: Diagnosis not present

## 2023-05-22 DIAGNOSIS — K219 Gastro-esophageal reflux disease without esophagitis: Secondary | ICD-10-CM | POA: Diagnosis not present

## 2023-05-23 DIAGNOSIS — Z79899 Other long term (current) drug therapy: Secondary | ICD-10-CM | POA: Diagnosis not present

## 2023-05-24 DIAGNOSIS — Z79899 Other long term (current) drug therapy: Secondary | ICD-10-CM | POA: Diagnosis not present

## 2023-06-04 DIAGNOSIS — E538 Deficiency of other specified B group vitamins: Secondary | ICD-10-CM | POA: Diagnosis not present

## 2023-06-04 DIAGNOSIS — E118 Type 2 diabetes mellitus with unspecified complications: Secondary | ICD-10-CM | POA: Diagnosis not present

## 2023-06-04 DIAGNOSIS — Z79899 Other long term (current) drug therapy: Secondary | ICD-10-CM | POA: Diagnosis not present

## 2023-06-04 DIAGNOSIS — I10 Essential (primary) hypertension: Secondary | ICD-10-CM | POA: Diagnosis not present

## 2023-06-04 DIAGNOSIS — E782 Mixed hyperlipidemia: Secondary | ICD-10-CM | POA: Diagnosis not present

## 2023-06-06 DIAGNOSIS — E118 Type 2 diabetes mellitus with unspecified complications: Secondary | ICD-10-CM | POA: Diagnosis not present

## 2023-06-06 DIAGNOSIS — E782 Mixed hyperlipidemia: Secondary | ICD-10-CM | POA: Diagnosis not present

## 2023-06-06 DIAGNOSIS — E538 Deficiency of other specified B group vitamins: Secondary | ICD-10-CM | POA: Diagnosis not present

## 2023-06-06 DIAGNOSIS — Z79899 Other long term (current) drug therapy: Secondary | ICD-10-CM | POA: Diagnosis not present

## 2023-06-06 DIAGNOSIS — I10 Essential (primary) hypertension: Secondary | ICD-10-CM | POA: Diagnosis not present

## 2023-06-14 DIAGNOSIS — H4322 Crystalline deposits in vitreous body, left eye: Secondary | ICD-10-CM | POA: Diagnosis not present

## 2023-06-14 DIAGNOSIS — H02155 Paralytic ectropion of left lower eyelid: Secondary | ICD-10-CM | POA: Diagnosis not present

## 2023-06-14 DIAGNOSIS — E119 Type 2 diabetes mellitus without complications: Secondary | ICD-10-CM | POA: Diagnosis not present

## 2023-06-14 DIAGNOSIS — Z7984 Long term (current) use of oral hypoglycemic drugs: Secondary | ICD-10-CM | POA: Diagnosis not present

## 2023-06-14 DIAGNOSIS — H43813 Vitreous degeneration, bilateral: Secondary | ICD-10-CM | POA: Diagnosis not present

## 2023-06-14 DIAGNOSIS — H35373 Puckering of macula, bilateral: Secondary | ICD-10-CM | POA: Diagnosis not present

## 2023-06-14 DIAGNOSIS — H401131 Primary open-angle glaucoma, bilateral, mild stage: Secondary | ICD-10-CM | POA: Diagnosis not present

## 2023-06-25 DIAGNOSIS — A0811 Acute gastroenteropathy due to Norwalk agent: Secondary | ICD-10-CM | POA: Diagnosis not present

## 2023-06-25 DIAGNOSIS — I2510 Atherosclerotic heart disease of native coronary artery without angina pectoris: Secondary | ICD-10-CM | POA: Diagnosis not present

## 2023-06-25 DIAGNOSIS — I10 Essential (primary) hypertension: Secondary | ICD-10-CM | POA: Diagnosis not present

## 2023-06-25 DIAGNOSIS — E1140 Type 2 diabetes mellitus with diabetic neuropathy, unspecified: Secondary | ICD-10-CM | POA: Diagnosis not present

## 2023-06-25 DIAGNOSIS — E875 Hyperkalemia: Secondary | ICD-10-CM | POA: Diagnosis not present

## 2023-07-04 DIAGNOSIS — I10 Essential (primary) hypertension: Secondary | ICD-10-CM | POA: Diagnosis not present

## 2023-07-04 DIAGNOSIS — E782 Mixed hyperlipidemia: Secondary | ICD-10-CM | POA: Diagnosis not present

## 2023-07-04 DIAGNOSIS — I2510 Atherosclerotic heart disease of native coronary artery without angina pectoris: Secondary | ICD-10-CM | POA: Diagnosis not present

## 2023-07-04 DIAGNOSIS — Z23 Encounter for immunization: Secondary | ICD-10-CM | POA: Diagnosis not present

## 2023-07-04 DIAGNOSIS — E118 Type 2 diabetes mellitus with unspecified complications: Secondary | ICD-10-CM | POA: Diagnosis not present

## 2023-07-17 DIAGNOSIS — H43813 Vitreous degeneration, bilateral: Secondary | ICD-10-CM | POA: Diagnosis not present

## 2023-07-17 DIAGNOSIS — E119 Type 2 diabetes mellitus without complications: Secondary | ICD-10-CM | POA: Diagnosis not present

## 2023-07-17 DIAGNOSIS — Z7984 Long term (current) use of oral hypoglycemic drugs: Secondary | ICD-10-CM | POA: Diagnosis not present

## 2023-07-17 DIAGNOSIS — H10233 Serous conjunctivitis, except viral, bilateral: Secondary | ICD-10-CM | POA: Diagnosis not present

## 2023-07-17 DIAGNOSIS — H02155 Paralytic ectropion of left lower eyelid: Secondary | ICD-10-CM | POA: Diagnosis not present

## 2023-07-17 DIAGNOSIS — H35373 Puckering of macula, bilateral: Secondary | ICD-10-CM | POA: Diagnosis not present

## 2023-07-17 DIAGNOSIS — H401131 Primary open-angle glaucoma, bilateral, mild stage: Secondary | ICD-10-CM | POA: Diagnosis not present

## 2023-07-30 DIAGNOSIS — K668 Other specified disorders of peritoneum: Secondary | ICD-10-CM | POA: Diagnosis not present

## 2023-07-30 DIAGNOSIS — R296 Repeated falls: Secondary | ICD-10-CM | POA: Diagnosis not present

## 2023-07-30 DIAGNOSIS — E1140 Type 2 diabetes mellitus with diabetic neuropathy, unspecified: Secondary | ICD-10-CM | POA: Diagnosis not present

## 2023-07-30 DIAGNOSIS — I2510 Atherosclerotic heart disease of native coronary artery without angina pectoris: Secondary | ICD-10-CM | POA: Diagnosis not present

## 2023-07-30 DIAGNOSIS — I10 Essential (primary) hypertension: Secondary | ICD-10-CM | POA: Diagnosis not present

## 2023-07-30 DIAGNOSIS — Z85828 Personal history of other malignant neoplasm of skin: Secondary | ICD-10-CM | POA: Diagnosis not present

## 2023-07-30 DIAGNOSIS — K219 Gastro-esophageal reflux disease without esophagitis: Secondary | ICD-10-CM | POA: Diagnosis not present

## 2023-08-14 DIAGNOSIS — L570 Actinic keratosis: Secondary | ICD-10-CM | POA: Diagnosis not present

## 2023-08-14 DIAGNOSIS — L578 Other skin changes due to chronic exposure to nonionizing radiation: Secondary | ICD-10-CM | POA: Diagnosis not present

## 2023-08-14 DIAGNOSIS — Z85828 Personal history of other malignant neoplasm of skin: Secondary | ICD-10-CM | POA: Diagnosis not present

## 2023-08-14 DIAGNOSIS — L814 Other melanin hyperpigmentation: Secondary | ICD-10-CM | POA: Diagnosis not present

## 2023-08-14 DIAGNOSIS — L821 Other seborrheic keratosis: Secondary | ICD-10-CM | POA: Diagnosis not present

## 2023-08-15 DIAGNOSIS — H35373 Puckering of macula, bilateral: Secondary | ICD-10-CM | POA: Diagnosis not present

## 2023-08-15 DIAGNOSIS — H02155 Paralytic ectropion of left lower eyelid: Secondary | ICD-10-CM | POA: Diagnosis not present

## 2023-08-15 DIAGNOSIS — H10233 Serous conjunctivitis, except viral, bilateral: Secondary | ICD-10-CM | POA: Diagnosis not present

## 2023-08-15 DIAGNOSIS — H43813 Vitreous degeneration, bilateral: Secondary | ICD-10-CM | POA: Diagnosis not present

## 2023-08-15 DIAGNOSIS — Z7984 Long term (current) use of oral hypoglycemic drugs: Secondary | ICD-10-CM | POA: Diagnosis not present

## 2023-08-15 DIAGNOSIS — H401131 Primary open-angle glaucoma, bilateral, mild stage: Secondary | ICD-10-CM | POA: Diagnosis not present

## 2023-08-15 DIAGNOSIS — E119 Type 2 diabetes mellitus without complications: Secondary | ICD-10-CM | POA: Diagnosis not present

## 2023-09-19 DIAGNOSIS — Z0000 Encounter for general adult medical examination without abnormal findings: Secondary | ICD-10-CM | POA: Diagnosis not present

## 2023-09-19 DIAGNOSIS — I10 Essential (primary) hypertension: Secondary | ICD-10-CM | POA: Diagnosis not present

## 2023-09-19 DIAGNOSIS — Z1331 Encounter for screening for depression: Secondary | ICD-10-CM | POA: Diagnosis not present

## 2023-09-19 DIAGNOSIS — Z79899 Other long term (current) drug therapy: Secondary | ICD-10-CM | POA: Diagnosis not present

## 2023-09-19 DIAGNOSIS — I2510 Atherosclerotic heart disease of native coronary artery without angina pectoris: Secondary | ICD-10-CM | POA: Diagnosis not present

## 2023-09-19 DIAGNOSIS — E538 Deficiency of other specified B group vitamins: Secondary | ICD-10-CM | POA: Diagnosis not present

## 2023-09-19 DIAGNOSIS — E782 Mixed hyperlipidemia: Secondary | ICD-10-CM | POA: Diagnosis not present

## 2023-09-19 DIAGNOSIS — Z125 Encounter for screening for malignant neoplasm of prostate: Secondary | ICD-10-CM | POA: Diagnosis not present

## 2023-09-19 DIAGNOSIS — E118 Type 2 diabetes mellitus with unspecified complications: Secondary | ICD-10-CM | POA: Diagnosis not present

## 2023-09-19 DIAGNOSIS — E559 Vitamin D deficiency, unspecified: Secondary | ICD-10-CM | POA: Diagnosis not present

## 2023-10-10 DIAGNOSIS — G608 Other hereditary and idiopathic neuropathies: Secondary | ICD-10-CM | POA: Diagnosis not present

## 2023-10-21 DIAGNOSIS — E119 Type 2 diabetes mellitus without complications: Secondary | ICD-10-CM | POA: Diagnosis not present

## 2023-10-21 DIAGNOSIS — H52223 Regular astigmatism, bilateral: Secondary | ICD-10-CM | POA: Diagnosis not present

## 2023-10-21 DIAGNOSIS — H5203 Hypermetropia, bilateral: Secondary | ICD-10-CM | POA: Diagnosis not present

## 2023-10-21 DIAGNOSIS — H524 Presbyopia: Secondary | ICD-10-CM | POA: Diagnosis not present

## 2023-10-21 DIAGNOSIS — H35373 Puckering of macula, bilateral: Secondary | ICD-10-CM | POA: Diagnosis not present

## 2023-10-21 DIAGNOSIS — H43813 Vitreous degeneration, bilateral: Secondary | ICD-10-CM | POA: Diagnosis not present

## 2023-10-21 DIAGNOSIS — H401131 Primary open-angle glaucoma, bilateral, mild stage: Secondary | ICD-10-CM | POA: Diagnosis not present

## 2023-10-21 DIAGNOSIS — Z7984 Long term (current) use of oral hypoglycemic drugs: Secondary | ICD-10-CM | POA: Diagnosis not present

## 2023-10-21 DIAGNOSIS — H02155 Paralytic ectropion of left lower eyelid: Secondary | ICD-10-CM | POA: Diagnosis not present

## 2023-12-11 DIAGNOSIS — L570 Actinic keratosis: Secondary | ICD-10-CM | POA: Diagnosis not present

## 2023-12-11 DIAGNOSIS — D044 Carcinoma in situ of skin of scalp and neck: Secondary | ICD-10-CM | POA: Diagnosis not present

## 2023-12-11 DIAGNOSIS — L309 Dermatitis, unspecified: Secondary | ICD-10-CM | POA: Diagnosis not present

## 2023-12-11 DIAGNOSIS — Z85828 Personal history of other malignant neoplasm of skin: Secondary | ICD-10-CM | POA: Diagnosis not present

## 2023-12-11 DIAGNOSIS — C44622 Squamous cell carcinoma of skin of right upper limb, including shoulder: Secondary | ICD-10-CM | POA: Diagnosis not present

## 2023-12-11 DIAGNOSIS — L0889 Other specified local infections of the skin and subcutaneous tissue: Secondary | ICD-10-CM | POA: Diagnosis not present

## 2023-12-19 DIAGNOSIS — I10 Essential (primary) hypertension: Secondary | ICD-10-CM | POA: Diagnosis not present

## 2023-12-19 DIAGNOSIS — E559 Vitamin D deficiency, unspecified: Secondary | ICD-10-CM | POA: Diagnosis not present

## 2023-12-19 DIAGNOSIS — Z79899 Other long term (current) drug therapy: Secondary | ICD-10-CM | POA: Diagnosis not present

## 2023-12-19 DIAGNOSIS — E538 Deficiency of other specified B group vitamins: Secondary | ICD-10-CM | POA: Diagnosis not present

## 2023-12-19 DIAGNOSIS — E782 Mixed hyperlipidemia: Secondary | ICD-10-CM | POA: Diagnosis not present

## 2023-12-19 DIAGNOSIS — E118 Type 2 diabetes mellitus with unspecified complications: Secondary | ICD-10-CM | POA: Diagnosis not present

## 2024-01-14 DIAGNOSIS — I10 Essential (primary) hypertension: Secondary | ICD-10-CM | POA: Diagnosis not present

## 2024-01-14 DIAGNOSIS — I2510 Atherosclerotic heart disease of native coronary artery without angina pectoris: Secondary | ICD-10-CM | POA: Diagnosis not present

## 2024-01-14 DIAGNOSIS — E118 Type 2 diabetes mellitus with unspecified complications: Secondary | ICD-10-CM | POA: Diagnosis not present

## 2024-01-14 DIAGNOSIS — E782 Mixed hyperlipidemia: Secondary | ICD-10-CM | POA: Diagnosis not present

## 2024-01-14 DIAGNOSIS — Z23 Encounter for immunization: Secondary | ICD-10-CM | POA: Diagnosis not present

## 2024-01-21 DIAGNOSIS — E118 Type 2 diabetes mellitus with unspecified complications: Secondary | ICD-10-CM | POA: Diagnosis not present

## 2024-01-21 DIAGNOSIS — E782 Mixed hyperlipidemia: Secondary | ICD-10-CM | POA: Diagnosis not present

## 2024-01-21 DIAGNOSIS — Z79899 Other long term (current) drug therapy: Secondary | ICD-10-CM | POA: Diagnosis not present

## 2024-01-21 DIAGNOSIS — E559 Vitamin D deficiency, unspecified: Secondary | ICD-10-CM | POA: Diagnosis not present

## 2024-01-21 DIAGNOSIS — E538 Deficiency of other specified B group vitamins: Secondary | ICD-10-CM | POA: Diagnosis not present

## 2024-01-31 DIAGNOSIS — I10 Essential (primary) hypertension: Secondary | ICD-10-CM | POA: Diagnosis not present

## 2024-02-21 DIAGNOSIS — H52223 Regular astigmatism, bilateral: Secondary | ICD-10-CM | POA: Diagnosis not present

## 2024-02-21 DIAGNOSIS — H35373 Puckering of macula, bilateral: Secondary | ICD-10-CM | POA: Diagnosis not present

## 2024-02-21 DIAGNOSIS — H401131 Primary open-angle glaucoma, bilateral, mild stage: Secondary | ICD-10-CM | POA: Diagnosis not present

## 2024-02-21 DIAGNOSIS — H524 Presbyopia: Secondary | ICD-10-CM | POA: Diagnosis not present

## 2024-02-21 DIAGNOSIS — H43813 Vitreous degeneration, bilateral: Secondary | ICD-10-CM | POA: Diagnosis not present

## 2024-02-21 DIAGNOSIS — H02155 Paralytic ectropion of left lower eyelid: Secondary | ICD-10-CM | POA: Diagnosis not present

## 2024-02-21 DIAGNOSIS — Z7984 Long term (current) use of oral hypoglycemic drugs: Secondary | ICD-10-CM | POA: Diagnosis not present

## 2024-02-21 DIAGNOSIS — E119 Type 2 diabetes mellitus without complications: Secondary | ICD-10-CM | POA: Diagnosis not present

## 2024-02-21 DIAGNOSIS — H5203 Hypermetropia, bilateral: Secondary | ICD-10-CM | POA: Diagnosis not present

## 2024-03-18 DIAGNOSIS — D1801 Hemangioma of skin and subcutaneous tissue: Secondary | ICD-10-CM | POA: Diagnosis not present

## 2024-03-18 DIAGNOSIS — D485 Neoplasm of uncertain behavior of skin: Secondary | ICD-10-CM | POA: Diagnosis not present

## 2024-03-18 DIAGNOSIS — L821 Other seborrheic keratosis: Secondary | ICD-10-CM | POA: Diagnosis not present

## 2024-03-18 DIAGNOSIS — Z85828 Personal history of other malignant neoplasm of skin: Secondary | ICD-10-CM | POA: Diagnosis not present

## 2024-03-18 DIAGNOSIS — D0461 Carcinoma in situ of skin of right upper limb, including shoulder: Secondary | ICD-10-CM | POA: Diagnosis not present

## 2024-03-18 DIAGNOSIS — L570 Actinic keratosis: Secondary | ICD-10-CM | POA: Diagnosis not present

## 2024-03-23 DIAGNOSIS — E559 Vitamin D deficiency, unspecified: Secondary | ICD-10-CM | POA: Diagnosis not present

## 2024-03-23 DIAGNOSIS — E118 Type 2 diabetes mellitus with unspecified complications: Secondary | ICD-10-CM | POA: Diagnosis not present

## 2024-03-23 DIAGNOSIS — I10 Essential (primary) hypertension: Secondary | ICD-10-CM | POA: Diagnosis not present

## 2024-03-23 DIAGNOSIS — E538 Deficiency of other specified B group vitamins: Secondary | ICD-10-CM | POA: Diagnosis not present

## 2024-03-23 DIAGNOSIS — E782 Mixed hyperlipidemia: Secondary | ICD-10-CM | POA: Diagnosis not present

## 2024-03-23 DIAGNOSIS — Z79899 Other long term (current) drug therapy: Secondary | ICD-10-CM | POA: Diagnosis not present
